# Patient Record
Sex: Female | Born: 1935 | Race: White | Hispanic: No | Smoking: Never smoker
Health system: Southern US, Community
[De-identification: ages and names within clinical notes are randomized; demographics above are authoritative.]

## PROBLEM LIST (undated history)

## (undated) DIAGNOSIS — R011 Cardiac murmur, unspecified: Secondary | ICD-10-CM

## (undated) DIAGNOSIS — J90 Pleural effusion, not elsewhere classified: Secondary | ICD-10-CM

## (undated) DIAGNOSIS — D649 Anemia, unspecified: Secondary | ICD-10-CM

## (undated) DIAGNOSIS — R0602 Shortness of breath: Secondary | ICD-10-CM

## (undated) DIAGNOSIS — I10 Essential (primary) hypertension: Secondary | ICD-10-CM

## (undated) DIAGNOSIS — I209 Angina pectoris, unspecified: Secondary | ICD-10-CM

## (undated) DIAGNOSIS — E785 Hyperlipidemia, unspecified: Secondary | ICD-10-CM

## (undated) DIAGNOSIS — M19049 Primary osteoarthritis, unspecified hand: Secondary | ICD-10-CM

## (undated) DIAGNOSIS — I2510 Atherosclerotic heart disease of native coronary artery without angina pectoris: Secondary | ICD-10-CM

## (undated) DIAGNOSIS — M8580 Other specified disorders of bone density and structure, unspecified site: Secondary | ICD-10-CM

## (undated) DIAGNOSIS — K219 Gastro-esophageal reflux disease without esophagitis: Secondary | ICD-10-CM

## (undated) HISTORY — DX: Anemia, unspecified: D64.9

## (undated) HISTORY — DX: Atherosclerotic heart disease of native coronary artery without angina pectoris: I25.10

## (undated) HISTORY — PX: BREAST CYST ASPIRATION: SHX578

## (undated) HISTORY — DX: Essential (primary) hypertension: I10

## (undated) HISTORY — DX: Other specified disorders of bone density and structure, unspecified site: M85.80

## (undated) HISTORY — PX: OTHER SURGICAL HISTORY: SHX169

## (undated) HISTORY — DX: Hyperlipidemia, unspecified: E78.5

## (undated) HISTORY — DX: Gastro-esophageal reflux disease without esophagitis: K21.9

## (undated) HISTORY — PX: CORONARY ANGIOPLASTY: SHX604

---

## 2000-06-10 HISTORY — PX: OTHER SURGICAL HISTORY: SHX169

## 2000-12-11 HISTORY — PX: COLONOSCOPY: SHX174

## 2002-10-11 HISTORY — PX: OTHER SURGICAL HISTORY: SHX169

## 2004-05-05 ENCOUNTER — Ambulatory Visit: Payer: Self-pay | Admitting: Family Medicine

## 2004-06-05 ENCOUNTER — Ambulatory Visit: Payer: Self-pay | Admitting: Family Medicine

## 2004-07-03 ENCOUNTER — Ambulatory Visit: Payer: Self-pay | Admitting: Family Medicine

## 2004-11-09 ENCOUNTER — Ambulatory Visit: Payer: Self-pay | Admitting: Family Medicine

## 2004-11-09 HISTORY — PX: OTHER SURGICAL HISTORY: SHX169

## 2004-12-25 ENCOUNTER — Ambulatory Visit: Payer: Self-pay | Admitting: Family Medicine

## 2004-12-28 ENCOUNTER — Ambulatory Visit: Payer: Self-pay | Admitting: Family Medicine

## 2005-01-12 ENCOUNTER — Ambulatory Visit: Payer: Self-pay | Admitting: Family Medicine

## 2005-01-19 ENCOUNTER — Ambulatory Visit: Payer: Self-pay | Admitting: Family Medicine

## 2005-02-18 ENCOUNTER — Ambulatory Visit: Payer: Self-pay | Admitting: Family Medicine

## 2005-03-15 ENCOUNTER — Ambulatory Visit: Payer: Self-pay | Admitting: Family Medicine

## 2005-03-23 ENCOUNTER — Ambulatory Visit: Payer: Self-pay | Admitting: Family Medicine

## 2005-08-17 ENCOUNTER — Ambulatory Visit: Payer: Self-pay | Admitting: Family Medicine

## 2005-09-16 ENCOUNTER — Ambulatory Visit: Payer: Self-pay | Admitting: Family Medicine

## 2005-10-27 ENCOUNTER — Ambulatory Visit: Payer: Self-pay | Admitting: Family Medicine

## 2005-12-14 ENCOUNTER — Ambulatory Visit: Payer: Self-pay | Admitting: Family Medicine

## 2005-12-16 ENCOUNTER — Ambulatory Visit: Payer: Self-pay | Admitting: Family Medicine

## 2006-01-27 ENCOUNTER — Ambulatory Visit: Payer: Self-pay | Admitting: Family Medicine

## 2006-03-29 ENCOUNTER — Ambulatory Visit: Payer: Self-pay | Admitting: Family Medicine

## 2006-03-31 ENCOUNTER — Ambulatory Visit: Payer: Self-pay | Admitting: Family Medicine

## 2006-07-04 ENCOUNTER — Ambulatory Visit: Payer: Self-pay | Admitting: Internal Medicine

## 2006-07-28 ENCOUNTER — Ambulatory Visit: Payer: Self-pay | Admitting: Family Medicine

## 2006-08-23 DIAGNOSIS — M899 Disorder of bone, unspecified: Secondary | ICD-10-CM | POA: Insufficient documentation

## 2006-08-23 DIAGNOSIS — M949 Disorder of cartilage, unspecified: Secondary | ICD-10-CM

## 2006-08-25 DIAGNOSIS — D649 Anemia, unspecified: Secondary | ICD-10-CM

## 2006-08-25 DIAGNOSIS — I10 Essential (primary) hypertension: Secondary | ICD-10-CM | POA: Insufficient documentation

## 2006-08-25 DIAGNOSIS — E785 Hyperlipidemia, unspecified: Secondary | ICD-10-CM | POA: Insufficient documentation

## 2006-08-25 DIAGNOSIS — M3500 Sicca syndrome, unspecified: Secondary | ICD-10-CM

## 2006-08-25 DIAGNOSIS — J309 Allergic rhinitis, unspecified: Secondary | ICD-10-CM | POA: Insufficient documentation

## 2006-08-25 DIAGNOSIS — I6529 Occlusion and stenosis of unspecified carotid artery: Secondary | ICD-10-CM | POA: Insufficient documentation

## 2006-08-26 ENCOUNTER — Ambulatory Visit: Payer: Self-pay | Admitting: Family Medicine

## 2006-08-26 LAB — CONVERTED CEMR LAB
Basophils Absolute: 0 10*3/uL (ref 0.0–0.1)
Basophils Relative: 0.4 % (ref 0.0–1.0)
Bilirubin, Direct: 0.1 mg/dL (ref 0.0–0.3)
CO2: 28 meq/L (ref 19–32)
Cholesterol: 160 mg/dL (ref 0–200)
Creatinine, Ser: 0.9 mg/dL (ref 0.4–1.2)
Glucose, Bld: 93 mg/dL (ref 70–99)
HCT: 34.2 % — ABNORMAL LOW (ref 36.0–46.0)
Hemoglobin: 11.9 g/dL — ABNORMAL LOW (ref 12.0–15.0)
LDL Cholesterol: 103 mg/dL — ABNORMAL HIGH (ref 0–99)
MCHC: 34.9 g/dL (ref 30.0–36.0)
Monocytes Absolute: 0.7 10*3/uL (ref 0.2–0.7)
Neutrophils Relative %: 51 % (ref 43.0–77.0)
Potassium: 4.2 meq/L (ref 3.5–5.1)
RDW: 13.8 % (ref 11.5–14.6)
Sed Rate: 46 mm/hr — ABNORMAL HIGH (ref 0–25)
Sodium: 142 meq/L (ref 135–145)
TSH: 4.45 microintl units/mL (ref 0.35–5.50)
Total Bilirubin: 0.5 mg/dL (ref 0.3–1.2)
Total Protein: 8.2 g/dL (ref 6.0–8.3)

## 2006-08-29 ENCOUNTER — Ambulatory Visit: Payer: Self-pay | Admitting: Family Medicine

## 2006-08-29 DIAGNOSIS — F411 Generalized anxiety disorder: Secondary | ICD-10-CM

## 2006-08-29 DIAGNOSIS — M329 Systemic lupus erythematosus, unspecified: Secondary | ICD-10-CM

## 2006-09-22 ENCOUNTER — Ambulatory Visit: Payer: Self-pay | Admitting: Family Medicine

## 2006-09-23 ENCOUNTER — Encounter (INDEPENDENT_AMBULATORY_CARE_PROVIDER_SITE_OTHER): Payer: Self-pay | Admitting: *Deleted

## 2006-10-24 ENCOUNTER — Ambulatory Visit: Payer: Self-pay | Admitting: Family Medicine

## 2006-10-24 LAB — CONVERTED CEMR LAB
Eosinophils Absolute: 0.1 10*3/uL (ref 0.0–0.6)
Folate: 9.6 ng/mL
Lymphocytes Relative: 34.2 % (ref 12.0–46.0)
MCV: 89.6 fL (ref 78.0–100.0)
Monocytes Absolute: 0.8 10*3/uL — ABNORMAL HIGH (ref 0.2–0.7)
Monocytes Relative: 10.8 % (ref 3.0–11.0)
Neutro Abs: 3.9 10*3/uL (ref 1.4–7.7)
Platelets: 180 10*3/uL (ref 150–400)
Vitamin B-12: 414 pg/mL (ref 211–911)

## 2006-11-23 ENCOUNTER — Encounter: Payer: Self-pay | Admitting: Internal Medicine

## 2006-12-06 ENCOUNTER — Telehealth: Payer: Self-pay | Admitting: Family Medicine

## 2006-12-30 ENCOUNTER — Encounter: Payer: Self-pay | Admitting: Family Medicine

## 2007-02-03 ENCOUNTER — Ambulatory Visit: Payer: Self-pay | Admitting: Family Medicine

## 2007-03-01 ENCOUNTER — Ambulatory Visit: Payer: Self-pay | Admitting: Family Medicine

## 2007-04-20 ENCOUNTER — Ambulatory Visit: Payer: Self-pay | Admitting: Family Medicine

## 2007-04-20 DIAGNOSIS — K219 Gastro-esophageal reflux disease without esophagitis: Secondary | ICD-10-CM | POA: Insufficient documentation

## 2007-05-12 ENCOUNTER — Ambulatory Visit: Payer: Self-pay | Admitting: Family Medicine

## 2007-05-25 ENCOUNTER — Ambulatory Visit: Payer: Self-pay | Admitting: Family Medicine

## 2007-08-01 ENCOUNTER — Ambulatory Visit: Payer: Self-pay | Admitting: Internal Medicine

## 2007-08-17 ENCOUNTER — Ambulatory Visit: Payer: Self-pay | Admitting: Family Medicine

## 2007-09-07 ENCOUNTER — Ambulatory Visit: Payer: Self-pay | Admitting: Family Medicine

## 2007-09-07 LAB — CONVERTED CEMR LAB
ALT: 10 units/L (ref 0–35)
AST: 22 units/L (ref 0–37)
Alkaline Phosphatase: 82 units/L (ref 39–117)
Basophils Absolute: 0 10*3/uL (ref 0.0–0.1)
Bilirubin, Direct: 0.1 mg/dL (ref 0.0–0.3)
CO2: 27 meq/L (ref 19–32)
Calcium: 9.2 mg/dL (ref 8.4–10.5)
Chloride: 107 meq/L (ref 96–112)
Glucose, Bld: 87 mg/dL (ref 70–99)
Hemoglobin: 9.8 g/dL — ABNORMAL LOW (ref 12.0–15.0)
LDL Cholesterol: 107 mg/dL — ABNORMAL HIGH (ref 0–99)
Lymphocytes Relative: 38 % (ref 12.0–46.0)
Monocytes Relative: 12.1 % — ABNORMAL HIGH (ref 3.0–12.0)
Neutro Abs: 2.2 10*3/uL (ref 1.4–7.7)
Neutrophils Relative %: 46.6 % (ref 43.0–77.0)
Potassium: 4.3 meq/L (ref 3.5–5.1)
RDW: 14.3 % (ref 11.5–14.6)
Sodium: 140 meq/L (ref 135–145)
Total Bilirubin: 0.4 mg/dL (ref 0.3–1.2)
Total CHOL/HDL Ratio: 7.1
Total Protein: 8.1 g/dL (ref 6.0–8.3)
Triglycerides: 94 mg/dL (ref 0–149)
VLDL: 19 mg/dL (ref 0–40)

## 2007-09-13 ENCOUNTER — Encounter: Payer: Self-pay | Admitting: Family Medicine

## 2007-09-13 ENCOUNTER — Ambulatory Visit: Payer: Self-pay | Admitting: Family Medicine

## 2007-09-13 ENCOUNTER — Other Ambulatory Visit: Admission: RE | Admit: 2007-09-13 | Discharge: 2007-09-13 | Payer: Self-pay | Admitting: Family Medicine

## 2007-09-13 DIAGNOSIS — D126 Benign neoplasm of colon, unspecified: Secondary | ICD-10-CM | POA: Insufficient documentation

## 2007-09-18 ENCOUNTER — Encounter (INDEPENDENT_AMBULATORY_CARE_PROVIDER_SITE_OTHER): Payer: Self-pay | Admitting: *Deleted

## 2007-10-04 ENCOUNTER — Encounter: Payer: Self-pay | Admitting: Family Medicine

## 2007-10-09 ENCOUNTER — Ambulatory Visit: Payer: Self-pay | Admitting: Family Medicine

## 2007-10-09 LAB — CONVERTED CEMR LAB: OCCULT 3: NEGATIVE

## 2007-10-10 ENCOUNTER — Encounter (INDEPENDENT_AMBULATORY_CARE_PROVIDER_SITE_OTHER): Payer: Self-pay | Admitting: *Deleted

## 2007-10-11 ENCOUNTER — Ambulatory Visit: Payer: Self-pay | Admitting: Oncology

## 2007-11-11 ENCOUNTER — Ambulatory Visit: Payer: Self-pay | Admitting: Oncology

## 2007-11-14 ENCOUNTER — Telehealth: Payer: Self-pay | Admitting: Family Medicine

## 2007-11-27 ENCOUNTER — Encounter: Payer: Self-pay | Admitting: Family Medicine

## 2007-12-12 ENCOUNTER — Ambulatory Visit: Payer: Self-pay | Admitting: Oncology

## 2008-01-22 ENCOUNTER — Telehealth: Payer: Self-pay | Admitting: Family Medicine

## 2008-01-23 ENCOUNTER — Encounter: Payer: Self-pay | Admitting: Family Medicine

## 2008-02-11 ENCOUNTER — Ambulatory Visit: Payer: Self-pay | Admitting: Oncology

## 2008-02-28 ENCOUNTER — Ambulatory Visit: Payer: Self-pay | Admitting: Oncology

## 2008-03-12 ENCOUNTER — Ambulatory Visit: Payer: Self-pay | Admitting: Oncology

## 2008-03-19 ENCOUNTER — Ambulatory Visit: Payer: Self-pay | Admitting: Family Medicine

## 2008-03-20 LAB — CONVERTED CEMR LAB: Vit D, 1,25-Dihydroxy: 41 (ref 30–89)

## 2008-03-21 ENCOUNTER — Ambulatory Visit: Payer: Self-pay | Admitting: Family Medicine

## 2008-05-16 ENCOUNTER — Ambulatory Visit: Payer: Self-pay | Admitting: Family Medicine

## 2008-05-16 DIAGNOSIS — R42 Dizziness and giddiness: Secondary | ICD-10-CM

## 2008-09-10 ENCOUNTER — Ambulatory Visit: Payer: Self-pay | Admitting: Oncology

## 2008-09-18 ENCOUNTER — Ambulatory Visit: Payer: Self-pay | Admitting: Oncology

## 2008-09-18 ENCOUNTER — Encounter: Payer: Self-pay | Admitting: Family Medicine

## 2008-10-09 ENCOUNTER — Telehealth (INDEPENDENT_AMBULATORY_CARE_PROVIDER_SITE_OTHER): Payer: Self-pay | Admitting: *Deleted

## 2008-10-10 ENCOUNTER — Ambulatory Visit: Payer: Self-pay | Admitting: Oncology

## 2008-10-15 ENCOUNTER — Ambulatory Visit: Payer: Self-pay | Admitting: Family Medicine

## 2008-10-15 LAB — CONVERTED CEMR LAB
Albumin: 3.8 g/dL (ref 3.5–5.2)
Alkaline Phosphatase: 56 units/L (ref 39–117)
BUN: 14 mg/dL (ref 6–23)
Basophils Absolute: 0 10*3/uL (ref 0.0–0.1)
CO2: 30 meq/L (ref 19–32)
Calcium: 9 mg/dL (ref 8.4–10.5)
Cholesterol: 164 mg/dL (ref 0–200)
Eosinophils Absolute: 0.1 10*3/uL (ref 0.0–0.7)
GFR calc non Af Amer: 72.79 mL/min (ref >60)
Glucose, Bld: 99 mg/dL (ref 70–99)
HDL: 37.5 mg/dL — ABNORMAL LOW (ref >39.00)
Hemoglobin: 12.7 g/dL (ref 12.0–15.0)
Iron: 78 ug/dL (ref 42–145)
Lymphocytes Relative: 36.1 % (ref 12.0–46.0)
MCHC: 34.8 g/dL (ref 30.0–36.0)
Magnesium: 2.3 mg/dL (ref 1.5–2.5)
Monocytes Relative: 11.6 % (ref 3.0–12.0)
Neutro Abs: 2.3 10*3/uL (ref 1.4–7.7)
Neutrophils Relative %: 50.1 % (ref 43.0–77.0)
RBC: 3.93 M/uL (ref 3.87–5.11)
RDW: 12.9 % (ref 11.5–14.6)
Sed Rate: 31 mm/hr — ABNORMAL HIGH (ref 0–22)
TSH: 3.92 microintl units/mL (ref 0.35–5.50)
Total Protein: 7.7 g/dL (ref 6.0–8.3)
Triglycerides: 104 mg/dL (ref 0.0–149.0)
VLDL: 20.8 mg/dL (ref 0.0–40.0)

## 2008-10-22 ENCOUNTER — Ambulatory Visit: Payer: Self-pay | Admitting: Family Medicine

## 2008-11-15 ENCOUNTER — Ambulatory Visit: Payer: Self-pay | Admitting: Family Medicine

## 2008-11-18 ENCOUNTER — Encounter (INDEPENDENT_AMBULATORY_CARE_PROVIDER_SITE_OTHER): Payer: Self-pay | Admitting: *Deleted

## 2009-01-21 ENCOUNTER — Encounter: Payer: Self-pay | Admitting: Family Medicine

## 2009-02-26 ENCOUNTER — Telehealth: Payer: Self-pay | Admitting: Family Medicine

## 2009-03-12 ENCOUNTER — Telehealth: Payer: Self-pay | Admitting: Family Medicine

## 2009-05-07 ENCOUNTER — Ambulatory Visit: Payer: Self-pay | Admitting: Family Medicine

## 2009-05-07 DIAGNOSIS — M545 Low back pain, unspecified: Secondary | ICD-10-CM | POA: Insufficient documentation

## 2009-05-07 DIAGNOSIS — M79609 Pain in unspecified limb: Secondary | ICD-10-CM

## 2009-05-07 DIAGNOSIS — K5900 Constipation, unspecified: Secondary | ICD-10-CM | POA: Insufficient documentation

## 2009-06-24 ENCOUNTER — Ambulatory Visit: Payer: Self-pay | Admitting: Family Medicine

## 2009-11-13 ENCOUNTER — Encounter (INDEPENDENT_AMBULATORY_CARE_PROVIDER_SITE_OTHER): Payer: Self-pay | Admitting: *Deleted

## 2010-05-12 NOTE — Assessment & Plan Note (Signed)
Summary: SORE SPOT ON LEG   Vital Signs:  Patient profile:   77 year old female Weight:      121.75 pounds Temp:     98.1 degrees F oral Pulse rate:   72 / minute Pulse rhythm:   regular BP sitting:   138 / 80  (left arm) Cuff size:   regular  Vitals Entered By: Sydell Axon LPN (June 24, 2009 2:17 PM) CC: Hit right leg on nightstand about 6 weeks ago, has sore spot on leg now and it hurts to walk   History of Present Illness: Pt here for having hit her right thigh 6 weeks ago hard on the edge of her nightstand causing bruising and sweelling of the area. She is now having discomfort, enough to preclude her doing her regular walking. She has no swelling of the leg per se and the lump has gone down but still bothers her, especially at night.  Problems Prior to Update: 1)  Constipation, Mild  (ICD-564.00) 2)  Leg Pain, Bilateral  (ICD-729.5) 3)  Back Pain, Lumbar  (ICD-724.2) 4)  Other Screening Mammogram  (ICD-V76.12) 5)  Vertigo  (ICD-780.4) 6)  Colonic Polyps, Adenomatous  (ICD-211.3) 7)  Gerd  (ICD-530.81) 8)  Screening For Malignannt Neoplasm, Site Nec  (ICD-V76.49) 9)  Anxiety Disorder, Generalized  (ICD-300.02) 10)  Sjogren's Syndrome  (ICD-710.2) 11)  Lupus Erythematosus, Discoid (CUTANEOUS)  (ICD-695.4) 12)  Systemic Lupus Erythematosus  (ICD-710.0) 13)  Raynaud's Disease (OLD RECORDS)  (ICD-443.0) 14)  Osteopenia  (ICD-733.90) 15)  Hypertension  (ICD-401.9) 16)  Hyperlipidemia  (ICD-272.4) 17)  Carotid Artery Disease  (ICD-433.10) 18)  Anemia-nos  (ICD-285.9) 19)  Allergic Rhinitis  (ICD-477.9)  Medications Prior to Update: 1)  Aspirin 325 Mg Tabs (Aspirin) .... Take One By Mouth Daily 2)  Norvasc 5 Mg Tabs (Amlodipine Besylate) .... Take One By Mouth Daily 3)  Pravachol 80 Mg Tabs (Pravastatin Sodium) .... Take One By Mouth At Bedtime 4)  Alprazolam 0.25 Mg Tabs (Alprazolam) .... Take One Half Tablet By Mouth Daily As Needed For Anxiety 5)  Meclizine Hcl 25 Mg  Chew (Meclizine Hcl) .... 1/2-1 Tab By Mouth Every 6 Hrs As Needed Dizziness.  Allergies: 1)  ! Zocor (Simvastatin) 2)  Fosamax (Alendronate Sodium)  Physical Exam  General:  Well-developed,well-nourished,in no acute distress; alert,appropriate and cooperative throughout examination Head:  Normocephalic and atraumatic without obvious abnormalities. No apparent alopecia or balding. Eyes:  Conjunctiva clear bilaterally. PERRLA<, EOMI, Vertical gaze causes dizziness, no obvious nystagmus noted. Horizontal gaze does not cause. Ears:  External ear exam shows no significant lesions or deformities.  Otoscopic examination reveals clear canals, tympanic membranes are intact bilaterally without bulging, retraction, inflammation or discharge. Hearing is grossly normal bilaterally. Nose:  External nasal examination shows no deformity or inflammation. Nasal mucosa are pink and moist without lesions or exudates. Mouth:  Oral mucosa and oropharynx without lesions or exudates.  Teeth in good repair. Neck:  No deformities, masses, or tenderness noted. Lungs:  Normal respiratory effort, chest expands symmetrically. Lungs are clear to auscultation, no crackles or wheezes. Heart:  Normal rate and regular rhythm. S1 and S2 normal without gallop, murmur, click, rub or other extra sounds. Extremities:  Right ant thigh signif for soft 1x3cm soft tissue density that is extremely tender to palpation. No swelling of the leg. ROM nml.   Impression & Recommendations:  Problem # 1:  CONTUSION, RIGHT ANTERIOR THIGH (ICD-924.5) Assessment New Feels to be resolving hematoma.  Apply heat as  often as poss. Start walking again regularly as able.  Tyl ES, 2 tabs by mouth AM and before bed, may add third time if needed. Apply icy hot when not using heat. RTC if doesn't cont to improve for XRay.  Complete Medication List: 1)  Aspirin 325 Mg Tabs (Aspirin) .... Take one by mouth daily 2)  Norvasc 5 Mg Tabs (Amlodipine  besylate) .... Take one by mouth daily 3)  Pravachol 80 Mg Tabs (Pravastatin sodium) .... Take one by mouth at bedtime 4)  Alprazolam 0.25 Mg Tabs (Alprazolam) .... Take one half tablet by mouth daily as needed for anxiety 5)  Meclizine Hcl 25 Mg Chew (Meclizine hcl) .... 1/2-1 tab by mouth every 6 hrs as needed dizziness.  Current Allergies (reviewed today): ! ZOCOR (SIMVASTATIN) FOSAMAX (ALENDRONATE SODIUM)

## 2010-05-12 NOTE — Assessment & Plan Note (Signed)
Summary: ? ABOUT CHOL MEDS LEG PAIN/RBH   Vital Signs:  Patient profile:   77 year old female Weight:      120 pounds Temp:     98.2 degrees F oral Pulse rate:   72 / minute Pulse rhythm:   regular BP sitting:   122 / 78  (left arm) Cuff size:   regular  Vitals Entered By: Sydell Axon LPN (May 07, 2009 2:02 PM) CC: ? About cholesterol medication may be causing leg pain   CC:  ? About cholesterol medication may be causing leg pain.  History of Present Illness: Pt has recent onset LBP across from right to left laterally at the level of the sacrum, in the distal posterior upper legs and in the ant prox lower legs in the last month. She has arthritis but is also on Pravastatin and wonders what could be causing her discomfort. From the last two locations of the three, I doubt it to be arthritis but is possible.  She also has had acute episode of constipation and used Prune juice with success this AM. She wondered if this was a problem and are there other products I would suggest.  Problems Prior to Update: 1)  Other Screening Mammogram  (ICD-V76.12) 2)  Vertigo  (ICD-780.4) 3)  Colonic Polyps, Adenomatous  (ICD-211.3) 4)  Gerd  (ICD-530.81) 5)  Screening For Malignannt Neoplasm, Site Nec  (ICD-V76.49) 6)  Anxiety Disorder, Generalized  (ICD-300.02) 7)  Sjogren's Syndrome  (ICD-710.2) 8)  Lupus Erythematosus, Discoid (CUTANEOUS)  (ICD-695.4) 9)  Systemic Lupus Erythematosus  (ICD-710.0) 10)  Raynaud's Disease (OLD RECORDS)  (ICD-443.0) 11)  Osteopenia  (ICD-733.90) 12)  Hypertension  (ICD-401.9) 13)  Hyperlipidemia  (ICD-272.4) 14)  Carotid Artery Disease  (ICD-433.10) 15)  Anemia-nos  (ICD-285.9) 16)  Allergic Rhinitis  (ICD-477.9)  Medications Prior to Update: 1)  Aspirin 325 Mg Tabs (Aspirin) .... Take One By Mouth Daily 2)  Norvasc 5 Mg Tabs (Amlodipine Besylate) .... Take One By Mouth Daily 3)  Pravachol 80 Mg Tabs (Pravastatin Sodium) .... Take One By Mouth Q Hs 4)   Alprazolam 0.25 Mg Tabs (Alprazolam) .... Take One Half Tablet By Mouth Daily As Needed For Anxiety 5)  Meclizine Hcl 25 Mg Chew (Meclizine Hcl) .... 1/2-1 Tab By Mouth Every 6 Hrs As Needed Dizziness.  Allergies: 1)  ! Zocor (Simvastatin) 2)  Fosamax (Alendronate Sodium)  Physical Exam  General:  Well-developed,well-nourished,in no acute distress; alert,appropriate and cooperative throughout examination Head:  Normocephalic and atraumatic without obvious abnormalities. No apparent alopecia or balding. Eyes:  Conjunctiva clear bilaterally. PERRLA<, EOMI, Vertical gaze causes dizziness, no obvious nystagmus noted. Horizontal gaze does not cause. Ears:  External ear exam shows no significant lesions or deformities.  Otoscopic examination reveals clear canals, tympanic membranes are intact bilaterally without bulging, retraction, inflammation or discharge. Hearing is grossly normal bilaterally. Nose:  External nasal examination shows no deformity or inflammation. Nasal mucosa are pink and moist without lesions or exudates. Mouth:  Oral mucosa and oropharynx without lesions or exudates.  Teeth in good repair. Msk:  ROM of waist, giat and up and out of chair all reasonably nml.   Impression & Recommendations:  Problem # 1:  BACK PAIN, LUMBAR (ICD-724.2) Assessment New  See below Her updated medication list for this problem includes:    Aspirin 325 Mg Tabs (Aspirin) .Marland Kitchen... Take one by mouth daily  Problem # 2:  LEG PAIN, BILATERAL (ICD-729.5) Assessment: New Might be Prava side effect. Stop Prava  fort one month. If pain resolves, come in to discuss changing med or restarting Prave w/ Co Q 10. If does not change, probably arthritis of some form and would restart Prava.  Problem # 3:  CONSTIPATION, MILD (ICD-564.00) Assessment: New Prune Juice fine as long as not used regularly. If happens frequently, would start regular fiber supplementation daily...suggest Citrucel w/ 8 oz  water.  Complete Medication List: 1)  Aspirin 325 Mg Tabs (Aspirin) .... Take one by mouth daily 2)  Norvasc 5 Mg Tabs (Amlodipine besylate) .... Take one by mouth daily 3)  Pravachol 80 Mg Tabs (Pravastatin sodium) .... Take one by mouth at bedtime 4)  Alprazolam 0.25 Mg Tabs (Alprazolam) .... Take one half tablet by mouth daily as needed for anxiety 5)  Meclizine Hcl 25 Mg Chew (Meclizine hcl) .... 1/2-1 tab by mouth every 6 hrs as needed dizziness.  Patient Instructions: 1)  RTC as discussed.  Current Allergies (reviewed today): ! ZOCOR (SIMVASTATIN) FOSAMAX (ALENDRONATE SODIUM)

## 2010-05-12 NOTE — Letter (Signed)
Summary: Nadara Eaton letter  [place] at [facility]  [address] [place], [place] [address]   Phone: [phone]  Fax: [phone]       11/13/2009 MRN: [id number]  [facility] [address] [place], [place]  [address]  Dear Ms. Geathers,  [facility] [place], and [place] announce the retirement of Arta Silence, M.D., from full-time practice at the [facility] office effective October 09, 2009 and his plans of returning part-time.  It is important to Dr. Hetty Ely and to our practice that you understand that [facility] has seven physicians in our office for your health care needs.  We will continue to offer the same exceptional care that you have today.    Dr. Hetty Ely has spoken to many of you about his plans for retirement and returning part-time in the fall.   We will continue to work with you through the transition to schedule appointments for you in the office and meet the high standards that [place] is committed to.   Again, it is with great pleasure that we share the news that Dr. Hetty Ely will return to [facility] in October of 2011 with a reduced schedule.    If you have any questions, or would like to request an appointment with one of our physicians, please call us at [phone] and press the option for Scheduling an appointment.  We take pleasure in providing you with excellent patient care and look forward to seeing you at your next office visit.  Our [facility] Physicians are:  Tillman Abide, M.D. Laurita Quint, M.D. Roxy Manns, M.D. Kerby Nora, M.D. Hannah Beat, M.D. Ruthe Mannan, M.D. We proudly welcomed Raechel Ache, M.D. and Eustaquio Boyden, M.D. to the practice in July/August 2011.  Sincerely,  [facility]

## 2010-05-12 NOTE — Letter (Signed)
Summary: Schaller Retirement letter  [facility] [place]  [address]   [place], [place] [address]   Phone: [phone]  Fax: [phone]       11/13/2009 MRN: [id number]  [facility] Herbold [address] [place], [place]  [address]  Dear Ms. Ovens,  [facility] [place], and [place] announce the retirement of ROBERT N. SCHALLER, M.D., from full-time practice at the [place] office effective October 09, 2009 and his plans of returning part-time.  It is important to Dr. Schaller and to our practice that you understand that [facility] [place] has seven physicians in our office for your health care needs.  We will continue to offer the same exceptional care that you have today.    Dr. Schaller has spoken to many of you about his plans for retirement and returning part-time in the fall.   We will continue to work with you through the transition to schedule appointments for you in the office and meet the high standards that [facility] is committed to.   Again, it is with great pleasure that we share the news that Dr. Schaller will return to [facility] [place] in October of 2011 with a reduced schedule.    If you have any questions, or would like to request an appointment with one of our physicians, please call us at [phone] and press the option for Scheduling an appointment.  We take pleasure in providing you with excellent patient care and look forward to seeing you at your next office visit.  Our [place] Physicians are:  Richard Letvak, M.D. Robert Schaller, M.D. Marne Tower, M.D. Amy Bedsole, M.D. Spencer Copland, M.D. Talia Aron, M.D. We proudly welcomed Shaw Duncan, M.D. and Javier Gutierrez, M.D. to the practice in July/August 2011.  Sincerely,  [facility] [place] 

## 2010-11-24 ENCOUNTER — Other Ambulatory Visit: Payer: Self-pay | Admitting: *Deleted

## 2010-11-24 MED ORDER — AMLODIPINE BESYLATE 5 MG PO TABS: 5.0000 mg | ORAL_TABLET | Freq: Every day | ORAL | Status: DC

## 2011-03-31 ENCOUNTER — Observation Stay: Payer: Self-pay | Admitting: Internal Medicine

## 2011-04-01 ENCOUNTER — Encounter: Payer: Self-pay | Admitting: Family Medicine

## 2011-04-02 ENCOUNTER — Emergency Department: Payer: Self-pay | Admitting: Emergency Medicine

## 2011-04-07 ENCOUNTER — Ambulatory Visit: Payer: Self-pay | Admitting: Family Medicine

## 2011-04-07 DIAGNOSIS — Z0289 Encounter for other administrative examinations: Secondary | ICD-10-CM

## 2011-07-06 ENCOUNTER — Emergency Department: Payer: Self-pay | Admitting: Emergency Medicine

## 2011-07-06 LAB — CBC
MCHC: 33.5 g/dL (ref 32.0–36.0)
MCV: 91 fL (ref 80–100)
WBC: 2.7 10*3/uL — ABNORMAL LOW (ref 3.6–11.0)

## 2011-07-06 LAB — COMPREHENSIVE METABOLIC PANEL
BUN: 22 mg/dL — ABNORMAL HIGH (ref 7–18)
Calcium, Total: 8.6 mg/dL (ref 8.5–10.1)
Co2: 22 mmol/L (ref 21–32)
Glucose: 104 mg/dL — ABNORMAL HIGH (ref 65–99)
SGPT (ALT): 36 U/L
Total Protein: 8.4 g/dL — ABNORMAL HIGH (ref 6.4–8.2)

## 2011-07-06 LAB — URINALYSIS, COMPLETE
Blood: NEGATIVE
Ph: 6 (ref 4.5–8.0)
Squamous Epithelial: <1
WBC UR: 2 /HPF (ref 0–5)

## 2012-06-01 ENCOUNTER — Ambulatory Visit: Payer: Self-pay | Admitting: Cardiology

## 2012-06-06 ENCOUNTER — Encounter (HOSPITAL_COMMUNITY): Payer: Self-pay | Admitting: General Practice

## 2012-06-06 ENCOUNTER — Inpatient Hospital Stay (HOSPITAL_COMMUNITY): Payer: Medicare Other

## 2012-06-06 ENCOUNTER — Observation Stay: Payer: Self-pay | Admitting: Internal Medicine

## 2012-06-06 ENCOUNTER — Inpatient Hospital Stay (HOSPITAL_COMMUNITY)
Admission: AD | Admit: 2012-06-06 | Discharge: 2012-06-14 | DRG: 236 | Disposition: A | Discharge status: Home / Self Care | Payer: Medicare Other | Source: Other Acute Inpatient Hospital | Attending: Thoracic Surgery (Cardiothoracic Vascular Surgery) | Admitting: Thoracic Surgery (Cardiothoracic Vascular Surgery)

## 2012-06-06 ENCOUNTER — Other Ambulatory Visit: Payer: Self-pay | Admitting: *Deleted

## 2012-06-06 DIAGNOSIS — M899 Disorder of bone, unspecified: Secondary | ICD-10-CM | POA: Diagnosis present

## 2012-06-06 DIAGNOSIS — I2510 Atherosclerotic heart disease of native coronary artery without angina pectoris: Principal | ICD-10-CM | POA: Diagnosis present

## 2012-06-06 DIAGNOSIS — F05 Delirium due to known physiological condition: Secondary | ICD-10-CM | POA: Diagnosis not present

## 2012-06-06 DIAGNOSIS — K5900 Constipation, unspecified: Secondary | ICD-10-CM | POA: Diagnosis present

## 2012-06-06 DIAGNOSIS — N189 Chronic kidney disease, unspecified: Secondary | ICD-10-CM | POA: Diagnosis present

## 2012-06-06 DIAGNOSIS — I519 Heart disease, unspecified: Secondary | ICD-10-CM | POA: Diagnosis not present

## 2012-06-06 DIAGNOSIS — Z79899 Other long term (current) drug therapy: Secondary | ICD-10-CM

## 2012-06-06 DIAGNOSIS — J9819 Other pulmonary collapse: Secondary | ICD-10-CM | POA: Diagnosis not present

## 2012-06-06 DIAGNOSIS — E876 Hypokalemia: Secondary | ICD-10-CM | POA: Diagnosis not present

## 2012-06-06 DIAGNOSIS — I129 Hypertensive chronic kidney disease with stage 1 through stage 4 chronic kidney disease, or unspecified chronic kidney disease: Secondary | ICD-10-CM | POA: Diagnosis present

## 2012-06-06 DIAGNOSIS — D62 Acute posthemorrhagic anemia: Secondary | ICD-10-CM | POA: Diagnosis not present

## 2012-06-06 DIAGNOSIS — M329 Systemic lupus erythematosus, unspecified: Secondary | ICD-10-CM | POA: Diagnosis present

## 2012-06-06 DIAGNOSIS — I200 Unstable angina: Secondary | ICD-10-CM | POA: Diagnosis present

## 2012-06-06 DIAGNOSIS — I6529 Occlusion and stenosis of unspecified carotid artery: Secondary | ICD-10-CM | POA: Diagnosis present

## 2012-06-06 DIAGNOSIS — E785 Hyperlipidemia, unspecified: Secondary | ICD-10-CM | POA: Diagnosis present

## 2012-06-06 DIAGNOSIS — Z7982 Long term (current) use of aspirin: Secondary | ICD-10-CM

## 2012-06-06 DIAGNOSIS — Y832 Surgical operation with anastomosis, bypass or graft as the cause of abnormal reaction of the patient, or of later complication, without mention of misadventure at the time of the procedure: Secondary | ICD-10-CM | POA: Diagnosis present

## 2012-06-06 DIAGNOSIS — F411 Generalized anxiety disorder: Secondary | ICD-10-CM | POA: Diagnosis present

## 2012-06-06 DIAGNOSIS — IMO0002 Reserved for concepts with insufficient information to code with codable children: Secondary | ICD-10-CM | POA: Diagnosis not present

## 2012-06-06 DIAGNOSIS — N289 Disorder of kidney and ureter, unspecified: Secondary | ICD-10-CM | POA: Diagnosis not present

## 2012-06-06 DIAGNOSIS — K219 Gastro-esophageal reflux disease without esophagitis: Secondary | ICD-10-CM | POA: Diagnosis present

## 2012-06-06 DIAGNOSIS — Z951 Presence of aortocoronary bypass graft: Secondary | ICD-10-CM

## 2012-06-06 DIAGNOSIS — M3500 Sicca syndrome, unspecified: Secondary | ICD-10-CM | POA: Diagnosis present

## 2012-06-06 DIAGNOSIS — J988 Other specified respiratory disorders: Secondary | ICD-10-CM | POA: Diagnosis not present

## 2012-06-06 DIAGNOSIS — Z9861 Coronary angioplasty status: Secondary | ICD-10-CM

## 2012-06-06 DIAGNOSIS — E8779 Other fluid overload: Secondary | ICD-10-CM | POA: Diagnosis not present

## 2012-06-06 DIAGNOSIS — Z8249 Family history of ischemic heart disease and other diseases of the circulatory system: Secondary | ICD-10-CM

## 2012-06-06 DIAGNOSIS — D696 Thrombocytopenia, unspecified: Secondary | ICD-10-CM | POA: Diagnosis not present

## 2012-06-06 DIAGNOSIS — I4891 Unspecified atrial fibrillation: Secondary | ICD-10-CM | POA: Diagnosis not present

## 2012-06-06 DIAGNOSIS — Y9210 Unspecified residential institution as the place of occurrence of the external cause: Secondary | ICD-10-CM | POA: Diagnosis present

## 2012-06-06 DIAGNOSIS — D126 Benign neoplasm of colon, unspecified: Secondary | ICD-10-CM | POA: Diagnosis present

## 2012-06-06 DIAGNOSIS — J309 Allergic rhinitis, unspecified: Secondary | ICD-10-CM | POA: Diagnosis present

## 2012-06-06 HISTORY — DX: Angina pectoris, unspecified: I20.9

## 2012-06-06 HISTORY — DX: Cardiac murmur, unspecified: R01.1

## 2012-06-06 HISTORY — DX: Shortness of breath: R06.02

## 2012-06-06 LAB — TROPONIN I
Troponin-I: 0.05 ng/mL
Troponin-I: <0.02 ng/mL

## 2012-06-06 LAB — URINALYSIS, COMPLETE
Blood: NEGATIVE
Glucose,UR: NEGATIVE mg/dL (ref 0–75)
Ketone: NEGATIVE
Nitrite: NEGATIVE
Protein: NEGATIVE
RBC,UR: 1 /HPF (ref 0–5)
WBC UR: 4 /HPF (ref 0–5)

## 2012-06-06 LAB — FERRITIN: Ferritin (ARMC): 48 ng/mL (ref 8–388)

## 2012-06-06 LAB — CBC
HCT: 26.1 % — ABNORMAL LOW (ref 35.0–47.0)
MCH: 29.1 pg (ref 26.0–34.0)
MCHC: 31.8 g/dL — ABNORMAL LOW (ref 32.0–36.0)
MCHC: 33.6 g/dL (ref 30.0–36.0)
Platelet: 167 10*3/uL (ref 150–440)
Platelets: 217 10*3/uL (ref 150–400)
RBC: 2.85 10*6/uL — ABNORMAL LOW (ref 3.80–5.20)
RDW: 13.6 % (ref 11.5–15.5)
RDW: 13.9 % (ref 11.5–14.5)
WBC: 7.3 10*3/uL (ref 3.6–11.0)
WBC: 8.1 10*3/uL (ref 4.0–10.5)

## 2012-06-06 LAB — URINALYSIS, ROUTINE W REFLEX MICROSCOPIC
Bilirubin Urine: NEGATIVE
Glucose, UA: NEGATIVE mg/dL
Hgb urine dipstick: NEGATIVE
Ketones, ur: 15 mg/dL — AB
Protein, ur: NEGATIVE mg/dL

## 2012-06-06 LAB — CK TOTAL AND CKMB (NOT AT ARMC)
CK, Total: 24 U/L (ref 21–215)
CK-MB: 0.6 ng/mL (ref 0.5–3.6)
CK-MB: 0.7 ng/mL (ref 0.5–3.6)

## 2012-06-06 LAB — BASIC METABOLIC PANEL
Anion Gap: 6 — ABNORMAL LOW (ref 7–16)
BUN: 22 mg/dL — ABNORMAL HIGH (ref 7–18)
Chloride: 111 mmol/L — ABNORMAL HIGH (ref 98–107)
Co2: 23 mmol/L (ref 21–32)
Creatinine: 1.56 mg/dL — ABNORMAL HIGH (ref 0.60–1.30)
EGFR (African American): 35 — ABNORMAL LOW
EGFR (Non-African Amer.): 30 — ABNORMAL LOW
Osmolality: 282 (ref 275–301)
Sodium: 140 mmol/L (ref 136–145)

## 2012-06-06 LAB — COMPREHENSIVE METABOLIC PANEL
ALT: 9 U/L (ref 0–35)
Alkaline Phosphatase: 70 U/L (ref 50–136)
Alkaline Phosphatase: 73 U/L (ref 39–117)
BUN: 19 mg/dL (ref 6–23)
BUN: 26 mg/dL — ABNORMAL HIGH (ref 7–18)
CO2: 20 mEq/L (ref 19–32)
Calcium, Total: 7.9 mg/dL — ABNORMAL LOW (ref 8.5–10.1)
Chloride: 110 mmol/L — ABNORMAL HIGH (ref 98–107)
Co2: 21 mmol/L (ref 21–32)
Creatinine: 1.74 mg/dL — ABNORMAL HIGH (ref 0.60–1.30)
EGFR (African American): 30 — ABNORMAL LOW
GFR calc Af Amer: 41 mL/min — ABNORMAL LOW (ref >90)
GFR calc non Af Amer: 36 mL/min — ABNORMAL LOW (ref >90)
Glucose, Bld: 105 mg/dL — ABNORMAL HIGH (ref 70–99)
Potassium: 3.7 mEq/L (ref 3.5–5.1)
Potassium: 4 mmol/L (ref 3.5–5.1)
SGOT(AST): 26 U/L (ref 15–37)
SGPT (ALT): 15 U/L (ref 12–78)
Sodium: 137 mEq/L (ref 135–145)
Sodium: 139 mmol/L (ref 136–145)
Total Bilirubin: 0.6 mg/dL (ref 0.3–1.2)
Total Protein: 6.9 g/dL (ref 6.4–8.2)

## 2012-06-06 LAB — PROTIME-INR
INR: 1.05 (ref 0.00–1.49)
Prothrombin Time: 13.3 secs (ref 11.5–14.7)
Prothrombin Time: 13.6 seconds (ref 11.6–15.2)

## 2012-06-06 LAB — PRO B NATRIURETIC PEPTIDE: B-Type Natriuretic Peptide: 3281 pg/mL — ABNORMAL HIGH (ref 0–450)

## 2012-06-06 LAB — IRON AND TIBC
Iron: 41 ug/dL — ABNORMAL LOW (ref 50–170)
Unbound Iron-Bind.Cap.: 207 ug/dL

## 2012-06-06 LAB — SURGICAL PCR SCREEN: MRSA, PCR: NEGATIVE

## 2012-06-06 MED ORDER — SODIUM CHLORIDE 0.9 % IJ SOLN
3.0000 mL | Freq: Two times a day (BID) | INTRAMUSCULAR | Status: DC
  Administered 2012-06-07 (×2): 3 mL via INTRAVENOUS

## 2012-06-06 MED ORDER — PANTOPRAZOLE SODIUM 40 MG PO TBEC
40.0000 mg | DELAYED_RELEASE_TABLET | Freq: Every day | ORAL | Status: DC
  Administered 2012-06-07: 40 mg via ORAL
  Filled 2012-06-06: qty 1

## 2012-06-06 MED ORDER — ACETAMINOPHEN 650 MG RE SUPP: 650.0000 mg | Freq: Four times a day (QID) | RECTAL | Status: DC | PRN

## 2012-06-06 MED ORDER — ASPIRIN EC 325 MG PO TBEC
325.0000 mg | DELAYED_RELEASE_TABLET | Freq: Every day | ORAL | Status: DC
  Administered 2012-06-07: 325 mg via ORAL
  Filled 2012-06-06 (×3): qty 1

## 2012-06-06 MED ORDER — ALPRAZOLAM 0.25 MG PO TABS
0.2500 mg | ORAL_TABLET | Freq: Two times a day (BID) | ORAL | Status: DC | PRN
  Administered 2012-06-06 – 2012-06-07 (×2): 0.25 mg via ORAL
  Filled 2012-06-06 (×3): qty 1

## 2012-06-06 MED ORDER — ATORVASTATIN CALCIUM 10 MG PO TABS
10.0000 mg | ORAL_TABLET | Freq: Every day | ORAL | Status: DC
  Administered 2012-06-06 – 2012-06-10 (×4): 10 mg via ORAL
  Filled 2012-06-06 (×6): qty 1

## 2012-06-06 MED ORDER — SODIUM CHLORIDE 0.9 % IJ SOLN
3.0000 mL | Freq: Two times a day (BID) | INTRAMUSCULAR | Status: DC
  Administered 2012-06-07: 3 mL via INTRAVENOUS

## 2012-06-06 MED ORDER — SODIUM CHLORIDE 0.9 % IV SOLN: 250.0000 mL | INTRAVENOUS | Status: DC | PRN

## 2012-06-06 MED ORDER — DOCUSATE SODIUM 100 MG PO CAPS
100.0000 mg | ORAL_CAPSULE | Freq: Every day | ORAL | Status: DC
  Administered 2012-06-07: 100 mg via ORAL
  Filled 2012-06-06 (×2): qty 1

## 2012-06-06 MED ORDER — ONDANSETRON HCL 4 MG PO TABS: 4.0000 mg | ORAL_TABLET | Freq: Four times a day (QID) | ORAL | Status: DC | PRN

## 2012-06-06 MED ORDER — METOPROLOL TARTRATE 12.5 MG HALF TABLET
12.5000 mg | ORAL_TABLET | Freq: Two times a day (BID) | ORAL | Status: DC
  Administered 2012-06-07: 12.5 mg via ORAL
  Filled 2012-06-06 (×4): qty 1

## 2012-06-06 MED ORDER — SODIUM CHLORIDE 0.9 % IJ SOLN: 3.0000 mL | INTRAMUSCULAR | Status: DC | PRN

## 2012-06-06 MED ORDER — ACETAMINOPHEN 325 MG PO TABS
650.0000 mg | ORAL_TABLET | Freq: Four times a day (QID) | ORAL | Status: DC | PRN
  Administered 2012-06-06 – 2012-06-07 (×2): 650 mg via ORAL
  Filled 2012-06-06 (×2): qty 2

## 2012-06-06 MED ORDER — ONDANSETRON HCL 4 MG/2ML IJ SOLN: 4.0000 mg | Freq: Four times a day (QID) | INTRAMUSCULAR | Status: DC | PRN

## 2012-06-06 MED ORDER — HYDROCODONE-ACETAMINOPHEN 5-325 MG PO TABS: 1.0000 | ORAL_TABLET | ORAL | Status: DC | PRN

## 2012-06-06 NOTE — H&P (Signed)
[address]       [place] [address]             [phone]       Primary Care Physician: Dr. Alonna Buckler Cardiologist: Dr. Asa Lente is an 79 y.o. female. [birth year] [id number]   Chief Complaint: Recurrent chest pain with exertion   History of Presenting Illness:  This is an 79 year old Caucasian female with cardiac risk factors that include hypertension and hyperlipidemia who was recently found to have coronary artery disease. She had a cardiac catheterization done by Dr. Darrold Junker on 06/01/2012 at [facility]. He performed a PCI for left main disease. She was discharged home in stable condition. Last evening, however, she was walking in her house and developed chest pain that radiated between her shoulders and she also had shortness of breath. She sat down and took four baby aspirin. She continued to have chest pain so she called 911. She denied any nausea, emesis, or diaphoresis. She has had several episodes of chest pain like this since December, but in those instances, her chest pain stopped when she rested. EMS gave her  aspirin and Nitro paste, which alleviated her chest pain. In addition, she had complaints of right groin pain and bruising at the catheterization site. Troponin I was negative. She was accepted in transfer by Dr. Dorris Fetch for the consideration of coronary artery bypass grafting surgery. Currently, she is chest pain free and has no shortness of breath. She is anxious about the possibility of having surgery.  Past Medical History: Past Medical History  Diagnosis Date  . Allergic rhinitis   . Anemia   . Coronary artery disease   . Hyperlipidemia   . Hypertension   . Osteopenia   . GERD (gastroesophageal reflux disease)         Skin Lupus  Past Surgical History: Past Surgical History  Procedure Laterality Date  . Benign breast cyst    . Carotid testing negative    . Stress echo negative  7/04  . Colonoscopy   9/02    polyp, hematochezia adenoma due in 5 yrs.  . Dexa op  03/02  . Tspine xray scoliosis  11/09/2004    mild thoracic         Family History: Family History  Problem Relation Age of Onset  . Heart disease Mother   . Hypertension Mother   . Cancer Father 89    liver  . Alzheimer's disease Sister   . Hypertension Brother   . Sudden death Brother   . Hypertension Brother 60  . Heart disease Brother   . Hypertension Brother   . Alzheimer's disease Sister      Social History:   has no tobacco, alcohol, and drug history on file.  Allergies:  Allergies  Allergen Reactions  . Alendronate Sodium Upset stomach    REACTION: GI  . Simvastatin     Medications Prior to Admission  Medication Sig Dispense Refill  . ALPRAZolam (XANAX) 0.25 MG tablet Take one half tablet by mouth daily as needed for anxiety.        Marland Kitchen amLODipine (NORVASC) 5 MG tablet Take 1 tablet (5 mg total) by mouth daily.  30 tablet  0  . aspirin 325 MG tablet Take 325 mg by mouth daily.        . meclizine (ANTIVERT) 25 MG tablet  Take 1/2-1 tab by mouth every 6 hours as needed dizziness.       . pravastatin (PRAVACHOL) 80 MG tablet Take 80 mg by mouth at bedtime.           Review of Systems:  Cardiac Review of Systems: Y or N  Chest Pain [ y ] Resting SOB [ n ] Exertional SOB [ y ] Orthopnea [ n]  Pedal Edema [n ] Palpitations [n ] Syncope Milo.Brash ] Presyncope [ n ]  General Review of Systems: [Y] = yes [ N]=no  Constitional:  anorexia [n ]; fatigue [ n]; nausea [n ]; night sweats [ n]; fever [n]; or chills [ n];  Eye : blurred vision [n ]; diplopia Milo.Brash ]; vision changes [n ]; Amaurosis fugax[ n];  Resp: cough [n ]; wheezing[n ]; hemoptysis[n ]; shortness of breath[y ]; paroxysmal nocturnal dyspnea[ n]; dyspnea on exertion[y ]; or orthopnea[n ];  GI: gallstones[y ], vomiting[ n]; dysphagia[n ]; melena[n ]; hematochezia [ ] ; heartburn[ ] ; Hx of Colonoscopy[ ] ;  GU: kidney stones [ n]; hematuria[n ]; dysuria [ n];  nocturia[ n];  Skin: hair loss[n ]; peripheral edema[ n]; or itching[ n];  Musculosketetal: myalgias[n ]; joint swelling[n ]; joint erythema[n ];  joint pain[ n]; back pain[ n];  Heme/Lymph: bruising[ y]; bleeding[n ]; anemia[y ];  Neuro: TIA[ ] n; headaches[ n]; stroke[ n]; vertigo[ ] ; seizures[ n]; paresthesias[ n]; difficulty walking[ n];  Psych:depression[n ]; anxiety[y ];  Endocrine: diabetes[ ] ; thyroid dysfunction[ ] ;    BP 164/80  Pulse 66  Temp(Src) 98.3 F (36.8 C) (Oral)  Resp 18  Ht 5\' 2"  (1.575 m)  Wt 50.304 kg (110 lb 14.4 oz)  BMI 20.28 kg/m2  SpO2 98%  Physical Exam: General appearance: alert, cooperative and no distress Neurologic: intact without focal deficits Heart: regular rate and rhythm and systolic ejection murmur (grade III/VI) Lungs: clear to auscultation bilaterally;no rales, rhonchi, or wheezes Abdomen: soft, non-tender; bowel sounds normal; no masses,  no organomegaly Extremities: no edema, cyanosis of the lower extremities. There is right groin swelling/mass with some ecchymosis. It is not pulsatile. DP and PT are palpable bilaterally.   Diagnostic Studies and Laboratory Results: No results found for this or any previous visit (from the past 48 hour(s)). No results found.   Assessment/Plan:  1. Multivessel CAD (Ostial left main 70%, proximal LAD 90% stenosis, mid LAD with a 95% stenosis, and mid RCA with a 70% stenosis)with LVEF 60%. Consideration for coronary artery bypass grafting surgery 2.Right groin swelling (s/p cardiac cath)-Venous Doppler US done ar [facility] today showed no evidence of RLE DVT, no pseudoaneurysm. Possible hematoma 3. Anemia- H and H on 2/24 was 803 and 26.1 4.Will re check CMP as creatinine 2/24 was 1.74. 5. Check PFT and carotid duplex US  ZIMMERMAN,DONIELLE M, PA-C 06/06/2012, 3:46 PM   79 yo WF in generally good health with a 2 month history of progressive angina. She has left main and 3 vessel CAD at cath and needs  CABG for survival benefit and relief of symptoms. She is relatively high risk due to advanced age. Other issues include right groin hematoma and renal disease, although creatinine 1.3 here. She has a prominent systolic murmur and bilateral carotid bruits v transmitted murmur.  I have discussed with the patient and her family the general nature of the procedure, need for general anesthesia, and incisions to be used. I discussed the expected hospital stay, overall recovery and short and long term outcomes. They understand the risks  include, but are not limited to, death, stroke, MI, DVT/PE, bleeding, possible need for transfusion, infections, cardiac arrhythmias and other organ system dysfunction including respiratory, renal, or GI complications. She understands and accepts these risks and agrees to proceed.  Plan OR Thursday 06/08/12  Needs carotid duplex.

## 2012-06-07 ENCOUNTER — Inpatient Hospital Stay (HOSPITAL_COMMUNITY): Payer: Medicare Other

## 2012-06-07 DIAGNOSIS — Z01810 Encounter for preprocedural cardiovascular examination: Secondary | ICD-10-CM

## 2012-06-07 LAB — BASIC METABOLIC PANEL
CO2: 22 mEq/L (ref 19–32)
Glucose, Bld: 95 mg/dL (ref 70–99)
Potassium: 3.8 mEq/L (ref 3.5–5.1)
Sodium: 138 mEq/L (ref 135–145)

## 2012-06-07 MED ORDER — NITROGLYCERIN IN D5W 200-5 MCG/ML-% IV SOLN
2.0000 ug/min | INTRAVENOUS | Status: AC
  Administered 2012-06-08: 16.6 ug/min via INTRAVENOUS
  Filled 2012-06-07: qty 250

## 2012-06-07 MED ORDER — DEXTROSE 5 % IV SOLN
0.5000 ug/min | INTRAVENOUS | Status: DC
  Filled 2012-06-07: qty 4

## 2012-06-07 MED ORDER — DOPAMINE-DEXTROSE 3.2-5 MG/ML-% IV SOLN
2.0000 ug/kg/min | INTRAVENOUS | Status: AC
  Administered 2012-06-08: 3 ug/kg/min via INTRAVENOUS
  Filled 2012-06-07: qty 250

## 2012-06-07 MED ORDER — POTASSIUM CHLORIDE 2 MEQ/ML IV SOLN
80.0000 meq | INTRAVENOUS | Status: DC
  Filled 2012-06-07: qty 40

## 2012-06-07 MED ORDER — MAGNESIUM SULFATE 50 % IJ SOLN
40.0000 meq | INTRAMUSCULAR | Status: DC
  Filled 2012-06-07: qty 10

## 2012-06-07 MED ORDER — SODIUM CHLORIDE 0.9 % IV SOLN
INTRAVENOUS | Status: AC
  Administered 2012-06-08: 1 [IU]/h via INTRAVENOUS
  Filled 2012-06-07: qty 1

## 2012-06-07 MED ORDER — METOPROLOL TARTRATE 12.5 MG HALF TABLET
12.5000 mg | ORAL_TABLET | Freq: Once | ORAL | Status: AC
  Administered 2012-06-08: 12.5 mg via ORAL
  Filled 2012-06-07: qty 1

## 2012-06-07 MED ORDER — DEXTROSE 5 % IV SOLN
1.5000 g | INTRAVENOUS | Status: AC
  Administered 2012-06-08: 1.5 g via INTRAVENOUS
  Administered 2012-06-08: .75 g via INTRAVENOUS
  Filled 2012-06-07: qty 1.5

## 2012-06-07 MED ORDER — SODIUM CHLORIDE 0.9 % IV SOLN
INTRAVENOUS | Status: AC
  Administered 2012-06-08: 69.8 mL/h via INTRAVENOUS
  Filled 2012-06-07: qty 40

## 2012-06-07 MED ORDER — CHLORHEXIDINE GLUCONATE 4 % EX LIQD
60.0000 mL | Freq: Once | CUTANEOUS | Status: AC
  Administered 2012-06-08: 4 via TOPICAL
  Filled 2012-06-07: qty 60

## 2012-06-07 MED ORDER — DEXTROSE 5 % IV SOLN
750.0000 mg | INTRAVENOUS | Status: DC
  Filled 2012-06-07: qty 750

## 2012-06-07 MED ORDER — BISACODYL 5 MG PO TBEC
5.0000 mg | DELAYED_RELEASE_TABLET | Freq: Once | ORAL | Status: AC
  Administered 2012-06-07: 5 mg via ORAL
  Filled 2012-06-07: qty 1

## 2012-06-07 MED ORDER — VANCOMYCIN HCL 1000 MG IV SOLR
1000.0000 mg | INTRAVENOUS | Status: AC
  Administered 2012-06-08: 1250 mg via INTRAVENOUS
  Filled 2012-06-07 (×3): qty 1000

## 2012-06-07 MED ORDER — DIAZEPAM 2 MG PO TABS
2.0000 mg | ORAL_TABLET | Freq: Once | ORAL | Status: AC
  Administered 2012-06-08: 2 mg via ORAL
  Filled 2012-06-07: qty 1

## 2012-06-07 MED ORDER — METOPROLOL TARTRATE 25 MG PO TABS
25.0000 mg | ORAL_TABLET | Freq: Two times a day (BID) | ORAL | Status: DC
  Administered 2012-06-07 (×2): 25 mg via ORAL
  Filled 2012-06-07 (×4): qty 1

## 2012-06-07 MED ORDER — POTASSIUM CHLORIDE CRYS ER 20 MEQ PO TBCR
20.0000 meq | EXTENDED_RELEASE_TABLET | Freq: Once | ORAL | Status: AC
  Administered 2012-06-07: 20 meq via ORAL
  Filled 2012-06-07: qty 1

## 2012-06-07 MED ORDER — PHENYLEPHRINE HCL 10 MG/ML IJ SOLN
30.0000 ug/min | INTRAVENOUS | Status: AC
  Administered 2012-06-08: 5 ug/min via INTRAVENOUS
  Filled 2012-06-07: qty 2

## 2012-06-07 MED ORDER — VERAPAMIL HCL 2.5 MG/ML IV SOLN
INTRAVENOUS | Status: AC
  Administered 2012-06-08: 07:00:00
  Filled 2012-06-07: qty 2.5

## 2012-06-07 MED ORDER — DEXMEDETOMIDINE HCL IN NACL 400 MCG/100ML IV SOLN
0.1000 ug/kg/h | INTRAVENOUS | Status: AC
  Administered 2012-06-08: 0.2 ug/kg/h via INTRAVENOUS
  Filled 2012-06-07: qty 100

## 2012-06-07 NOTE — Care Management Note (Unsigned)
    Page 1 of 1   06/07/2012     3:19:38 PM   CARE MANAGEMENT NOTE 06/07/2012  Patient:  [name], [name]   Account Number:  [id number]  Date Initiated:  06/07/2012  Documentation initiated by:  Chera Slivka  Subjective/Objective Assessment:   PT ADM WITH 3 VESSEL DISEASE NEEDING CABG, NOW SCHEDULED FOR 06/08/12.  PTA, PT LIVED AT HOME ALONE AND IS INDEPENDENT.     Action/Plan:   MET WITH PT AND FAMILY TO DISCUSS DC PLANS.  FAMILY TO ROTATE TO PROVIDE 24HR CARE FOR PT AT DC.  WILL FOLLOW POST OP FOR DC NEEDS.   Anticipated DC Date:  06/13/2012   Anticipated DC Plan:  HOME W HOME HEALTH SERVICES      DC Planning Services  CM consult      Choice offered to / List presented to:             Status of service:  In process, will continue to follow Medicare Important Message given?   (If response is "NO", the following Medicare IM given date fields will be blank) Date Medicare IM given:   Date Additional Medicare IM given:    Discharge Disposition:    Per UR Regulation:  Reviewed for med. necessity/level of care/duration of stay  If discussed at Long Length of Stay Meetings, dates discussed:    Comments:

## 2012-06-07 NOTE — Progress Notes (Addendum)
                   [address]            [place] [address]          [phone]        Procedure(s) (LRB): CORONARY ARTERY BYPASS GRAFTING (CABG) (N/A)  Subjective: Patient denies chest pain or shortness of breath  Objective: Vital signs in last 24 hours: Temp:  [98.3 F (36.8 C)-98.6 F (37 C)] 98.6 F (37 C) (02/26 0347) Pulse Rate:  [66-104] 97 (02/26 0347) Cardiac Rhythm:  [-] Normal sinus rhythm (02/25 2030) Resp:  [18] 18 (02/26 0347) BP: (126-164)/(63-80) 126/63 mmHg (02/26 0347) SpO2:  [97 %-98 %] 97 % (02/26 0347) Weight:  [50.304 kg (110 lb 14.4 oz)] 50.304 kg (110 lb 14.4 oz) (02/25 1438)   Current Weight  06/06/12 50.304 kg (110 lb 14.4 oz)      Intake/Output from previous day: 02/25 0701 - 02/26 0700 In: 240 [P.O.:240] Out: -    Physical Exam:  Cardiovascular: RRR, systolic murmur Pulmonary: Clear to auscultation bilaterally; no rales, wheezes, or rhonchi. Abdomen: Soft, non tender, bowel sounds present. Extremities: No lower extremity edema. Swelling right groin is tender and non pulsatile, minor ecchymosis   Lab Results: CBC: Recent Labs  06/06/12 1620  WBC 8.1  HGB 9.2*  HCT 27.4*  PLT 217   BMET:  Recent Labs  06/06/12 1620 06/07/12 0435  NA 137 138  K 3.7 3.8  CL 105 106  CO2 20 22  GLUCOSE 105* 95  BUN 19 19  CREATININE 1.31* 1.31*  CALCIUM 9.1 8.7    PT/INR:  Lab Results  Component Value Date   INR 1.05 06/06/2012   ABG:  INR: Will add last result for INR, ABG once components are confirmed Will add last 4 CBG results once components are confirmed  Assessment/Plan: 1.CV-SR/ST. On Lopressor 12.5 bid. She was on 50 bid prior to admission. Will increase to 25 bid. 2. Creatinine down to 1.3 3.Supplement potassium 4.Await carotid duplex results 5.To OR in am for CABG  ZIMMERMAN,DONIELLE MPA-C 06/07/2012,8:22 AM  Patient seen and examined. Agree with above.  Carotids OK  For CABG in AM. All  questions answered

## 2012-06-07 NOTE — Progress Notes (Signed)
Utilization Review Completed.Ellawyn Wogan T2/26/2014  

## 2012-06-07 NOTE — Progress Notes (Signed)
VASCULAR LAB PRELIMINARY  PRELIMINARY  PRELIMINARY  PRELIMINARY  Pre-op Cardiac Surgery  Carotid Findings:  Right:  Moderate irregular heterogeneous plaque.  No hemodynamically significant stenosis by velocity and ICA/CCA ratio.  Left:  No hemodynamically significant stenosis.  Bilateral:  Vertebral artery flow is antegrade.  Upper Extremity Right Left  Brachial Pressures 159 Triphasic  159 Triphasic   Radial Waveforms Triphasic  Triphasic   Ulnar Waveforms Triphasic  Triphasic   Palmar Arch (Allen's Test) Within normal limits  Doppler normal with radial compression; decreases 50% with ulnar compression.   Lower Extremity  Findings:  Bilateral palpable pulses.    Nakiya Rallis, RVT 06/07/2012, 10:25 AM

## 2012-06-08 ENCOUNTER — Encounter (HOSPITAL_COMMUNITY)
Admission: AD | Disposition: A | Discharge status: Home / Self Care | Payer: Self-pay | Source: Other Acute Inpatient Hospital | Attending: Thoracic Surgery (Cardiothoracic Vascular Surgery)

## 2012-06-08 ENCOUNTER — Inpatient Hospital Stay (HOSPITAL_COMMUNITY): Payer: Medicare Other

## 2012-06-08 ENCOUNTER — Inpatient Hospital Stay (HOSPITAL_COMMUNITY): Payer: Medicare Other | Admitting: Certified Registered"

## 2012-06-08 ENCOUNTER — Encounter (HOSPITAL_COMMUNITY): Payer: Self-pay | Admitting: Anesthesiology

## 2012-06-08 ENCOUNTER — Encounter (HOSPITAL_COMMUNITY): Payer: Self-pay | Admitting: Certified Registered"

## 2012-06-08 DIAGNOSIS — I2510 Atherosclerotic heart disease of native coronary artery without angina pectoris: Secondary | ICD-10-CM

## 2012-06-08 HISTORY — PX: TEE WITHOUT CARDIOVERSION: SHX5443

## 2012-06-08 HISTORY — PX: CORONARY ARTERY BYPASS GRAFT: SHX141

## 2012-06-08 LAB — BASIC METABOLIC PANEL
BUN: 18 mg/dL (ref 6–23)
Calcium: 8.8 mg/dL (ref 8.4–10.5)
GFR calc Af Amer: 41 mL/min — ABNORMAL LOW (ref >90)
GFR calc non Af Amer: 35 mL/min — ABNORMAL LOW (ref >90)
Potassium: 4.2 mEq/L (ref 3.5–5.1)
Sodium: 137 mEq/L (ref 135–145)

## 2012-06-08 LAB — POCT I-STAT 4, (NA,K, GLUC, HGB,HCT)
Glucose, Bld: 124 mg/dL — ABNORMAL HIGH (ref 70–99)
Glucose, Bld: 132 mg/dL — ABNORMAL HIGH (ref 70–99)
Glucose, Bld: 126 mg/dL — ABNORMAL HIGH (ref 70–99)
HCT: 20 % — ABNORMAL LOW (ref 36.0–46.0)
HCT: 21 % — ABNORMAL LOW (ref 36.0–46.0)
HCT: 29 % — ABNORMAL LOW (ref 36.0–46.0)
Hemoglobin: 8.5 g/dL — ABNORMAL LOW (ref 12.0–15.0)
Hemoglobin: 7.1 g/dL — ABNORMAL LOW (ref 12.0–15.0)
Hemoglobin: 9.9 g/dL — ABNORMAL LOW (ref 12.0–15.0)
Hemoglobin: 13.3 g/dL (ref 12.0–15.0)
Potassium: 4 mEq/L (ref 3.5–5.1)
Potassium: 4.2 mEq/L (ref 3.5–5.1)
Potassium: 5.2 mEq/L — ABNORMAL HIGH (ref 3.5–5.1)
Potassium: 5.7 mEq/L — ABNORMAL HIGH (ref 3.5–5.1)
Potassium: 4.8 mEq/L (ref 3.5–5.1)
Sodium: 137 mEq/L (ref 135–145)
Sodium: 131 mEq/L — ABNORMAL LOW (ref 135–145)

## 2012-06-08 LAB — CBC
MCH: 29 pg (ref 26.0–34.0)
MCHC: 33.3 g/dL (ref 30.0–36.0)
MCHC: 34.6 g/dL (ref 30.0–36.0)
Platelets: 93 10*3/uL — ABNORMAL LOW (ref 150–400)
Platelets: 81 10*3/uL — ABNORMAL LOW (ref 150–400)
RDW: 13.5 % (ref 11.5–15.5)
RDW: 16.7 % — ABNORMAL HIGH (ref 11.5–15.5)
WBC: 14 10*3/uL — ABNORMAL HIGH (ref 4.0–10.5)

## 2012-06-08 LAB — GLUCOSE, CAPILLARY
Glucose-Capillary: 100 mg/dL — ABNORMAL HIGH (ref 70–99)
Glucose-Capillary: 137 mg/dL — ABNORMAL HIGH (ref 70–99)
Glucose-Capillary: 140 mg/dL — ABNORMAL HIGH (ref 70–99)
Glucose-Capillary: 170 mg/dL — ABNORMAL HIGH (ref 70–99)
Glucose-Capillary: 216 mg/dL — ABNORMAL HIGH (ref 70–99)

## 2012-06-08 LAB — PLATELET COUNT: Platelets: 110 10*3/uL — ABNORMAL LOW (ref 150–400)

## 2012-06-08 LAB — POCT I-STAT 3, ART BLOOD GAS (G3+)
Acid-base deficit: 6 mmol/L — ABNORMAL HIGH (ref 0.0–2.0)
O2 Saturation: 100 %
Patient temperature: 36.5
pH, Arterial: 7.375 (ref 7.350–7.450)
pO2, Arterial: 349 mmHg — ABNORMAL HIGH (ref 80.0–100.0)

## 2012-06-08 LAB — POCT I-STAT, CHEM 8
BUN: 14 mg/dL (ref 6–23)
HCT: 34 % — ABNORMAL LOW (ref 36.0–46.0)
Hemoglobin: 11.6 g/dL — ABNORMAL LOW (ref 12.0–15.0)
Sodium: 137 mEq/L (ref 135–145)
TCO2: 19 mmol/L (ref 0–100)

## 2012-06-08 LAB — MAGNESIUM: Magnesium: 4 mg/dL — ABNORMAL HIGH (ref 1.5–2.5)

## 2012-06-08 LAB — HEMOGLOBIN AND HEMATOCRIT, BLOOD: Hemoglobin: 9.8 g/dL — ABNORMAL LOW (ref 12.0–15.0)

## 2012-06-08 LAB — CREATININE, SERUM
Creatinine, Ser: 1.13 mg/dL — ABNORMAL HIGH (ref 0.50–1.10)
GFR calc non Af Amer: 43 mL/min — ABNORMAL LOW (ref >90)

## 2012-06-08 LAB — PROTIME-INR: Prothrombin Time: 17 seconds — ABNORMAL HIGH (ref 11.6–15.2)

## 2012-06-08 SURGERY — CORONARY ARTERY BYPASS GRAFTING (CABG)
Anesthesia: General | Site: Chest | Wound class: Clean

## 2012-06-08 MED ORDER — ALBUMIN HUMAN 5 % IV SOLN
250.0000 mL | INTRAVENOUS | Status: AC | PRN
  Administered 2012-06-08 (×3): 250 mL via INTRAVENOUS

## 2012-06-08 MED ORDER — MAGNESIUM SULFATE 40 MG/ML IJ SOLN
INTRAMUSCULAR | Status: AC
  Administered 2012-06-08: 4 g via INTRAVENOUS
  Filled 2012-06-08: qty 100

## 2012-06-08 MED ORDER — ASPIRIN EC 325 MG PO TBEC
325.0000 mg | DELAYED_RELEASE_TABLET | Freq: Every day | ORAL | Status: DC
  Administered 2012-06-09 – 2012-06-14 (×5): 325 mg via ORAL
  Filled 2012-06-08 (×6): qty 1

## 2012-06-08 MED ORDER — DEXTROSE 5 % IV SOLN
1.5000 g | Freq: Two times a day (BID) | INTRAVENOUS | Status: AC
  Administered 2012-06-08 – 2012-06-10 (×4): 1.5 g via INTRAVENOUS
  Filled 2012-06-08 (×4): qty 1.5

## 2012-06-08 MED ORDER — DOPAMINE-DEXTROSE 3.2-5 MG/ML-% IV SOLN
0.0000 ug/kg/min | INTRAVENOUS | Status: DC
  Administered 2012-06-08: 3 ug/kg/min via INTRAVENOUS

## 2012-06-08 MED ORDER — ACETAMINOPHEN 10 MG/ML IV SOLN
1000.0000 mg | Freq: Once | INTRAVENOUS | Status: AC
  Administered 2012-06-08: 1000 mg via INTRAVENOUS
  Filled 2012-06-08: qty 100

## 2012-06-08 MED ORDER — METOCLOPRAMIDE HCL 5 MG/ML IJ SOLN
10.0000 mg | Freq: Four times a day (QID) | INTRAMUSCULAR | Status: DC
  Administered 2012-06-08 (×2): 10 mg via INTRAVENOUS
  Filled 2012-06-08 (×4): qty 2

## 2012-06-08 MED ORDER — PANTOPRAZOLE SODIUM 40 MG PO TBEC
40.0000 mg | DELAYED_RELEASE_TABLET | Freq: Every day | ORAL | Status: DC
  Administered 2012-06-10 – 2012-06-14 (×5): 40 mg via ORAL
  Filled 2012-06-08 (×5): qty 1

## 2012-06-08 MED ORDER — ACETAMINOPHEN 160 MG/5ML PO SOLN
975.0000 mg | Freq: Four times a day (QID) | ORAL | Status: DC
  Administered 2012-06-09 (×2): 975 mg
  Filled 2012-06-08: qty 40.6

## 2012-06-08 MED ORDER — MAGNESIUM SULFATE 40 MG/ML IJ SOLN: 4.0000 g | Freq: Once | INTRAMUSCULAR | Status: AC

## 2012-06-08 MED ORDER — SODIUM CHLORIDE 0.9 % IJ SOLN
OROMUCOSAL | Status: DC | PRN
  Administered 2012-06-08: 08:00:00 via TOPICAL

## 2012-06-08 MED ORDER — SODIUM CHLORIDE 0.9 % IV SOLN: 250.0000 mL | INTRAVENOUS | Status: DC | PRN

## 2012-06-08 MED ORDER — SODIUM CHLORIDE 0.9 % IV SOLN
INTRAVENOUS | Status: DC
  Administered 2012-06-08: 2 [IU]/h via INTRAVENOUS
  Administered 2012-06-09: via INTRAVENOUS
  Filled 2012-06-08 (×2): qty 1

## 2012-06-08 MED ORDER — SODIUM CHLORIDE 0.9 % IJ SOLN: 3.0000 mL | INTRAMUSCULAR | Status: DC | PRN

## 2012-06-08 MED ORDER — METOPROLOL TARTRATE 25 MG/10 ML ORAL SUSPENSION
12.5000 mg | Freq: Two times a day (BID) | ORAL | Status: DC
  Filled 2012-06-08 (×3): qty 5

## 2012-06-08 MED ORDER — MIDAZOLAM HCL 2 MG/2ML IJ SOLN
INTRAMUSCULAR | Status: AC
  Filled 2012-06-08: qty 2

## 2012-06-08 MED ORDER — FAMOTIDINE IN NACL 20-0.9 MG/50ML-% IV SOLN
20.0000 mg | Freq: Two times a day (BID) | INTRAVENOUS | Status: AC
  Administered 2012-06-08 – 2012-06-09 (×2): 20 mg via INTRAVENOUS
  Filled 2012-06-08: qty 50

## 2012-06-08 MED ORDER — MORPHINE SULFATE 2 MG/ML IJ SOLN
1.0000 mg | INTRAMUSCULAR | Status: AC | PRN
  Administered 2012-06-08: 1 mg via INTRAVENOUS
  Filled 2012-06-08: qty 1

## 2012-06-08 MED ORDER — MUPIROCIN 2 % EX OINT
1.0000 "application " | TOPICAL_OINTMENT | Freq: Two times a day (BID) | CUTANEOUS | Status: AC
  Administered 2012-06-08 – 2012-06-12 (×10): 1 via NASAL
  Filled 2012-06-08: qty 22

## 2012-06-08 MED ORDER — DEXMEDETOMIDINE HCL IN NACL 200 MCG/50ML IV SOLN
0.1000 ug/kg/h | INTRAVENOUS | Status: DC
Start: 2012-06-08 — End: 2012-06-09
  Administered 2012-06-08: 0.5 ug/kg/h via INTRAVENOUS

## 2012-06-08 MED ORDER — METOPROLOL TARTRATE 1 MG/ML IV SOLN
2.5000 mg | INTRAVENOUS | Status: DC | PRN
  Administered 2012-06-09 – 2012-06-11 (×4): 5 mg via INTRAVENOUS
  Administered 2012-06-12: 2.5 mg via INTRAVENOUS
  Administered 2012-06-13 – 2012-06-14 (×2): 5 mg via INTRAVENOUS
  Filled 2012-06-08 (×6): qty 5

## 2012-06-08 MED ORDER — 0.9 % SODIUM CHLORIDE (POUR BTL) OPTIME
TOPICAL | Status: DC | PRN
  Administered 2012-06-08: 1000 mL

## 2012-06-08 MED ORDER — AMINOCAPROIC ACID 250 MG/ML IV SOLN: INTRAVENOUS | Status: DC | PRN

## 2012-06-08 MED ORDER — NITROGLYCERIN IN D5W 200-5 MCG/ML-% IV SOLN
0.0000 ug/min | INTRAVENOUS | Status: DC
  Administered 2012-06-08 – 2012-06-09 (×2): 20 ug/min via INTRAVENOUS
  Filled 2012-06-08: qty 250

## 2012-06-08 MED ORDER — OXYCODONE HCL 5 MG PO TABS: 5.0000 mg | ORAL_TABLET | ORAL | Status: DC | PRN

## 2012-06-08 MED ORDER — DOCUSATE SODIUM 100 MG PO CAPS
200.0000 mg | ORAL_CAPSULE | Freq: Every day | ORAL | Status: DC
  Administered 2012-06-09 – 2012-06-13 (×3): 200 mg via ORAL
  Filled 2012-06-08 (×4): qty 2

## 2012-06-08 MED ORDER — LACTATED RINGERS IV SOLN: 500.0000 mL | Freq: Once | INTRAVENOUS | Status: AC | PRN

## 2012-06-08 MED ORDER — ARTIFICIAL TEARS OP OINT
TOPICAL_OINTMENT | OPHTHALMIC | Status: DC | PRN
  Administered 2012-06-08: 1 via OPHTHALMIC

## 2012-06-08 MED ORDER — FENTANYL CITRATE 0.05 MG/ML IJ SOLN
INTRAMUSCULAR | Status: DC | PRN
  Administered 2012-06-08 (×2): 50 ug via INTRAVENOUS
  Administered 2012-06-08: 100 ug via INTRAVENOUS
  Administered 2012-06-08: 500 ug via INTRAVENOUS
  Administered 2012-06-08 (×2): 100 ug via INTRAVENOUS
  Administered 2012-06-08: 50 ug via INTRAVENOUS
  Administered 2012-06-08: 300 ug via INTRAVENOUS
  Administered 2012-06-08: 100 ug via INTRAVENOUS
  Administered 2012-06-08: 250 ug via INTRAVENOUS

## 2012-06-08 MED ORDER — LACTATED RINGERS IV SOLN
INTRAVENOUS | Status: DC
  Administered 2012-06-08: 14:00:00 via INTRAVENOUS
  Administered 2012-06-09: 20 mL/h via INTRAVENOUS

## 2012-06-08 MED ORDER — PROTAMINE SULFATE 10 MG/ML IV SOLN
INTRAVENOUS | Status: DC | PRN
  Administered 2012-06-08: 50 mg via INTRAVENOUS
  Administered 2012-06-08: 30 mg via INTRAVENOUS
  Administered 2012-06-08: 10 mg via INTRAVENOUS
  Administered 2012-06-08: 60 mg via INTRAVENOUS

## 2012-06-08 MED ORDER — BISACODYL 10 MG RE SUPP: 10.0000 mg | Freq: Every day | RECTAL | Status: DC

## 2012-06-08 MED ORDER — VECURONIUM BROMIDE 10 MG IV SOLR
INTRAVENOUS | Status: DC | PRN
  Administered 2012-06-08: 3 mg via INTRAVENOUS

## 2012-06-08 MED ORDER — CHLORHEXIDINE GLUCONATE CLOTH 2 % EX PADS
6.0000 | MEDICATED_PAD | Freq: Every day | CUTANEOUS | Status: AC
  Administered 2012-06-08 – 2012-06-12 (×5): 6 via TOPICAL

## 2012-06-08 MED ORDER — LACTATED RINGERS IV SOLN: INTRAVENOUS | Status: DC | PRN

## 2012-06-08 MED ORDER — ROCURONIUM BROMIDE 100 MG/10ML IV SOLN
INTRAVENOUS | Status: DC | PRN
  Administered 2012-06-08 (×2): 50 mg via INTRAVENOUS

## 2012-06-08 MED ORDER — VANCOMYCIN HCL IN DEXTROSE 1-5 GM/200ML-% IV SOLN
1000.0000 mg | Freq: Once | INTRAVENOUS | Status: AC
  Administered 2012-06-08: 1000 mg via INTRAVENOUS
  Filled 2012-06-08: qty 200

## 2012-06-08 MED ORDER — SODIUM CHLORIDE 0.9 % IJ SOLN
3.0000 mL | Freq: Two times a day (BID) | INTRAMUSCULAR | Status: DC
  Administered 2012-06-09 – 2012-06-12 (×5): 3 mL via INTRAVENOUS

## 2012-06-08 MED ORDER — BISACODYL 5 MG PO TBEC
10.0000 mg | DELAYED_RELEASE_TABLET | Freq: Every day | ORAL | Status: DC
  Administered 2012-06-09 – 2012-06-10 (×2): 10 mg via ORAL
  Filled 2012-06-08 (×2): qty 2

## 2012-06-08 MED ORDER — ACETAMINOPHEN 500 MG PO TABS
1000.0000 mg | ORAL_TABLET | Freq: Four times a day (QID) | ORAL | Status: AC
  Administered 2012-06-09 – 2012-06-13 (×13): 1000 mg via ORAL
  Filled 2012-06-08 (×20): qty 2

## 2012-06-08 MED ORDER — SODIUM CHLORIDE 0.9 % IV SOLN
INTRAVENOUS | Status: DC
  Administered 2012-06-08: 14:00:00 via INTRAVENOUS

## 2012-06-08 MED ORDER — MIDAZOLAM HCL 2 MG/2ML IJ SOLN: 2.0000 mg | INTRAMUSCULAR | Status: DC | PRN

## 2012-06-08 MED ORDER — METOCLOPRAMIDE HCL 5 MG/ML IJ SOLN
10.0000 mg | Freq: Four times a day (QID) | INTRAMUSCULAR | Status: AC
  Administered 2012-06-09 (×2): 10 mg via INTRAVENOUS
  Filled 2012-06-08 (×2): qty 2

## 2012-06-08 MED ORDER — HEPARIN SODIUM (PORCINE) 1000 UNIT/ML IJ SOLN
INTRAMUSCULAR | Status: DC | PRN
  Administered 2012-06-08: 14000 [IU] via INTRAVENOUS
  Administered 2012-06-08: 2000 [IU] via INTRAVENOUS

## 2012-06-08 MED ORDER — MORPHINE SULFATE 2 MG/ML IJ SOLN
2.0000 mg | INTRAMUSCULAR | Status: DC | PRN
  Administered 2012-06-09: 1 mg via INTRAVENOUS
  Filled 2012-06-08: qty 1

## 2012-06-08 MED ORDER — HEMOSTATIC AGENTS (NO CHARGE) OPTIME
TOPICAL | Status: DC | PRN
  Administered 2012-06-08 (×3): 1 via TOPICAL

## 2012-06-08 MED ORDER — ONDANSETRON HCL 4 MG/2ML IJ SOLN
4.0000 mg | Freq: Four times a day (QID) | INTRAMUSCULAR | Status: DC | PRN
  Administered 2012-06-09: 4 mg via INTRAVENOUS
  Filled 2012-06-08: qty 2

## 2012-06-08 MED ORDER — LACTATED RINGERS IV SOLN
INTRAVENOUS | Status: DC | PRN
  Administered 2012-06-08 (×3): via INTRAVENOUS

## 2012-06-08 MED ORDER — PROPOFOL 10 MG/ML IV BOLUS
INTRAVENOUS | Status: DC | PRN
  Administered 2012-06-08: 70 mg via INTRAVENOUS

## 2012-06-08 MED ORDER — ASPIRIN 81 MG PO CHEW
324.0000 mg | CHEWABLE_TABLET | Freq: Every day | ORAL | Status: DC
  Administered 2012-06-10: 324 mg
  Filled 2012-06-08: qty 4

## 2012-06-08 MED ORDER — MIDAZOLAM HCL 5 MG/5ML IJ SOLN
INTRAMUSCULAR | Status: DC | PRN
  Administered 2012-06-08: 2 mg via INTRAVENOUS
  Administered 2012-06-08: 4 mg via INTRAVENOUS
  Administered 2012-06-08: 2 mg via INTRAVENOUS

## 2012-06-08 MED ORDER — SODIUM CHLORIDE 0.45 % IV SOLN
INTRAVENOUS | Status: DC
  Administered 2012-06-08: 14:00:00 via INTRAVENOUS

## 2012-06-08 MED ORDER — PHENYLEPHRINE HCL 10 MG/ML IJ SOLN
0.0000 ug/min | INTRAVENOUS | Status: DC
  Administered 2012-06-08: 15 ug/min via INTRAVENOUS
  Filled 2012-06-08: qty 2

## 2012-06-08 MED ORDER — FENTANYL CITRATE 0.05 MG/ML IJ SOLN
INTRAMUSCULAR | Status: AC
  Filled 2012-06-08: qty 2

## 2012-06-08 MED ORDER — INSULIN REGULAR BOLUS VIA INFUSION
0.0000 [IU] | Freq: Three times a day (TID) | INTRAVENOUS | Status: DC
  Filled 2012-06-08: qty 10

## 2012-06-08 MED ORDER — METOPROLOL TARTRATE 12.5 MG HALF TABLET
12.5000 mg | ORAL_TABLET | Freq: Two times a day (BID) | ORAL | Status: DC
  Filled 2012-06-08 (×3): qty 1

## 2012-06-08 MED ORDER — POTASSIUM CHLORIDE 10 MEQ/50ML IV SOLN: 10.0000 meq | INTRAVENOUS | Status: AC

## 2012-06-08 SURGICAL SUPPLY — 93 items
ATTRACTOMAT 16X20 MAGNETIC DRP (DRAPES) ×3 IMPLANT
BAG DECANTER FOR FLEXI CONT (MISCELLANEOUS) ×3 IMPLANT
BANDAGE ELASTIC 4 VELCRO ST LF (GAUZE/BANDAGES/DRESSINGS) ×3 IMPLANT
BANDAGE ELASTIC 6 VELCRO ST LF (GAUZE/BANDAGES/DRESSINGS) ×3 IMPLANT
BANDAGE GAUZE ELAST BULKY 4 IN (GAUZE/BANDAGES/DRESSINGS) ×3 IMPLANT
BASKET HEART (ORDER IN 25'S) (MISCELLANEOUS) ×1
BASKET HEART (ORDER IN 25S) (MISCELLANEOUS) ×2 IMPLANT
BLADE STERNUM SYSTEM 6 (BLADE) ×3 IMPLANT
BLADE SURG 11 STRL SS (BLADE) ×3 IMPLANT
BLADE SURG ROTATE 9660 (MISCELLANEOUS) ×3 IMPLANT
CANISTER SUCTION 2500CC (MISCELLANEOUS) ×3 IMPLANT
CATH CPB KIT HENDRICKSON (MISCELLANEOUS) ×3 IMPLANT
CATH ROBINSON RED A/P 18FR (CATHETERS) ×3 IMPLANT
CATH THORACIC 36FR (CATHETERS) ×3 IMPLANT
CATH THORACIC 36FR RT ANG (CATHETERS) ×3 IMPLANT
CLIP FOGARTY SPRING 6M (CLIP) ×3 IMPLANT
CLIP TI MEDIUM 24 (CLIP) IMPLANT
CLIP TI WIDE RED SMALL 24 (CLIP) ×3 IMPLANT
CLOTH BEACON ORANGE TIMEOUT ST (SAFETY) ×3 IMPLANT
COVER SURGICAL LIGHT HANDLE (MISCELLANEOUS) ×3 IMPLANT
CRADLE DONUT ADULT HEAD (MISCELLANEOUS) ×3 IMPLANT
DERMABOND ADVANCED (GAUZE/BANDAGES/DRESSINGS) ×1
DERMABOND ADVANCED .7 DNX12 (GAUZE/BANDAGES/DRESSINGS) ×2 IMPLANT
DRAPE CARDIOVASCULAR INCISE (DRAPES) ×1
DRAPE SLUSH/WARMER DISC (DRAPES) ×3 IMPLANT
DRAPE SRG 135X102X78XABS (DRAPES) ×2 IMPLANT
DRSG COVADERM 4X14 (GAUZE/BANDAGES/DRESSINGS) ×3 IMPLANT
ELECT REM PT RETURN 9FT ADLT (ELECTROSURGICAL) ×6
ELECTRODE REM PT RTRN 9FT ADLT (ELECTROSURGICAL) ×4 IMPLANT
GLOVE BIO SURGEON STRL SZ 6 (GLOVE) ×12 IMPLANT
GLOVE BIO SURGEON STRL SZ 6.5 (GLOVE) ×9 IMPLANT
GLOVE BIOGEL PI IND STRL 6 (GLOVE) ×2 IMPLANT
GLOVE BIOGEL PI IND STRL 6.5 (GLOVE) ×8 IMPLANT
GLOVE BIOGEL PI IND STRL 7.0 (GLOVE) ×4 IMPLANT
GLOVE BIOGEL PI INDICATOR 6 (GLOVE) ×1
GLOVE BIOGEL PI INDICATOR 6.5 (GLOVE) ×4
GLOVE BIOGEL PI INDICATOR 7.0 (GLOVE) ×2
GLOVE EUDERMIC 7 POWDERFREE (GLOVE) ×9 IMPLANT
GOWN PREVENTION PLUS XLARGE (GOWN DISPOSABLE) ×6 IMPLANT
GOWN STRL NON-REIN LRG LVL3 (GOWN DISPOSABLE) ×21 IMPLANT
HEMOSTAT POWDER SURGIFOAM 1G (HEMOSTASIS) ×9 IMPLANT
HEMOSTAT SURGICEL 2X14 (HEMOSTASIS) ×6 IMPLANT
INSERT FOGARTY XLG (MISCELLANEOUS) IMPLANT
KIT BASIN OR (CUSTOM PROCEDURE TRAY) ×3 IMPLANT
KIT ROOM TURNOVER OR (KITS) ×3 IMPLANT
KIT SUCTION CATH 14FR (SUCTIONS) ×6 IMPLANT
KIT VASOVIEW W/TROCAR VH 2000 (KITS) ×3 IMPLANT
MARKER GRAFT CORONARY BYPASS (MISCELLANEOUS) ×9 IMPLANT
NS IRRIG 1000ML POUR BTL (IV SOLUTION) ×18 IMPLANT
PACK OPEN HEART (CUSTOM PROCEDURE TRAY) ×3 IMPLANT
PAD ARMBOARD 7.5X6 YLW CONV (MISCELLANEOUS) ×6 IMPLANT
PENCIL BUTTON HOLSTER BLD 10FT (ELECTRODE) ×3 IMPLANT
PUNCH AORTIC ROTATE 4.0MM (MISCELLANEOUS) ×3 IMPLANT
PUNCH AORTIC ROTATE 4.5MM 8IN (MISCELLANEOUS) IMPLANT
PUNCH AORTIC ROTATE 5MM 8IN (MISCELLANEOUS) IMPLANT
SET CARDIOPLEGIA MPS 5001102 (MISCELLANEOUS) ×3 IMPLANT
SOLUTION ANTI FOG 6CC (MISCELLANEOUS) ×3 IMPLANT
SPONGE GAUZE 4X4 12PLY (GAUZE/BANDAGES/DRESSINGS) ×3 IMPLANT
SURGIFLO W/THROMBIN 8M KIT (HEMOSTASIS) ×3 IMPLANT
SUT BONE WAX W31G (SUTURE) ×3 IMPLANT
SUT MNCRL AB 4-0 PS2 18 (SUTURE) ×3 IMPLANT
SUT PROLENE 3 0 SH DA (SUTURE) ×3 IMPLANT
SUT PROLENE 4 0 RB 1 (SUTURE) ×4
SUT PROLENE 4 0 SH DA (SUTURE) ×12 IMPLANT
SUT PROLENE 4-0 RB1 .5 CRCL 36 (SUTURE) ×8 IMPLANT
SUT PROLENE 6 0 C 1 30 (SUTURE) ×9 IMPLANT
SUT PROLENE 7 0 BV 1 (SUTURE) ×6 IMPLANT
SUT PROLENE 7 0 BV1 MDA (SUTURE) ×9 IMPLANT
SUT PROLENE 8 0 BV175 6 (SUTURE) IMPLANT
SUT SILK  1 MH (SUTURE)
SUT SILK 1 MH (SUTURE) IMPLANT
SUT STEEL 6MS V (SUTURE) ×3 IMPLANT
SUT STEEL STERNAL CCS#1 18IN (SUTURE) IMPLANT
SUT STEEL SZ 6 DBL 3X14 BALL (SUTURE) ×3 IMPLANT
SUT VIC AB 1 CTX 36 (SUTURE) ×2
SUT VIC AB 1 CTX36XBRD ANBCTR (SUTURE) ×4 IMPLANT
SUT VIC AB 2-0 CT1 27 (SUTURE) ×1
SUT VIC AB 2-0 CT1 TAPERPNT 27 (SUTURE) ×2 IMPLANT
SUT VIC AB 2-0 CTX 27 (SUTURE) IMPLANT
SUT VIC AB 3-0 SH 27 (SUTURE)
SUT VIC AB 3-0 SH 27X BRD (SUTURE) IMPLANT
SUT VIC AB 3-0 X1 27 (SUTURE) IMPLANT
SUT VICRYL 4-0 PS2 18IN ABS (SUTURE) IMPLANT
SUTURE E-PAK OPEN HEART (SUTURE) ×3 IMPLANT
SYSTEM SAHARA CHEST DRAIN ATS (WOUND CARE) ×3 IMPLANT
TAPE CLOTH SURG 4X10 WHT LF (GAUZE/BANDAGES/DRESSINGS) ×3 IMPLANT
TOWEL OR 17X24 6PK STRL BLUE (TOWEL DISPOSABLE) ×6 IMPLANT
TOWEL OR 17X26 10 PK STRL BLUE (TOWEL DISPOSABLE) ×6 IMPLANT
TRAY FOLEY IC TEMP SENS 14FR (CATHETERS) ×3 IMPLANT
TUBE FEEDING 8FR 16IN STR KANG (MISCELLANEOUS) ×3 IMPLANT
TUBING INSUFFLATION 10FT LAP (TUBING) ×3 IMPLANT
UNDERPAD 30X30 INCONTINENT (UNDERPADS AND DIAPERS) ×3 IMPLANT
WATER STERILE IRR 1000ML POUR (IV SOLUTION) ×6 IMPLANT

## 2012-06-08 NOTE — Brief Op Note (Addendum)
06/06/2012 - 06/08/2012  11:24 AM  PATIENT:  [name]  79 y.o. female  PRE-OPERATIVE DIAGNOSIS:  cad, 3 vessel disease  POST-OPERATIVE DIAGNOSIS:  cad, 3 vessel disease  PROCEDURE:  Procedure(s):  CORONARY ARTERY BYPASS GRAFTING x3 -LIMA to LAD -SVG to OM1 -SVG to PDA  ENDOSCOPIC SAPHENOUS VEIN HARVEST RIGHT LEG   TRANSESOPHAGEAL ECHOCARDIOGRAM (TEE) (N/A)  SURGEON:  Surgeon(s) and Role:    * Loreli Slot, MD - Primary  PHYSICIAN ASSISTANT: Erin Barrett PA-C  ANESTHESIA:   general  EBL:  Total I/O In: 1300 [I.V.:1300] Out: 250 [Urine:250]  BLOOD ADMINISTERED:1 unit CC PRBC, CELLSAVER  DRAINS: Left Pleural Chest tube, Mediastinal chest drains   LOCAL MEDICATIONS USED:  NONE  SPECIMEN:  No Specimen  DISPOSITION OF SPECIMEN:  N/A  COUNTS:  YES  PLAN OF CARE: Admit to inpatient   PATIENT DISPOSITION:  ICU - intubated and hemodynamically stable.   Delay start of Pharmacological VTE agent (>24hrs) due to surgical blood loss or risk of bleeding: yes  XC= 65 min CPB= 94 min  TEE- aortic sclerosis without significant gradient, mild MR

## 2012-06-08 NOTE — Interval H&P Note (Signed)
History and Physical Interval Note:  06/08/2012 8:02 AM  [name]  has presented today for surgery, with the diagnosis of cad, 3 vessel disease  The various methods of treatment have been discussed with the patient and family. After consideration of risks, benefits and other options for treatment, the patient has consented to  Procedure(s): CORONARY ARTERY BYPASS GRAFTING (CABG) (N/A) as a surgical intervention .  The patient's history has been reviewed, patient examined, no change in status, stable for surgery.  I have reviewed the patient's chart and labs.  Questions were answered to the patient's satisfaction.    Carotids and PFTs OK  [name]

## 2012-06-08 NOTE — Progress Notes (Signed)
  Echocardiogram Echocardiogram Transesophageal has been performed.  Ellender Hose A 06/08/2012, 8:46 AM

## 2012-06-08 NOTE — Anesthesia Postprocedure Evaluation (Signed)
  Anesthesia Post-op Note  Patient: [name]  Procedure(s) Performed: Procedure(s): CORONARY ARTERY BYPASS GRAFTING (CABG) (N/A) TRANSESOPHAGEAL ECHOCARDIOGRAM (TEE) (N/A)  Patient Location: SICU  Anesthesia Type:General  Level of Consciousness: sedated  Airway and Oxygen Therapy: Patient remains intubated per anesthesia plan and Patient placed on Ventilator (see vital sign flow sheet for setting)  Post-op Pain: none  Post-op Assessment: Post-op Vital signs reviewed, Patient's Cardiovascular Status Stable, Respiratory Function Stable, Patent Airway and No signs of Nausea or vomiting  Post-op Vital Signs: Reviewed and stable  Complications: No apparent anesthesia complications

## 2012-06-08 NOTE — Progress Notes (Signed)
Increased peep to 5, BP stable now.

## 2012-06-08 NOTE — Preoperative (Signed)
Beta Blockers   Reason not to administer Beta Blockers:Not Applicable 

## 2012-06-08 NOTE — Transfer of Care (Signed)
Immediate Anesthesia Transfer of Care Note  Patient: [name]  Procedure(s) Performed: Procedure(s): CORONARY ARTERY BYPASS GRAFTING (CABG) (N/A) TRANSESOPHAGEAL ECHOCARDIOGRAM (TEE) (N/A)  Patient Location: SICU  Anesthesia Type:General  Level of Consciousness: Patient remains intubated per anesthesia plan  Airway & Oxygen Therapy: Patient remains intubated per anesthesia plan and Patient placed on Ventilator (see vital sign flow sheet for setting)  Post-op Assessment: Report given to PACU RN and Post -op Vital signs reviewed and stable  Post vital signs: Reviewed and stable  Complications: No apparent anesthesia complications

## 2012-06-08 NOTE — Progress Notes (Signed)
S/p CABG X 3  Intubated  BP 150/51  Pulse 90  Temp(Src) 97 F (36.1 C) (Core (Comment))  Resp 12  Ht 5\' 2"  (1.575 m)  Wt 109 lb 2 oz (49.5 kg)  BMI 19.95 kg/m2  SpO2 100%  CI initially low- improved with volume   Intake/Output Summary (Last 24 hours) at 06/08/12 1701 Last data filed at 06/08/12 1700  Gross per 24 hour  Intake 3919.41 ml  Output   3490 ml  Net 429.41 ml   Minimal CT output  Wean to extubate

## 2012-06-08 NOTE — Progress Notes (Signed)
Utilization Review Completed. 06/08/2012  

## 2012-06-08 NOTE — Progress Notes (Cosign Needed)
Clinical Sock Work: English as a second language teacher met with pt's son Peyton Najjar who was in the room talking to nurse. CSW intern asked where patient was living prior to hospitalization. Pt was living home alone and completely independent ( still driving, grocery shopping, ect.). Per pt's son he would like for patient to return home when discharged and pt's granddaughter will move in with her temporarily. CSW intern talked about a plan B if pt was unable to return home and needed SNF. Pt's son stated he would like to cross that bridge closer to discharge time. Will follow for support.

## 2012-06-08 NOTE — Anesthesia Preprocedure Evaluation (Signed)
Anesthesia Evaluation  Patient identified by MRN, date of birth, ID band Patient awake    Reviewed: Allergy & Precautions, H&P , NPO status , Patient's Chart, lab work & pertinent test results, reviewed documented beta blocker date and time   Airway Mallampati: II TM Distance: >3 FB Neck ROM: full    Dental   Pulmonary shortness of breath,  breath sounds clear to auscultation        Cardiovascular hypertension, On Medications and On Home Beta Blockers + angina + CAD and + Peripheral Vascular Disease + Valvular Problems/Murmurs Rhythm:regular     Neuro/Psych PSYCHIATRIC DISORDERS negative neurological ROS     GI/Hepatic Neg liver ROS, GERD-  Medicated and Controlled,  Endo/Other  negative endocrine ROS  Renal/GU negative Renal ROS  negative genitourinary   Musculoskeletal   Abdominal   Peds  Hematology   Anesthesia Other Findings See surgeon's H&P   Reproductive/Obstetrics negative OB ROS                           Anesthesia Physical Anesthesia Plan  ASA: III  Anesthesia Plan: General   Post-op Pain Management:    Induction: Intravenous  Airway Management Planned: Oral ETT  Additional Equipment: Arterial line, CVP, PA Cath, TEE and Ultrasound Guidance Line Placement  Intra-op Plan:   Post-operative Plan: Post-operative intubation/ventilation  Informed Consent: I have reviewed the patients History and Physical, chart, labs and discussed the procedure including the risks, benefits and alternatives for the proposed anesthesia with the patient or authorized representative who has indicated his/her understanding and acceptance.   Dental Advisory Given  Plan Discussed with: CRNA and Surgeon  Anesthesia Plan Comments:         Anesthesia Quick Evaluation

## 2012-06-08 NOTE — Progress Notes (Signed)
No peep due to BP

## 2012-06-09 ENCOUNTER — Encounter (HOSPITAL_COMMUNITY): Payer: Self-pay | Admitting: Thoracic Surgery (Cardiothoracic Vascular Surgery)

## 2012-06-09 ENCOUNTER — Inpatient Hospital Stay (HOSPITAL_COMMUNITY): Payer: Medicare Other

## 2012-06-09 LAB — POCT I-STAT, CHEM 8
HCT: 42 % (ref 36.0–46.0)
Hemoglobin: 14.3 g/dL (ref 12.0–15.0)
Potassium: 4.2 mEq/L (ref 3.5–5.1)
Sodium: 139 mEq/L (ref 135–145)
TCO2: 23 mmol/L (ref 0–100)

## 2012-06-09 LAB — POCT I-STAT 3, ART BLOOD GAS (G3+)
Acid-base deficit: 9 mmol/L — ABNORMAL HIGH (ref 0.0–2.0)
Bicarbonate: 17.6 mEq/L — ABNORMAL LOW (ref 20.0–24.0)
Bicarbonate: 22.6 mEq/L (ref 20.0–24.0)
O2 Saturation: 99 %
Patient temperature: 36.8
TCO2: 24 mmol/L (ref 0–100)
pCO2 arterial: 45.9 mmHg — ABNORMAL HIGH (ref 35.0–45.0)
pH, Arterial: 7.299 — ABNORMAL LOW (ref 7.350–7.450)
pO2, Arterial: 145 mmHg — ABNORMAL HIGH (ref 80.0–100.0)
pO2, Arterial: 150 mmHg — ABNORMAL HIGH (ref 80.0–100.0)

## 2012-06-09 LAB — CBC
Hemoglobin: 11.4 g/dL — ABNORMAL LOW (ref 12.0–15.0)
MCH: 29.2 pg (ref 26.0–34.0)
MCHC: 34.8 g/dL (ref 30.0–36.0)
MCHC: 36 g/dL (ref 30.0–36.0)
Platelets: 84 10*3/uL — ABNORMAL LOW (ref 150–400)
Platelets: 80 10*3/uL — ABNORMAL LOW (ref 150–400)
RDW: 17 % — ABNORMAL HIGH (ref 11.5–15.5)

## 2012-06-09 LAB — BASIC METABOLIC PANEL
Calcium: 7.6 mg/dL — ABNORMAL LOW (ref 8.4–10.5)
GFR calc non Af Amer: 35 mL/min — ABNORMAL LOW (ref >90)
Glucose, Bld: 76 mg/dL (ref 70–99)
Sodium: 140 mEq/L (ref 135–145)

## 2012-06-09 LAB — CREATININE, SERUM
Creatinine, Ser: 1.48 mg/dL — ABNORMAL HIGH (ref 0.50–1.10)
GFR calc non Af Amer: 31 mL/min — ABNORMAL LOW (ref >90)

## 2012-06-09 LAB — GLUCOSE, CAPILLARY
Glucose-Capillary: 68 mg/dL — ABNORMAL LOW (ref 70–99)
Glucose-Capillary: 102 mg/dL — ABNORMAL HIGH (ref 70–99)
Glucose-Capillary: 123 mg/dL — ABNORMAL HIGH (ref 70–99)
Glucose-Capillary: 160 mg/dL — ABNORMAL HIGH (ref 70–99)

## 2012-06-09 LAB — MAGNESIUM: Magnesium: 2.9 mg/dL — ABNORMAL HIGH (ref 1.5–2.5)

## 2012-06-09 MED ORDER — INSULIN ASPART 100 UNIT/ML ~~LOC~~ SOLN
0.0000 [IU] | SUBCUTANEOUS | Status: DC
  Administered 2012-06-09 (×2): 2 [IU] via SUBCUTANEOUS
  Administered 2012-06-10: 8 [IU] via SUBCUTANEOUS
  Administered 2012-06-10: 4 [IU] via SUBCUTANEOUS
  Administered 2012-06-10: 2 [IU] via SUBCUTANEOUS

## 2012-06-09 MED ORDER — MORPHINE SULFATE 2 MG/ML IJ SOLN
1.0000 mg | INTRAMUSCULAR | Status: DC | PRN
  Administered 2012-06-10: 1 mg via INTRAVENOUS
  Filled 2012-06-09 (×2): qty 1

## 2012-06-09 MED ORDER — SODIUM BICARBONATE 8.4 % IV SOLN
100.0000 meq | Freq: Once | INTRAVENOUS | Status: AC
  Administered 2012-06-09: 100 meq via INTRAVENOUS

## 2012-06-09 MED ORDER — POTASSIUM CHLORIDE 10 MEQ/50ML IV SOLN
10.0000 meq | Freq: Once | INTRAVENOUS | Status: AC
  Administered 2012-06-09 (×2): 10 meq via INTRAVENOUS

## 2012-06-09 MED ORDER — POTASSIUM CHLORIDE 10 MEQ/50ML IV SOLN
INTRAVENOUS | Status: AC
  Filled 2012-06-09: qty 50

## 2012-06-09 MED ORDER — PRAVASTATIN SODIUM 40 MG PO TABS
80.0000 mg | ORAL_TABLET | Freq: Every day | ORAL | Status: DC
  Administered 2012-06-09 – 2012-06-13 (×5): 80 mg via ORAL
  Filled 2012-06-09 (×6): qty 2

## 2012-06-09 MED ORDER — MECLIZINE HCL 12.5 MG PO TABS: 12.5000 mg | ORAL_TABLET | Freq: Three times a day (TID) | ORAL | Status: DC | PRN

## 2012-06-09 MED ORDER — POTASSIUM CHLORIDE 10 MEQ/50ML IV SOLN
10.0000 meq | INTRAVENOUS | Status: DC | PRN
  Administered 2012-06-09 (×2): 10 meq via INTRAVENOUS
  Filled 2012-06-09: qty 50

## 2012-06-09 MED ORDER — FUROSEMIDE 10 MG/ML IJ SOLN
40.0000 mg | Freq: Once | INTRAMUSCULAR | Status: AC
  Administered 2012-06-09: 40 mg via INTRAVENOUS
  Filled 2012-06-09: qty 4

## 2012-06-09 MED ORDER — INSULIN ASPART 100 UNIT/ML ~~LOC~~ SOLN
0.0000 [IU] | SUBCUTANEOUS | Status: DC
  Administered 2012-06-09 (×2): 2 [IU] via SUBCUTANEOUS

## 2012-06-09 MED ORDER — METOPROLOL TARTRATE 25 MG/10 ML ORAL SUSPENSION
25.0000 mg | Freq: Two times a day (BID) | ORAL | Status: DC
  Administered 2012-06-09 – 2012-06-10 (×2): 25 mg
  Filled 2012-06-09 (×4): qty 10

## 2012-06-09 MED ORDER — TRAMADOL HCL 50 MG PO TABS: 50.0000 mg | ORAL_TABLET | Freq: Four times a day (QID) | ORAL | Status: DC | PRN

## 2012-06-09 MED ORDER — METOPROLOL TARTRATE 25 MG PO TABS
25.0000 mg | ORAL_TABLET | Freq: Two times a day (BID) | ORAL | Status: DC
  Administered 2012-06-09 – 2012-06-14 (×9): 25 mg via ORAL
  Filled 2012-06-09 (×12): qty 1

## 2012-06-09 MED ORDER — INSULIN ASPART 100 UNIT/ML ~~LOC~~ SOLN: 0.0000 [IU] | SUBCUTANEOUS | Status: DC

## 2012-06-09 MED ORDER — ALPRAZOLAM 0.25 MG PO TABS
0.1250 mg | ORAL_TABLET | Freq: Every day | ORAL | Status: DC | PRN
  Administered 2012-06-09: 0.125 mg via ORAL
  Filled 2012-06-09: qty 1

## 2012-06-09 MED FILL — Potassium Chloride Inj 2 mEq/ML: INTRAVENOUS | Qty: 40 | Status: AC

## 2012-06-09 MED FILL — Magnesium Sulfate Inj 50%: INTRAMUSCULAR | Qty: 10 | Status: AC

## 2012-06-09 NOTE — Progress Notes (Signed)
K3.6  TCTS potassium protocol initiated

## 2012-06-09 NOTE — Progress Notes (Signed)
Begin rapid weaning per protocol.

## 2012-06-09 NOTE — Progress Notes (Signed)
Patient extubated per protocol and placed on 4L nasal cannula. Patient is stable. RT will continue to monitor.

## 2012-06-09 NOTE — Op Note (Signed)
[name], [name] NO.:  [id number]  MEDICAL RECORD NO.:  [id number]  LOCATION:  2302                         FACILITY:  [facility]  PHYSICIAN:  Salvatore Decent. Dorris Fetch, M.D.DATE OF BIRTH:  Nov 20, 1923  DATE OF PROCEDURE:  06/08/2012 DATE OF DISCHARGE:                              OPERATIVE REPORT   PREOPERATIVE DIAGNOSIS:  Left main and three-vessel coronary artery disease with unstable angina.  POSTOPERATIVE DIAGNOSIS:  Left main and three-vessel coronary artery disease with unstable angina plus mild aortic stenosis.  PROCEDURE:  Median sternotomy, extracorporeal circulation, coronary artery bypass grafting x3 (left internal mammary artery to left anterior descending, saphenous vein graft to obtuse marginal 1, saphenous vein graft to posterior descending).  SURGEON:  Salvatore Decent. Dorris Fetch, M.D.  ASSISTANTDenny Peon Barrett.  ANESTHESIA:  General.  FINDINGS:  Good quality targets, good quality conduits, preserved left ventricular function, transesophageal echocardiography showed mild mitral regurgitation and mild aortic stenosis prebypass.  Postbypass transesophageal echocardiography showed initial increased mitral regurgitation, which then returned to 1+.  CLINICAL NOTE:  [name] is an 79 year old woman who presented with unstable angina.  She had cardiac catheterization by Dr. Marcina Millard at [facility], which showed left main and three-vessel coronary disease.  She was referred to [facility] for coronary artery bypass grafting.  The indications, risks, benefits, and alternatives were discussed in detail with the patient.  She understood and accepted the risks and agreed to proceed.  OPERATIVE NOTE:  [name] was brought to the preoperative holding area on June 08, 2012.  There Anesthesia placed a Swan-Ganz catheter and arterial blood pressure monitoring line.  She was taken to the operating room, anesthetized, and intubated.   Intravenous antibiotics were administered.  A Foley catheter was placed.  Transesophageal echocardiography was performed.  Please refer to the separately dictated note for full details of the procedure.  She had preserved left ventricular function.  There was 1+ mitral regurgitation.  There are also was calcification at the aortic root and in the aortic valve leaflets, but overall leaflet motion was only mildly restricted.  She had mild aortic stenosis.  The chest, abdomen, and legs were prepped and draped in usual sterile fashion.  A median sternotomy was performed and the left internal mammary artery was harvested using standard technique.  Simultaneously, an incision was made in the medial aspect of the right leg at the level of the knee. The greater saphenous vein was harvested from the right leg endoscopically.  The saphenous vein was satisfactory.  The mammary artery was good quality.  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  2000 units of heparin was administered during the vessel harvest, remainder of the full heparin dose given prior to opening the pericardium.  The pericardium was opened.  There was a small pericardial effusion. The ascending aorta was inspected.  There was no evidence of atherosclerotic disease.  After confirming adequate anticoagulation with ACT measurement, the aorta was cannulated via concentric 2-0 Ethibond pledgeted pursestring sutures.  A dual-stage venous cannula was placed via pursestring suture in the right atrial appendage.  Cardiopulmonary bypass was instituted.  The patient was cooled to 32 degrees Celsius. The coronary arteries were inspected and anastomotic sites were chosen. The conduits were inspected and cut to length.  A foam pad was placed in the pericardium to insulate the heart and protect the left phrenic nerve.  A temperature probe was placed in myocardial septum and a cardioplegic  cannula was placed in the ascending aorta.  The aorta was crossclamped.  The left ventricle was emptied via the aortic root vent.  Cardiac arrest then was achieved with combination of cold antegrade blood cardioplegia and topical iced saline.  1 L of cardioplegia was administered and the myocardial septal temperature fell to 10 degrees Celsius and there was a rapid diastolic arrest.  The following distal anastomoses were performed.  First, a reverse saphenous vein graft was placed end-to-side to the posterior descending branch of the right coronary.  This was a 1.5 mm good quality target.  The vein graft was anastomosed end-to-side with a running 7-0 Prolene suture.  Each anastomosis was probed proximally and distally at completion to ensure patency.  Cardioplegia was administered at completion of each vein graft to assess flow and hemostasis.  Next, a reverse saphenous vein graft was placed end-to-side to OM 1. This vessel was superficially intramyocardial, it was 1.5 mm of good quality target.  The vein graft was anastomosed end-to-side with a running 7-0 Prolene suture.  There was excellent flow and good hemostasis.  Additional cardioplegia was administered.  The left internal mammary artery then was brought through a window in the pericardium.  The distal end was beveled.  It was anastomosed end-to-side to the distal LAD.  The LAD was a 1.5 mm good quality target.  The mammary was 1.5 mm good quality conduit.  The anastomosis was performed with a running 8-0 Prolene suture.  At the completion the mammary to LAD anastomosis, the bulldog clamps were briefly removed to inspect for hemostasis.  Septal rewarming was noted.  The bulldog clamp was replaced, the mammary pedicle was tacked to the epicardial surface of the heart with 6-0 Prolene sutures.  Additional cardioplegia was once again administered.  The vein grafts were cut to length.  The proximal vein graft anastomoses were  performed with 4.0 mm punch aortotomy and running 6-0 Prolene sutures.  At the completion of the final proximal anastomosis, the patient was placed in Trendelenburg position.  Lidocaine was administered.  The aortic root was de-aired and the aortic crossclamp was removed.  Total crossclamp time was 65 minutes.  The patient required a single defibrillation.  She was in heart block. While rewarming was completed, all proximal and distal anastomoses were inspected for hemostasis.  Epicardial pacing wires were placed on the right ventricle and right atrium and DDD pacing was initiated at 90 beats per minute.  When the patient had rewarmed to a core temperature of 37 degrees Celsius, she was weaned from cardiopulmonary bypass on the first attempt.  Total bypass time was 94 minute.  Her initial cardiac index was low and transesophageal echocardiography did show some inferior hypokinesis and worsening of her mitral regurgitation from mild- to-moderate.  A low-dose dopamine infusion was initiated and the volume was administered.  Over time, the inferior wall motion returned to normal and the mitral regurgitation return to its baseline 1+.  Along with this, the cardiac index was greater than 2 L/minute/m2.  Test dose of protamine was administered.  The atrial cannula was removed.  When attempted to tie the suture, the suture tore through  the atrium and there was significant bleeding, this was controlled by placing a digit into the atrium and then 4-0 Prolene pledgeted sutures were used to close the hole in the right atrium.  After the bleeding had been controlled, additional protamine was administered.  The aortic cannula was removed.  The remainder of protamine was administered without incident.  The chest was copiously irrigated with warm saline. Hemostasis was achieved.  The pericardium was not reapproximated.  A left pleural and single mediastinal chest tubes were placed through separate  subcostal incisions.  The sternum was closed with interrupted heavy gauge stainless steel wires.  The pectoralis fascia, subcutaneous tissue, and skin were closed in standard fashion.  All sponge, needle, and instrument counts were correct at the end of the procedure.  The patient was taken from the operating room to the surgical intensive care unit in good condition.     Salvatore Decent Dorris Fetch, M.D.     SCH/MEDQ  D:  06/08/2012  T:  06/09/2012  Job:  [id number]

## 2012-06-09 NOTE — Progress Notes (Signed)
Extubation gas:  7.27, co2 38.52, po2 145 hco3 17.6, so2 99).  Patient awake and following commands.  MD contacted and orders rec'd to give 1 amp of sodium bicarb and extubate.  During extubations parameter test, patient's systolic BP as high as 180.  5mg  IV lopressor given and nitro gtt increased from to .  Patient extubated to 4L [place].  Will continue to monitor.

## 2012-06-09 NOTE — Progress Notes (Signed)
One hour post exubation gas:  PH 7.30, pco2 45.9, po2 150, so2 99%, HCO3 22.6.  Patient is neuro intact without distress.  Able to pull 750 on incentive spirometer.  Will encourage pulmonary toilet and continue to monitor.

## 2012-06-09 NOTE — Progress Notes (Signed)
TCTS BRIEF SICU PROGRESS NOTE  1 Day Post-Op  S/P Procedure(s) (LRB): CORONARY ARTERY BYPASS GRAFTING (CABG) (N/A) TRANSESOPHAGEAL ECHOCARDIOGRAM (TEE) (N/A)   Stable day but confused and disoriented this evening NSR w/ stable BP O2 sats 95% on RA Diuresing well  Plan: Will d/c all meds which might add to confusion  Donnel Venuto H 06/09/2012 9:46 PM

## 2012-06-09 NOTE — Plan of Care (Signed)
Problem: Phase II Progression Outcomes Goal: Patient extubated within - Outcome: Completed/Met Date Met:  06/09/12 After 12 hours

## 2012-06-09 NOTE — Progress Notes (Signed)
1 Day Post-Op Procedure(s) (LRB): CORONARY ARTERY BYPASS GRAFTING (CABG) (N/A) TRANSESOPHAGEAL ECHOCARDIOGRAM (TEE) (N/A) Subjective: She has some incisional discomfort  Objective: Vital signs in last 24 hours: Temp:  [96.6 F (35.9 C)-98.2 F (36.8 C)] 98.2 F (36.8 C) (02/28 0700) Pulse Rate:  [79-95] 80 (02/28 0700) Cardiac Rhythm:  [-] Normal sinus rhythm (02/28 0700) Resp:  [12-25] 18 (02/28 0700) BP: (80-173)/(39-63) 130/48 mmHg (02/28 0700) SpO2:  [95 %-100 %] 99 % (02/28 0700) Arterial Line BP: (94-165)/(36-74) 134/48 mmHg (02/28 0700) FiO2 (%):  [39.7 %-51.3 %] 40 % (02/28 0300) Weight:  [121 lb 11.1 oz (55.2 kg)] 121 lb 11.1 oz (55.2 kg) (02/28 0600)  Hemodynamic parameters for last 24 hours: PAP: (14-36)/(5-19) 25/11 mmHg CO:  [1.9 L/min-3.6 L/min] 2.9 L/min CI:  [1.3 L/min/m2-2.4 L/min/m2] 1.9 L/min/m2  Intake/Output from previous day: 02/27 0701 - 02/28 0700 In: 5365.9 [P.O.:90; I.V.:3010.9; Blood:845; NG/GT:120; IV Piggyback:1300] Out: 4660 [Urine:2130; Emesis/NG output:150; Blood:2000; Chest Tube:380] Intake/Output this shift:    General appearance: alert and no distress Neurologic: intact Heart: regular rate and rhythm Lungs: diminished breath sounds bibasilar Abdomen: normal findings: soft, non-tender  Lab Results:  Recent Labs  06/08/12 1900 06/08/12 1906 06/09/12 0350  WBC 14.0*  --  15.8*  HGB 12.3 11.6* 11.4*  HCT 35.0* 34.0* 32.8*  PLT 81*  --  84*   BMET:  Recent Labs  06/08/12 0435  06/08/12 1906 06/09/12 0350  NA 137  < > 137 140  K 4.2  < > 4.1 3.6  CL 106  --  109 109  CO2 22  --   --  22  GLUCOSE 98  < > 189* 76  BUN 18  --  14 17  CREATININE 1.32*  < > 1.20* 1.34*  CALCIUM 8.8  --   --  7.6*  < > = values in this interval not displayed.  PT/INR:  Recent Labs  06/08/12 1308  LABPROT 17.0*  INR 1.42   ABG    Component Value Date/Time   PHART 7.299* 06/09/2012 0358   HCO3 22.6 06/09/2012 0358   TCO2 24 06/09/2012  0358   ACIDBASEDEF 4.0* 06/09/2012 0358   O2SAT 99.0 06/09/2012 0358   CBG (last 3)   Recent Labs  06/09/12 0356 06/09/12 0450 06/09/12 0648  GLUCAP 68* 102* 123*    Assessment/Plan: S/P Procedure(s) (LRB): CORONARY ARTERY BYPASS GRAFTING (CABG) (N/A) TRANSESOPHAGEAL ECHOCARDIOGRAM (TEE) (N/A) POD # 1 CABG CV- stable. Hypertensive- increase lopressor, restart lisinopril tomorrow  Dc swan  RESP- mils bibasilar atelectasis- pulmonary hygiene  RENAL- creatinine at baseline, lytes OK  Diurese, supplement K  CBG- no history of DM, well controlled, SSI PRN  Anemia secondary to ABL- better after transfusion intraoperatively  Thrombocytopenia- stable, but avoid heparin, lovenox   LOS: 3 days    Abria Vannostrand C 06/09/2012

## 2012-06-09 NOTE — Procedures (Signed)
Extubation Procedure Note  Patient Details:   Name: [name] DOB: 11-19-1923 MRN: [id number]   Airway Documentation:     Evaluation  O2 sats: stable throughout Complications: No apparent complications Patient did tolerate procedure well. Bilateral Breath Sounds: Clear   Yes  [name] J 06/09/2012, 3:07 AM

## 2012-06-10 ENCOUNTER — Inpatient Hospital Stay (HOSPITAL_COMMUNITY): Payer: Medicare Other

## 2012-06-10 LAB — CBC
MCV: 84.7 fL (ref 78.0–100.0)
Platelets: 87 10*3/uL — ABNORMAL LOW (ref 150–400)
RBC: 3.93 MIL/uL (ref 3.87–5.11)
RDW: 16.7 % — ABNORMAL HIGH (ref 11.5–15.5)
WBC: 15.9 10*3/uL — ABNORMAL HIGH (ref 4.0–10.5)

## 2012-06-10 LAB — GLUCOSE, CAPILLARY
Glucose-Capillary: 111 mg/dL — ABNORMAL HIGH (ref 70–99)
Glucose-Capillary: 135 mg/dL — ABNORMAL HIGH (ref 70–99)
Glucose-Capillary: 203 mg/dL — ABNORMAL HIGH (ref 70–99)
Glucose-Capillary: 107 mg/dL — ABNORMAL HIGH (ref 70–99)

## 2012-06-10 LAB — BASIC METABOLIC PANEL
CO2: 23 mEq/L (ref 19–32)
Calcium: 7.8 mg/dL — ABNORMAL LOW (ref 8.4–10.5)
GFR calc Af Amer: 36 mL/min — ABNORMAL LOW (ref >90)
GFR calc non Af Amer: 31 mL/min — ABNORMAL LOW (ref >90)
Sodium: 135 mEq/L (ref 135–145)

## 2012-06-10 MED ORDER — LABETALOL HCL 5 MG/ML IV SOLN
10.0000 mg | INTRAVENOUS | Status: DC | PRN
  Administered 2012-06-10 – 2012-06-12 (×5): 10 mg via INTRAVENOUS
  Filled 2012-06-10 (×4): qty 4

## 2012-06-10 MED ORDER — MECLIZINE HCL 12.5 MG PO TABS: 12.5000 mg | ORAL_TABLET | Freq: Three times a day (TID) | ORAL | Status: DC | PRN

## 2012-06-10 MED ORDER — FUROSEMIDE 10 MG/ML IJ SOLN
20.0000 mg | Freq: Two times a day (BID) | INTRAMUSCULAR | Status: AC
  Administered 2012-06-10 – 2012-06-11 (×2): 20 mg via INTRAVENOUS
  Filled 2012-06-10: qty 2

## 2012-06-10 MED ORDER — LABETALOL HCL 5 MG/ML IV SOLN
INTRAVENOUS | Status: AC
  Administered 2012-06-10: 10 mg via INTRAVENOUS
  Filled 2012-06-10: qty 4

## 2012-06-10 MED ORDER — POTASSIUM CHLORIDE 10 MEQ/50ML IV SOLN
10.0000 meq | INTRAVENOUS | Status: AC
  Administered 2012-06-10 (×2): 10 meq via INTRAVENOUS

## 2012-06-10 NOTE — Progress Notes (Signed)
   CARDIOTHORACIC SURGERY PROGRESS NOTE   R2 Days Post-Op Procedure(s) (LRB): CORONARY ARTERY BYPASS GRAFTING (CABG) (N/A) TRANSESOPHAGEAL ECHOCARDIOGRAM (TEE) (N/A)  Subjective: Disoriented and confused overnight.  Currently awake and alert, oriented to name.  Denies pain, SOB.  Ate a little breakfast.  Still intermittently pulling at lines and trying to get up.  Objective: Vital signs: BP Readings from Last 1 Encounters:  06/10/12 136/52   Pulse Readings from Last 1 Encounters:  06/10/12 79   Resp Readings from Last 1 Encounters:  06/10/12 17   Temp Readings from Last 1 Encounters:  06/10/12 97.8 F (36.6 C) Oral    Hemodynamics:    Physical Exam:  Rhythm:   sinus  Breath sounds: Clear but diminished at bases L>R  Heart sounds:  RRR  Incisions:  Dressings dry, intact  Abdomen:  Soft, non-distended, non-tender  Extremities:  Warm, well-perfused   Intake/Output from previous day: 02/28 0701 - 03/01 0700 In: 1017 [P.O.:360; I.V.:507; IV Piggyback:150] Out: 2440 [Urine:2420; Chest Tube:20] Intake/Output this shift: Total I/O In: 400 [P.O.:360; I.V.:40] Out: 45 [Urine:45]  Lab Results:  Recent Labs  06/09/12 1630 06/09/12 1642 06/10/12 0400  WBC 16.9*  --  15.9*  HGB 11.7* 14.3 11.4*  HCT 32.5* 42.0 33.3*  PLT 80*  --  87*   BMET:  Recent Labs  06/09/12 0350  06/09/12 1642 06/10/12 0400  NA 140  --  139 135  K 3.6  --  4.2 4.0  CL 109  --  106 101  CO2 22  --   --  23  GLUCOSE 76  --  128* 131*  BUN 17  --  18 21  CREATININE 1.34*  < > 1.60* 1.47*  CALCIUM 7.6*  --   --  7.8*  < > = values in this interval not displayed.  CBG (last 3)   Recent Labs  06/09/12 2321 06/10/12 0301 06/10/12 0718  GLUCAP 111* 135* 88   ABG    Component Value Date/Time   PHART 7.299* 06/09/2012 0358   HCO3 22.6 06/09/2012 0358   TCO2 23 06/09/2012 1642   ACIDBASEDEF 4.0* 06/09/2012 0358   O2SAT 99.0 06/09/2012 0358   CXR: *RADIOLOGY REPORT*  Clinical  Data: Status post cardiac surgery.  PORTABLE CHEST - 1 VIEW  Comparison: 06/09/2012.  Findings: A Swan-Ganz catheter has been removed. The Swan-Ganz  catheter has been removed. The right IJ sheath remains. The  mediastinal drain and left-sided chest tube have been removed.  There is no significant pneumothorax.  The left pleural effusion and basilar airspace disease persist.  Overall lung volumes are stable.  IMPRESSION:  1. Interval removal of left-sided chest tube, mediastinal drain,  and Swan-Ganz catheter.  2. Persistent left pleural effusion and basilar airspace disease,  likely representing atelectasis.  Original Report Authenticated By: Marin Roberts, M.D.    Assessment/Plan: S/P Procedure(s) (LRB): CORONARY ARTERY BYPASS GRAFTING (CABG) (N/A) TRANSESOPHAGEAL ECHOCARDIOGRAM (TEE) (N/A)  Overall stable POD2 Hypertensive overnight, under better control this am Post op delerium, mild Expected post op acute blood loss anemia, mild, stable Expected post op volume excess, mild, diuresing Post op atelectasis L>R Acute on chronic renal insufficiency, mild, stable   Mobilize  Diuresis  D/C central line  D/C foley  Minimize sedatives  Keep in SICU until mental status improved  [name],[name] 06/10/2012 10:06 AM

## 2012-06-10 NOTE — Progress Notes (Signed)
Hypoglycemic Event  CBG: 60  Treatment: 15 GM carbohydrate snack  Symptoms: None  Follow-up CBG: Time:1630 CBG Result:77    Possible Reasons for Event: Inadequate meal intake  Comments/MD notified:will cont to monitor    [name]  Remember to initiate Hypoglycemia Order Set & complete

## 2012-06-10 NOTE — Progress Notes (Signed)
Pt continues to attempt to get out of bed and pulling at monitoring equipment and lines.  Pt is combative with staff attempting to put equipment back on.  Frequent contacts with pt to reorient and reassure pt.  Bed alarm remains on.  Will continue to monitor.

## 2012-06-10 NOTE — Progress Notes (Signed)
Repeat 2.5mg  IV lopressor for continued elevated BP.  Will continue to monitor.

## 2012-06-10 NOTE — Progress Notes (Signed)
Pt continues to try to get out of bed unassisted and continues to pull at monitoring equipment and lines.  Safety mitts ordered but patient refuses to wear them.  Pt refuses to wear pulse ox and BP cuff.  Bed alarm remains on.  Will continue to monitor.

## 2012-06-10 NOTE — Progress Notes (Signed)
After sleeping for approximately 4 hours pt awoke confused and wanting to leave.  Pt stated she needed to "go home and do stuff".  Reoriented pt with no success.  Pt again pulling at lines and monitoring equipment.  Pt again refusing to wear [place] O2 and pulse ox.  Ordered activity apron for pt.  Nurse at bedside to provide company to pt.  Will continue to monitor.

## 2012-06-10 NOTE — Progress Notes (Signed)
Pt BP continues to remain elevated.  Paged Dr. Cornelius Moras and received new orders for Labetalol IV 10 mg Q30 minutes for SBP >150.  Will carry out orders and continue to monitor.

## 2012-06-10 NOTE — Progress Notes (Signed)
T[facility] BRIEF SICU PROGRESS NOTE  2 Days Post-Op  S/P Procedure(s) (LRB): CORONARY ARTERY BYPASS GRAFTING (CABG) (N/A) TRANSESOPHAGEAL ECHOCARDIOGRAM (TEE) (N/A)   Stable day but still quite confused  Plan: Continue current plan  Purcell Nails 06/10/2012 6:43 PM

## 2012-06-10 NOTE — Progress Notes (Signed)
Hypoglycemic Event  CBG: 69   Treatment: 15 GM carbohydrate snack  Symptoms: None  Follow-up CBG: Time:1745 CBG Result:107   Possible Reasons for Event: Inadequate meal intake  Comments/MD notified:will cont to monitor    Teodora Medici Ngox  Remember to initiate Hypoglycemia Order Set & complete

## 2012-06-10 NOTE — Progress Notes (Signed)
Pt BP 179/116.  2.5mg  IV Lopressor given.  Will continue to monitor.

## 2012-06-10 NOTE — Progress Notes (Signed)
Pt becoming more confused.  Pt attempting to get out of bed unassisted and pulling at lines.  Reoriented pt and reminded not to get out of bed.  Bed alarm on.  Will continue to monitor.

## 2012-06-11 LAB — TYPE AND SCREEN
Antibody Screen: NEGATIVE
Unit division: 0
Unit division: 0
Unit division: 0

## 2012-06-11 LAB — BASIC METABOLIC PANEL
Calcium: 7.6 mg/dL — ABNORMAL LOW (ref 8.4–10.5)
Creatinine, Ser: 1.72 mg/dL — ABNORMAL HIGH (ref 0.50–1.10)
GFR calc Af Amer: 30 mL/min — ABNORMAL LOW (ref >90)

## 2012-06-11 LAB — CBC
MCH: 28.8 pg (ref 26.0–34.0)
MCV: 85.6 fL (ref 78.0–100.0)
Platelets: 105 10*3/uL — ABNORMAL LOW (ref 150–400)
RDW: 16.8 % — ABNORMAL HIGH (ref 11.5–15.5)

## 2012-06-11 LAB — GLUCOSE, CAPILLARY
Glucose-Capillary: 105 mg/dL — ABNORMAL HIGH (ref 70–99)
Glucose-Capillary: 109 mg/dL — ABNORMAL HIGH (ref 70–99)
Glucose-Capillary: 106 mg/dL — ABNORMAL HIGH (ref 70–99)

## 2012-06-11 MED ORDER — HALOPERIDOL LACTATE 5 MG/ML IJ SOLN: 2.0000 mg | Freq: Four times a day (QID) | INTRAMUSCULAR | Status: DC | PRN

## 2012-06-11 MED ORDER — ENOXAPARIN SODIUM 30 MG/0.3ML ~~LOC~~ SOLN
30.0000 mg | SUBCUTANEOUS | Status: DC
  Administered 2012-06-11 – 2012-06-13 (×3): 30 mg via SUBCUTANEOUS
  Filled 2012-06-11 (×4): qty 0.3

## 2012-06-11 MED ORDER — AMIODARONE HCL IN DEXTROSE 360-4.14 MG/200ML-% IV SOLN
0.5000 mg/min | INTRAVENOUS | Status: DC
  Administered 2012-06-11 – 2012-06-12 (×2): 0.5 mg/min via INTRAVENOUS
  Filled 2012-06-11 (×4): qty 200

## 2012-06-11 MED ORDER — AMIODARONE LOAD VIA INFUSION
150.0000 mg | Freq: Once | INTRAVENOUS | Status: AC
  Administered 2012-06-11: 150 mg via INTRAVENOUS
  Filled 2012-06-11: qty 83.34

## 2012-06-11 MED ORDER — AMIODARONE HCL IN DEXTROSE 360-4.14 MG/200ML-% IV SOLN
INTRAVENOUS | Status: AC
  Administered 2012-06-11: 1 mg/min via INTRAVENOUS
  Filled 2012-06-11: qty 200

## 2012-06-11 MED ORDER — AMIODARONE HCL IN DEXTROSE 360-4.14 MG/200ML-% IV SOLN
1.0000 mg/min | INTRAVENOUS | Status: AC
  Administered 2012-06-11: 1 mg/min via INTRAVENOUS
  Filled 2012-06-11: qty 200

## 2012-06-11 MED ORDER — HALOPERIDOL LACTATE 5 MG/ML IJ SOLN
2.0000 mg | Freq: Four times a day (QID) | INTRAMUSCULAR | Status: DC | PRN
  Filled 2012-06-11: qty 0.4

## 2012-06-11 MED ORDER — HALOPERIDOL LACTATE 5 MG/ML IJ SOLN
INTRAMUSCULAR | Status: AC
  Administered 2012-06-11: 2 mg via INTRAVENOUS
  Filled 2012-06-11: qty 1

## 2012-06-11 NOTE — Progress Notes (Signed)
Patient converted from rapid atrial fibrillation to NSR after completion of amiodarone bolus and about 30 minutes of the 1 mg/minute amiodarone gtt.  Patient's BP symptomatic after amiodarone bolus and chemical restraint.  Once in NSR, patient paced VVI at 90 for blood pressure.

## 2012-06-11 NOTE — Progress Notes (Addendum)
   CARDIOTHORACIC SURGERY PROGRESS NOTE   R3 Days Post-Op Procedure(s) (LRB): CORONARY ARTERY BYPASS GRAFTING (CABG) (N/A) TRANSESOPHAGEAL ECHOCARDIOGRAM (TEE) (N/A)  Subjective: Looks good right now but had a very bad night.  Went into rapid Afib and developed severe agitation and delirium associated w/ SOB.  Converted back to sinus rhythm w/ IV amiodarone and finally calmed down after receiving 1 dose IV haldol.  Currently awake and alert, oriented x2, with mental status improved in c/w yesterday.  Denies pain. Denies SOB. Ate a little breakfast.  Objective: Vital signs: BP Readings from Last 1 Encounters:  06/11/12 113/65   Pulse Readings from Last 1 Encounters:  06/11/12 72   Resp Readings from Last 1 Encounters:  06/11/12 18   Temp Readings from Last 1 Encounters:  06/11/12 97.8 F (36.6 C) Oral    Hemodynamics:    Physical Exam:  Rhythm:   sinus  Breath sounds: clear  Heart sounds:  RRR  Incisions:  Clean and dry  Abdomen:  soft  Extremities:  Warm, well perfused   Intake/Output from previous day: 03/01 0701 - 03/02 0700 In: 1267.7 [P.O.:720; I.V.:397.7; IV Piggyback:150] Out: 235 [Urine:235] Intake/Output this shift:    Lab Results:  Recent Labs  06/10/12 0400 06/11/12 0530  WBC 15.9* 13.9*  HGB 11.4* 11.0*  HCT 33.3* 32.7*  PLT 87* 105*   BMET:  Recent Labs  06/10/12 0400 06/11/12 0530  NA 135 137  K 4.0 4.0  CL 101 103  CO2 23 23  GLUCOSE 131* 98  BUN 21 34*  CREATININE 1.47* 1.72*  CALCIUM 7.8* 7.6*    CBG (last 3)   Recent Labs  06/10/12 1938 06/10/12 2342 06/11/12 0410  GLUCAP 188* 95 105*   ABG    Component Value Date/Time   PHART 7.299* 06/09/2012 0358   HCO3 22.6 06/09/2012 0358   TCO2 23 06/09/2012 1642   ACIDBASEDEF 4.0* 06/09/2012 0358   O2SAT 99.0 06/09/2012 0358   CXR: n/a   Assessment/Plan: S/P Procedure(s) (LRB): CORONARY ARTERY BYPASS GRAFTING (CABG) (N/A) TRANSESOPHAGEAL ECHOCARDIOGRAM (TEE)  (N/A)  Overall stable POD3  Episode rapid Afib overnight, now NSR on amiodarone Post op delerium, bad overnight but seems improved today Acute on chronic renal insufficiency, BUN/Cr increased today Expected post op volume excess, weight still above baseline but appears intravascularly dry at present Expected post op acute blood loss anemia, mild, stable  Post op atelectasis L>R  No h/o diabetes and CBG's normal  Mobilize  Hold diuretics today and watch renal function  Continue amiodarone IV today Stop cbg's and insulin Keep in SICU until mental status improved Haldol only for severe agitation   OWEN,CLARENCE H 06/11/2012 9:27 AM

## 2012-06-11 NOTE — Progress Notes (Signed)
  Amiodarone Drug - Drug Interaction Consult Note  Recommendations:  Amiodarone is metabolized by the cytochrome P450 system and therefore has the potential to cause many drug interactions. Amiodarone has an average plasma half-life of 50 days (range 20 to 100 days).   There is potential for drug interactions to occur several weeks or months after stopping treatment and the onset of drug interactions may be slow after initiating amiodarone.   [x]  Statins: Increased risk of myopathy. Simvastatin- restrict dose to 20mg  daily. Other statins: counsel patients to report any muscle pain or weakness immediately.  []  Anticoagulants: Amiodarone can increase anticoagulant effect. Consider warfarin dose reduction. Patients should be monitored closely and the dose of anticoagulant altered accordingly, remembering that amiodarone levels take several weeks to stabilize.  []  Antiepileptics: Amiodarone can increase plasma concentration of phenytoin, the dose should be reduced. Note that small changes in phenytoin dose can result in large changes in levels. Monitor patient and counsel on signs of toxicity.  [x]  Beta blockers: increased risk of bradycardia, AV block and myocardial depression. Sotalol - avoid concomitant use.  []   Calcium channel blockers (diltiazem and verapamil): increased risk of bradycardia, AV block and myocardial depression.  []   Cyclosporine: Amiodarone increases levels of cyclosporine. Reduced dose of cyclosporine is recommended.  []  Digoxin dose should be halved when amiodarone is started.  []  Diuretics: increased risk of cardiotoxicity if hypokalemia occurs.  []  Oral hypoglycemic agents (glyburide, glipizide, glimepiride): increased risk of hypoglycemia. Patient's glucose levels should be monitored closely when initiating amiodarone therapy.   [x]  Drugs that prolong the QT interval:  Torsades de pointes risk may be increased with concurrent use - avoid if possible.  Monitor QTc,  also keep magnesium/potassium WNL if concurrent therapy can't be avoided. Marland Kitchen Antibiotics: e.g. fluoroquinolones, erythromycin. . Antiarrhythmics: e.g. quinidine, procainamide, disopyramide, sotalol. . Antipsychotics: e.g. phenothiazines, haloperidol.  . Lithium, tricyclic antidepressants, and methadone. Thank You.  Vernard Gambles, PharmD, BCPS 06/11/2012 3:06 AM

## 2012-06-11 NOTE — Progress Notes (Signed)
TCTS BRIEF SICU PROGRESS NOTE  3 Days Post-Op  S/P Procedure(s) (LRB): CORONARY ARTERY BYPASS GRAFTING (CABG) (N/A) TRANSESOPHAGEAL ECHOCARDIOGRAM (TEE) (N/A)   Stable day Maintaining NSR Less confused  Plan: Continue current plan  Britne Borelli H 06/11/2012 4:30 PM

## 2012-06-11 NOTE — Progress Notes (Signed)
Patient woke up trying to get out of bed stating "I've got to use the bathroom!"  Patient assisted to bedside commode. 1 BM.  Patient stood without instruction, spreading poop on herself with her hand.  Patient paranoid that staff were going to kill her, stating to staff that entered to help "are you in on this too?"  We convinced her to get cleaned up while preventing patient from hitting and pinching staff.  During this time, patient entered rapid atrial fibrillation.  BP asymptomatic.  Patient refused to return to bed.  Ambulated in hall x 600 ft.  Patient agreed to sit in a wheelchair and returned to room.  Dr. Cornelius Moras called after patient HR did not respond to 5 mg IV lopressor.  Dr. Cornelius Moras also informed of agitation and combative behaviors.  Orders received.

## 2012-06-12 ENCOUNTER — Inpatient Hospital Stay (HOSPITAL_COMMUNITY): Payer: Medicare Other

## 2012-06-12 LAB — CBC
MCV: 85.2 fL (ref 78.0–100.0)
Platelets: 150 10*3/uL (ref 150–400)
RBC: 3.93 MIL/uL (ref 3.87–5.11)
RDW: 16.8 % — ABNORMAL HIGH (ref 11.5–15.5)
WBC: 9.6 10*3/uL (ref 4.0–10.5)

## 2012-06-12 LAB — BASIC METABOLIC PANEL
CO2: 22 mEq/L (ref 19–32)
Calcium: 7.9 mg/dL — ABNORMAL LOW (ref 8.4–10.5)
Creatinine, Ser: 1.74 mg/dL — ABNORMAL HIGH (ref 0.50–1.10)
GFR calc Af Amer: 29 mL/min — ABNORMAL LOW (ref >90)
Sodium: 135 mEq/L (ref 135–145)

## 2012-06-12 MED ORDER — ALPRAZOLAM 0.25 MG PO TABS
0.1250 mg | ORAL_TABLET | Freq: Two times a day (BID) | ORAL | Status: DC | PRN
  Administered 2012-06-13 (×2): 0.125 mg via ORAL
  Filled 2012-06-12 (×2): qty 1

## 2012-06-12 MED ORDER — AMIODARONE HCL 200 MG PO TABS
400.0000 mg | ORAL_TABLET | Freq: Two times a day (BID) | ORAL | Status: DC
  Administered 2012-06-12 – 2012-06-14 (×5): 400 mg via ORAL
  Filled 2012-06-12 (×6): qty 2

## 2012-06-12 MED ORDER — FUROSEMIDE 40 MG PO TABS
40.0000 mg | ORAL_TABLET | Freq: Every day | ORAL | Status: DC
  Administered 2012-06-13 – 2012-06-14 (×2): 40 mg via ORAL
  Filled 2012-06-12 (×2): qty 1

## 2012-06-12 MED ORDER — SODIUM CHLORIDE 0.9 % IV SOLN
250.0000 mL | INTRAVENOUS | Status: DC | PRN
Start: 2012-06-12 — End: 2012-06-14

## 2012-06-12 MED ORDER — MOVING RIGHT ALONG BOOK
Freq: Once | Status: DC
  Filled 2012-06-12: qty 1

## 2012-06-12 MED ORDER — SODIUM CHLORIDE 0.9 % IJ SOLN
3.0000 mL | Freq: Two times a day (BID) | INTRAMUSCULAR | Status: DC
  Administered 2012-06-12 – 2012-06-13 (×3): 3 mL via INTRAVENOUS

## 2012-06-12 MED ORDER — ALUM & MAG HYDROXIDE-SIMETH 200-200-20 MG/5ML PO SUSP: 15.0000 mL | ORAL | Status: DC | PRN

## 2012-06-12 MED ORDER — MAGNESIUM HYDROXIDE 400 MG/5ML PO SUSP: 30.0000 mL | Freq: Every day | ORAL | Status: DC | PRN

## 2012-06-12 MED ORDER — SODIUM CHLORIDE 0.9 % IJ SOLN: 3.0000 mL | INTRAMUSCULAR | Status: DC | PRN

## 2012-06-12 MED ORDER — AMLODIPINE BESYLATE 5 MG PO TABS
5.0000 mg | ORAL_TABLET | Freq: Every day | ORAL | Status: DC
  Administered 2012-06-12 – 2012-06-13 (×2): 5 mg via ORAL
  Filled 2012-06-12 (×3): qty 1

## 2012-06-12 MED FILL — Sodium Chloride IV Soln 0.9%: INTRAVENOUS | Qty: 1000 | Status: AC

## 2012-06-12 MED FILL — Lidocaine HCl IV Inj 20 MG/ML: INTRAVENOUS | Qty: 5 | Status: AC

## 2012-06-12 MED FILL — Sodium Chloride Irrigation Soln 0.9%: Qty: 3000 | Status: AC

## 2012-06-12 MED FILL — Albumin, Human Inj 5%: INTRAVENOUS | Qty: 250 | Status: AC

## 2012-06-12 MED FILL — Electrolyte-R (PH 7.4) Solution: INTRAVENOUS | Qty: 4000 | Status: AC

## 2012-06-12 MED FILL — Sodium Bicarbonate IV Soln 8.4%: INTRAVENOUS | Qty: 100 | Status: AC

## 2012-06-12 MED FILL — Mannitol IV Soln 20%: INTRAVENOUS | Qty: 500 | Status: AC

## 2012-06-12 MED FILL — Heparin Sodium (Porcine) Inj 1000 Unit/ML: INTRAMUSCULAR | Qty: 10 | Status: AC

## 2012-06-12 MED FILL — Heparin Sodium (Porcine) Inj 1000 Unit/ML: INTRAMUSCULAR | Qty: 30 | Status: AC

## 2012-06-12 NOTE — Progress Notes (Signed)
4 Days Post-Op Procedure(s) (LRB): CORONARY ARTERY BYPASS GRAFTING (CABG) (N/A) TRANSESOPHAGEAL ECHOCARDIOGRAM (TEE) (N/A) Subjective: Feels well this AM Denies pain  Objective: Vital signs in last 24 hours: Temp:  [97.6 F (36.4 C)-98.2 F (36.8 C)] 97.8 F (36.6 C) (03/03 0716) Pulse Rate:  [66-80] 72 (03/03 0700) Cardiac Rhythm:  [-] Normal sinus rhythm (03/03 0700) Resp:  [15-22] 20 (03/03 0700) BP: (111-172)/(35-99) 148/57 mmHg (03/03 0700) SpO2:  [94 %-97 %] 96 % (03/03 0700) Weight:  [119 lb 8 oz (54.205 kg)] 119 lb 8 oz (54.205 kg) (03/03 0500)  Hemodynamic parameters for last 24 hours:    Intake/Output from previous day: 03/02 0701 - 03/03 0700 In: 937.4 [P.O.:60; I.V.:877.4] Out: -  Intake/Output this shift:    General appearance: alert and no distress Neurologic: no focal deficit, oriented x 3 Heart: regular rate and rhythm Lungs: diminished breath sounds left base Wound: clean and dry  Lab Results:  Recent Labs  06/10/12 0400 06/11/12 0530  WBC 15.9* 13.9*  HGB 11.4* 11.0*  HCT 33.3* 32.7*  PLT 87* 105*   BMET:  Recent Labs  06/10/12 0400 06/11/12 0530  NA 135 137  K 4.0 4.0  CL 101 103  CO2 23 23  GLUCOSE 131* 98  BUN 21 34*  CREATININE 1.47* 1.72*  CALCIUM 7.8* 7.6*    PT/INR: No results found for this basename: LABPROT, INR,  in the last 72 hours ABG    Component Value Date/Time   PHART 7.299* 06/09/2012 0358   HCO3 22.6 06/09/2012 0358   TCO2 23 06/09/2012 1642   ACIDBASEDEF 4.0* 06/09/2012 0358   O2SAT 99.0 06/09/2012 0358   CBG (last 3)   Recent Labs  06/11/12 0410 06/11/12 0741 06/11/12 1151  GLUCAP 105* 109* 106*    Assessment/Plan: S/P Procedure(s) (LRB): CORONARY ARTERY BYPASS GRAFTING (CABG) (N/A) TRANSESOPHAGEAL ECHOCARDIOGRAM (TEE) (N/A) - NEURO- delirium improved  CV- in SR on lopressor and amiodarone- change amiodarone to PO  RESP- some LLL atelectasis on CXR- continue IS  RENAL- labs pending, creatinine  was elevated yesterday- will see where it is today  Dc CBG/ SSI   LOS: 6 days    HENDRICKSON,STEVEN C 06/12/2012

## 2012-06-13 DIAGNOSIS — Z951 Presence of aortocoronary bypass graft: Secondary | ICD-10-CM

## 2012-06-13 LAB — CBC
HCT: 32.1 % — ABNORMAL LOW (ref 36.0–46.0)
RBC: 3.76 MIL/uL — ABNORMAL LOW (ref 3.87–5.11)
RDW: 16.8 % — ABNORMAL HIGH (ref 11.5–15.5)
WBC: 7.1 10*3/uL (ref 4.0–10.5)

## 2012-06-13 LAB — BASIC METABOLIC PANEL
BUN: 31 mg/dL — ABNORMAL HIGH (ref 6–23)
CO2: 21 mEq/L (ref 19–32)
Chloride: 103 mEq/L (ref 96–112)
GFR calc Af Amer: 33 mL/min — ABNORMAL LOW (ref >90)
Potassium: 3.3 mEq/L — ABNORMAL LOW (ref 3.5–5.1)

## 2012-06-13 MED ORDER — ACETAMINOPHEN 500 MG PO TABS: 1000.0000 mg | ORAL_TABLET | Freq: Four times a day (QID) | ORAL | Status: DC | PRN

## 2012-06-13 MED ORDER — DSS 100 MG PO CAPS: 200.0000 mg | ORAL_CAPSULE | Freq: Every day | ORAL | Status: DC

## 2012-06-13 MED ORDER — AMLODIPINE BESYLATE 5 MG PO TABS: 5.0000 mg | ORAL_TABLET | Freq: Every day | ORAL | Status: DC

## 2012-06-13 MED ORDER — AMIODARONE HCL 200 MG PO TABS: 200.0000 mg | ORAL_TABLET | Freq: Two times a day (BID) | ORAL | Status: DC

## 2012-06-13 MED ORDER — POTASSIUM CHLORIDE ER 20 MEQ PO TBCR: 20.0000 meq | EXTENDED_RELEASE_TABLET | Freq: Every day | ORAL | Status: DC

## 2012-06-13 MED ORDER — FUROSEMIDE 40 MG PO TABS: 40.0000 mg | ORAL_TABLET | Freq: Every day | ORAL | Status: DC

## 2012-06-13 NOTE — Progress Notes (Signed)
5 Days Post-Op Procedure(s) (LRB): CORONARY ARTERY BYPASS GRAFTING (CABG) (N/A) TRANSESOPHAGEAL ECHOCARDIOGRAM (TEE) (N/A) Subjective: No complaints this AM, denies pain  Objective: Vital signs in last 24 hours: Temp:  [97.4 F (36.3 C)-98.4 F (36.9 C)] 97.9 F (36.6 C) (03/04 0300) Pulse Rate:  [73-86] 80 (03/04 0300) Cardiac Rhythm:  [-] Normal sinus rhythm (03/03 2010) Resp:  [18-24] 19 (03/04 0300) BP: (129-185)/(46-72) 157/62 mmHg (03/04 0622) SpO2:  [92 %-99 %] 94 % (03/04 0300) Weight:  [119 lb 4.3 oz (54.1 kg)] 119 lb 4.3 oz (54.1 kg) (03/04 0300)  Hemodynamic parameters for last 24 hours:    Intake/Output from previous day: 03/03 0701 - 03/04 0700 In: 663.5 [P.O.:480; I.V.:183.5] Out: 650 [Urine:650] Intake/Output this shift:    General appearance: alert and no distress Neurologic: intact Heart: regular rate and rhythm Lungs: diminished BS left base Wound: clean and dry  Lab Results:  Recent Labs  06/12/12 0742 06/13/12 0525  WBC 9.6 7.1  HGB 11.3* 10.9*  HCT 33.5* 32.1*  PLT 150 164   BMET:  Recent Labs  06/12/12 0742 06/13/12 0525  NA 135 136  K 3.7 3.3*  CL 101 103  CO2 22 21  GLUCOSE 108* 91  BUN 34* 31*  CREATININE 1.74* 1.59*  CALCIUM 7.9* 7.6*    PT/INR: No results found for this basename: LABPROT, INR,  in the last 72 hours ABG    Component Value Date/Time   PHART 7.299* 06/09/2012 0358   HCO3 22.6 06/09/2012 0358   TCO2 23 06/09/2012 1642   ACIDBASEDEF 4.0* 06/09/2012 0358   O2SAT 99.0 06/09/2012 0358   CBG (last 3)   Recent Labs  06/11/12 0410 06/11/12 0741 06/11/12 1151  GLUCAP 105* 109* 106*    Assessment/Plan: S/P Procedure(s) (LRB): CORONARY ARTERY BYPASS GRAFTING (CABG) (N/A) TRANSESOPHAGEAL ECHOCARDIOGRAM (TEE) (N/A) - Maintaining SR on loperssor and amiodarone HTN- amlodipine added yesterday, will restart lisinopril tomorrow if creatinine down again ATN- creatinine down this AM, should return to  baseline Anemia- mild Thrombocytopenia- resolved Enoxaparin for DVT prophylaxis Ambulate Probably dc in next 24-48 hours if she continues to progress   LOS: 7 days    HENDRICKSON,STEVEN C 06/13/2012

## 2012-06-13 NOTE — Progress Notes (Signed)
CARDIAC REHAB PHASE I   PRE:  Rate/Rhythm: 80 SR  BP:  Supine:   Sitting: 180/72  Standing:    SaO2: 97% RA  MODE:  Ambulation: 430 ft   POST:  Rate/Rhythm: 106 PACs/PVCs  BP:  Supine:   Sitting: 180/72  Standing:    SaO2: 94% RA  1021-1116 Pt tolerated ambulation well with assist x 1 and pushing rolling walker, c/o legs feeling weak towards end of walk. To chair after walk with legs elevated. CABG d/c education reviewed with pt. Pt voices understanding of instructions given. Discussed Phase 2 Cardiac Rehab and pt is interested. Permission given to send contact info to CR at [facility].  [name]

## 2012-06-13 NOTE — Discharge Summary (Signed)
Physician Discharge Summary  Patient ID: [name] MRN: [id number] DOB/AGE: 77-Jul-1925 77 y.o.  Admit date: 06/06/2012 Discharge date: 06/13/2012  Admission Diagnoses: Coronary artery disease with unstable angina Patient Active Problem List  Diagnosis  . COLONIC POLYPS, ADENOMATOUS  . HYPERLIPIDEMIA  . ANEMIA-NOS  . ANXIETY DISORDER, GENERALIZED  . HYPERTENSION  . CAROTID ARTERY DISEASE  . ALLERGIC RHINITIS  . GERD  . CONSTIPATION, MILD  . SYSTEMIC LUPUS ERYTHEMATOSUS  . SJOGREN'S SYNDROME  . BACK PAIN, LUMBAR  . LEG PAIN, BILATERAL  . OSTEOPENIA  . VERTIGO   Discharge Diagnoses:   Patient Active Problem List  Diagnosis  . COLONIC POLYPS, ADENOMATOUS  . HYPERLIPIDEMIA  . ANEMIA-NOS  . ANXIETY DISORDER, GENERALIZED  . HYPERTENSION  . CAROTID ARTERY DISEASE  . ALLERGIC RHINITIS  . GERD  . CONSTIPATION, MILD  . SYSTEMIC LUPUS ERYTHEMATOSUS  . SJOGREN'S SYNDROME  . BACK PAIN, LUMBAR  . LEG PAIN, BILATERAL  . OSTEOPENIA  . VERTIGO  . S/P CABG x 3   Discharged Condition: good  History of Present Illness:   [name] is an 79 yo white female with known history of hyperlipidemia.  She underwent cardiac catheterization at [facility] for evaluation of chest pain at which time she was found to have Left Main disease and underwent PCI.  The patient was subsequently discharged home.  However she developed chest pain that radiated into her shoulders which was associated with shortness of breath.  The patient took ASA which did not relieve her chest pain and she contacted EMS who transported her to the ED at [facility].  She was treated with NTG and which did alleviate her chest pain.  Finally she complained of some right groin pain and bruising at her catheterization site.  Workup in the ED revealed negative Troponin level.  Due to continued chest pain it was felt the patient would benefit from Coronary Bypass and she was subsequently transferred for evaluation.    Hospital Course:    The patient was accepted by Dr. Dorris Fetch.  The patient remained chest pain free after transfer.  It was felt the patient would be a candidate for coronary bypass.  The risks and benefits of the procedure were explained to the patient and she was agreeable to proceed with surgery.  She was taken to the operating room on 06/08/2012.  She underwent CABG x 3 utilizing LIMA to LAD, SVG to OM1, and SVG to PDA.  She also underwent Endoscopic saphenous vein harvest of the right leg.  The patient tolerated the procedure well and was taken to the SICU in stable condition.  The patient was extubated early morning after surgery.  During her stay in the ICU the patient's chest tubes and arterial lines were removed without difficulty.  Her creatinine has been mildly elevated and her home Lisinopril will not be restarted.  The patient developed confusion and agitation overnight.  She was taken off narcotic and sedatives.  She was hypertensive and started on Norvasc.  Patient developed Atrial Fibrillation with successful conversion to NSR with use of Amiodarone.  Once the patient was medically stable she was transferred to the telemetry unit in stable condition.  She continues to make progress.  She does still have some episodes of mild confusion at night.  She is maintaining NSR.  Her pacing wires were removed without difficulty.  She is medically stable at this time.  Should no further issues arise we will plan for discharge home in the next 24-48  hours.  She will need to follow up with Dr. Dorris Fetch in 3 weeks with a CXR prior to her appointment.  She will also need to follow up with her primary Cardiologist Dr. Darrold Junker in 2-4 weeks.     Treatments: surgery:   Median sternotomy, extracorporeal circulation, coronary  artery bypass grafting x3 (left internal mammary artery to left anterior  descending, saphenous vein graft to obtuse marginal 1, saphenous vein  graft to posterior  descending).  Disposition:Home   The patient has been discharged on:   1.Beta Blocker:  Yes [x   ]                              No   [   ]                              If No, reason:  2.Ace Inhibitor/ARB: Yes [   ]                                     No  [  x  ]                                     If No, reason: Elevated Creatinine  3.Statin:   Yes [  x]                  No  [   ]                  If No, reason:  4.Ecasa:  Yes  [ x  ]                  No   [   ]                  If No, reason:       Medication List    STOP taking these medications       lisinopril 10 MG tablet  Commonly known as:  PRINIVIL,ZESTRIL      TAKE these medications       acetaminophen 500 MG tablet  Commonly known as:  TYLENOL  Take 2 tablets (1,000 mg total) by mouth every 6 (six) hours as needed for pain.     ALPRAZolam 0.25 MG tablet  Commonly known as:  XANAX  Take 0.125 mg by mouth daily as needed for anxiety.     amiodarone 200 MG tablet  Commonly known as:  PACERONE  Take 1 tablet (200 mg total) by mouth 2 (two) times daily.     amLODipine 5 MG tablet  Commonly known as:  NORVASC  Take 1 tablet (5 mg total) by mouth daily.     aspirin 325 MG tablet  Take 325 mg by mouth daily.     DSS 100 MG Caps  Take 200 mg by mouth daily.     furosemide 40 MG tablet  Commonly known as:  LASIX  Take 1 tablet (40 mg total) by mouth daily. For 5 Days     meclizine 25 MG tablet  Commonly known as:  ANTIVERT  Take 12.5-25 mg by mouth 3 (three) times daily as needed for dizziness.  metoprolol tartrate 25 MG tablet  Commonly known as:  LOPRESSOR  Take 25 mg by mouth 2 (two) times daily.     Potassium Chloride ER 20 MEQ Tbcr  Take 20 mEq by mouth daily. For 5 Days     pravastatin 80 MG tablet  Commonly known as:  PRAVACHOL  Take 40 mg by mouth at bedtime.           Follow-up Information   Follow up with Loreli Slot, MD.   Contact information:   [address] [place] [address] [phone]       Follow up with [facility]. (Please get CXR 1 hour prior to your appointment with Dr. Dorris Fetch)    Contact information:   [place]       Schedule an appointment as soon as possible for a visit with PARASCHOS,ALEXANDER, MD. (Please contact office to set up 2-4 week follow up)    Contact information:   [address] [facility] [address] [phone]       Signed: Lowella Dandy 06/13/2012, 9:55 AM

## 2012-06-13 NOTE — Progress Notes (Signed)
Patient ambulated approximately 250 ft with RW and RN and tolerated well.  Steady on her feet, no SOB, HR stable.  Left resting in bed with call bell within reach.  Will continue to monitor.  [name]

## 2012-06-14 LAB — BASIC METABOLIC PANEL
CO2: 22 mEq/L (ref 19–32)
Chloride: 102 mEq/L (ref 96–112)
Glucose, Bld: 89 mg/dL (ref 70–99)
Sodium: 138 mEq/L (ref 135–145)

## 2012-06-14 MED ORDER — AMLODIPINE BESYLATE 10 MG PO TABS
10.0000 mg | ORAL_TABLET | Freq: Every day | ORAL | Status: DC
  Administered 2012-06-14: 10 mg via ORAL
  Filled 2012-06-14: qty 1

## 2012-06-14 MED ORDER — POTASSIUM CHLORIDE CRYS ER 20 MEQ PO TBCR
40.0000 meq | EXTENDED_RELEASE_TABLET | Freq: Two times a day (BID) | ORAL | Status: DC
  Administered 2012-06-14: 40 meq via ORAL
  Filled 2012-06-14: qty 2

## 2012-06-14 MED ORDER — POTASSIUM CHLORIDE ER 20 MEQ PO TBCR: 20.0000 meq | EXTENDED_RELEASE_TABLET | Freq: Every day | ORAL | Status: DC

## 2012-06-14 MED ORDER — AMLODIPINE BESYLATE 5 MG PO TABS: 10.0000 mg | ORAL_TABLET | Freq: Every day | ORAL | Status: DC

## 2012-06-14 MED ORDER — POTASSIUM CHLORIDE CRYS ER 20 MEQ PO TBCR: 20.0000 meq | EXTENDED_RELEASE_TABLET | Freq: Every day | ORAL | Status: DC

## 2012-06-14 MED ORDER — POTASSIUM CHLORIDE CRYS ER 20 MEQ PO TBCR: 40.0000 meq | EXTENDED_RELEASE_TABLET | Freq: Once | ORAL | Status: DC

## 2012-06-14 NOTE — Progress Notes (Signed)
EPW and CTS removed, pt tolerated well. No signs of bleeding or ectopy. Pt resting in bed. Will continue to monitor closely.

## 2012-06-14 NOTE — Progress Notes (Signed)
6 Days Post-Op Procedure(s) (LRB): CORONARY ARTERY BYPASS GRAFTING (CABG) (N/A) TRANSESOPHAGEAL ECHOCARDIOGRAM (TEE) (N/A) Subjective:  Ms. Graf has no complaints this morning.  States she is doing well because she is going home today. +BM  Objective: Vital signs in last 24 hours: Temp:  [97.6 F (36.4 C)-98.5 F (36.9 C)] 97.6 F (36.4 C) (03/05 0500) Pulse Rate:  [75-84] 81 (03/05 0500) Cardiac Rhythm:  [-] Normal sinus rhythm (03/04 1930) Resp:  [18] 18 (03/05 0500) BP: (154-178)/(52-65) 162/62 mmHg (03/05 0724) SpO2:  [94 %-96 %] 96 % (03/05 0500) Weight:  [114 lb 6.7 oz (51.9 kg)] 114 lb 6.7 oz (51.9 kg) (03/05 0500)  Intake/Output from previous day: 03/04 0701 - 03/05 0700 In: 480 [P.O.:480] Out: 1500 [Urine:1500]  General appearance: alert, cooperative and no distress Neurologic: intact Heart: regular rate and rhythm Lungs: diminished breath sounds Left Base Abdomen: soft, non-tender; bowel sounds normal; no masses,  no organomegaly Extremities: edema trace Wound: clean and dry  Lab Results:  Recent Labs  06/12/12 0742 06/13/12 0525  WBC 9.6 7.1  HGB 11.3* 10.9*  HCT 33.5* 32.1*  PLT 150 164   BMET:  Recent Labs  06/13/12 0525 06/14/12 0600  NA 136 138  K 3.3* 2.9*  CL 103 102  CO2 21 22  GLUCOSE 91 89  BUN 31* 26*  CREATININE 1.59* 1.39*  CALCIUM 7.6* 8.2*    PT/INR: No results found for this basename: LABPROT, INR,  in the last 72 hours ABG    Component Value Date/Time   PHART 7.299* 06/09/2012 0358   HCO3 22.6 06/09/2012 0358   TCO2 23 06/09/2012 1642   ACIDBASEDEF 4.0* 06/09/2012 0358   O2SAT 99.0 06/09/2012 0358   CBG (last 3)   Recent Labs  06/11/12 1151  GLUCAP 106*    Assessment/Plan: S/P Procedure(s) (LRB): CORONARY ARTERY BYPASS GRAFTING (CABG) (N/A) TRANSESOPHAGEAL ECHOCARDIOGRAM (TEE) (N/A)  1. CV- NSR on Amiodarone, Lopressor, Norvasc 2. Pulm- no acute issues, off ocygen 3. Renal-creatinine at baseline, will continue  to hold ACEI 4. Hypokalemia- will give supplementation  5. Dispo- patient is stable doing well, will plan to d/c home today    LOS: 8 days    Raford Pitcher, ERIN 06/14/2012

## 2012-06-14 NOTE — Progress Notes (Signed)
PA aware of pt's BP. Pt asymptomatic. Higher dose of Norvasc ordered. Will continue to monitor closely.

## 2012-07-03 ENCOUNTER — Other Ambulatory Visit: Payer: Self-pay | Admitting: *Deleted

## 2012-07-03 DIAGNOSIS — I2510 Atherosclerotic heart disease of native coronary artery without angina pectoris: Secondary | ICD-10-CM

## 2012-07-04 ENCOUNTER — Ambulatory Visit
Admission: RE | Admit: 2012-07-04 | Discharge: 2012-07-04 | Disposition: A | Discharge status: Home / Self Care | Payer: Medicare Other | Source: Ambulatory Visit | Attending: Thoracic Surgery (Cardiothoracic Vascular Surgery) | Admitting: Thoracic Surgery (Cardiothoracic Vascular Surgery)

## 2012-07-04 ENCOUNTER — Ambulatory Visit (INDEPENDENT_AMBULATORY_CARE_PROVIDER_SITE_OTHER): Payer: Self-pay | Admitting: Thoracic Surgery (Cardiothoracic Vascular Surgery)

## 2012-07-04 ENCOUNTER — Encounter: Payer: Self-pay | Admitting: Thoracic Surgery (Cardiothoracic Vascular Surgery)

## 2012-07-04 VITALS — BP 109/67 | HR 79 | Resp 16 | Ht 62.0 in | Wt 108.0 lb

## 2012-07-04 DIAGNOSIS — Z951 Presence of aortocoronary bypass graft: Secondary | ICD-10-CM

## 2012-07-04 DIAGNOSIS — I2510 Atherosclerotic heart disease of native coronary artery without angina pectoris: Secondary | ICD-10-CM

## 2012-07-04 MED ORDER — PREDNISONE (PAK) 10 MG PO TABS: ORAL_TABLET | ORAL | Status: DC

## 2012-07-04 NOTE — Patient Instructions (Signed)
Decrease amiodarone to one tablet daily  Take prednisone as directed  Do not lift over 10 pounds or drive a car for another 2 weeks  Return in 2 weeks

## 2012-07-04 NOTE — Progress Notes (Signed)
HPI:  [name] is an 79 year old woman who underwent coronary bypass grafting x3 on February 27. Her postoperative course was notable for atrial fibrillation which converted to sinus rhythm with amiodarone her course was otherwise uncomplicated and she was discharged home on day 5.  She says that she's having minimal discomfort. She does get short of breath with exertion. She does not have a problem lying flat on her back. She has not noted any swelling in her legs. She saw Dr. Darrold Junker last week and he continued her Lasix and potassium. Her son believes her amiodarone was decreased to 200 mg daily.  Past Medical History  Diagnosis Date  . Allergic rhinitis   . Anemia   . Coronary artery disease   . Hyperlipidemia   . Hypertension   . Osteopenia   . GERD (gastroesophageal reflux disease)   . Anginal pain   . Heart murmur   . Shortness of breath     at times"  . Arthritis     hands    osteo      Current Outpatient Prescriptions  Medication Sig Dispense Refill  . acetaminophen (TYLENOL) 500 MG tablet Take 2 tablets (1,000 mg total) by mouth every 6 (six) hours as needed for pain.  30 tablet    . ALPRAZolam (XANAX) 0.25 MG tablet Take 0.125 mg by mouth daily as needed for anxiety.      Marland Kitchen amiodarone (PACERONE) 200 MG tablet Take 200 mg by mouth daily.      Marland Kitchen amLODipine (NORVASC) 5 MG tablet Take 2 tablets (10 mg total) by mouth daily.  30 tablet  1  . aspirin 325 MG tablet Take 325 mg by mouth daily.        Marland Kitchen docusate sodium 100 MG CAPS Take 200 mg by mouth daily.  10 capsule    . furosemide (LASIX) 40 MG tablet Take 1 tablet (40 mg total) by mouth daily. For 5 Days  5 tablet  0  . metoprolol tartrate (LOPRESSOR) 25 MG tablet Take 25 mg by mouth 2 (two) times daily.      . Potassium Chloride ER 20 MEQ TBCR Take 20 mEq by mouth daily. For 5 Days  5 tablet  0  . pravastatin (PRAVACHOL) 80 MG tablet Take 40 mg by mouth at bedtime.       Marland Kitchen lisinopril (PRINIVIL,ZESTRIL) 10 MG tablet        . potassium chloride SA (K-DUR,KLOR-CON) 20 MEQ tablet       . predniSONE (STERAPRED UNI-PAK) 10 MG tablet Take 5 tablets by mouth on day 1, 4 on day 2, 3 on day 3, 2 on day 4 and 1 on day 5  15 tablet  0   No current facility-administered medications for this visit.    Physical Exam BP 109/67  Pulse 79  Resp 16  Ht 5\' 2"  (1.575 m)  Wt 108 lb (48.988 kg)  BMI 19.75 kg/m2  SpO58 74% 79 year old woman in no acute distress Neurologic alert and oriented x3 no focal deficits Lungs decreased breath sounds with dullness to percussion left base Cardiac regular rate and rhythm normal S1 and S2 no rub Sternum stable, incision clean dry and intact The leg incision healing well, no peripheral edema  Diagnostic Tests: Chest x-ray 07/04/2012  Shows a left pleural effusion and some associated atelectasis  Impression: 79 year old woman status post coronary bypass grafting x3 she now is about a month out from surgery and doing well overall. She does have a  moderate left pleural effusion. I then do a thoracentesis him and try steroid taper to see if we can get rid of the fluid noninvasively. She remains on Lasix. We will give her prednisone 50 mg tapering to 10 mg over 5 days and plan to see her back a repeat chest x-ray in 2 weeks.  From a recovery standpoint I think she's doing quite well. She's having minimal discomfort. Her exercise tolerance is somewhat limited due to the pleural effusion. She asked about starting cardiac rehabilitation advice should wait a couple of weeks before doing that so that she can derive maximal benefit. I also asked her away 2 weeks before beginning to drive or lifting anything over 10 pounds.   Plan: Change amiodarone once daily  Prednisone taper  Return in 2 weeks with repeat PA and lateral chest x-ray to see if thoracentesis is necessary.

## 2012-07-13 ENCOUNTER — Other Ambulatory Visit: Payer: Self-pay | Admitting: *Deleted

## 2012-07-13 DIAGNOSIS — I2510 Atherosclerotic heart disease of native coronary artery without angina pectoris: Secondary | ICD-10-CM

## 2012-07-18 ENCOUNTER — Other Ambulatory Visit: Payer: Self-pay | Admitting: *Deleted

## 2012-07-18 ENCOUNTER — Ambulatory Visit
Admission: RE | Admit: 2012-07-18 | Discharge: 2012-07-18 | Disposition: A | Discharge status: Home / Self Care | Payer: Medicare Other | Source: Ambulatory Visit | Attending: Thoracic Surgery (Cardiothoracic Vascular Surgery) | Admitting: Thoracic Surgery (Cardiothoracic Vascular Surgery)

## 2012-07-18 ENCOUNTER — Encounter: Payer: Self-pay | Admitting: Thoracic Surgery (Cardiothoracic Vascular Surgery)

## 2012-07-18 ENCOUNTER — Other Ambulatory Visit: Payer: Self-pay | Admitting: Thoracic Surgery (Cardiothoracic Vascular Surgery)

## 2012-07-18 ENCOUNTER — Ambulatory Visit (INDEPENDENT_AMBULATORY_CARE_PROVIDER_SITE_OTHER): Payer: Self-pay | Admitting: Thoracic Surgery (Cardiothoracic Vascular Surgery)

## 2012-07-18 VITALS — BP 109/65 | HR 74 | Resp 16 | Ht 62.0 in | Wt 108.0 lb

## 2012-07-18 DIAGNOSIS — J90 Pleural effusion, not elsewhere classified: Secondary | ICD-10-CM

## 2012-07-18 DIAGNOSIS — Z951 Presence of aortocoronary bypass graft: Secondary | ICD-10-CM

## 2012-07-18 DIAGNOSIS — I2510 Atherosclerotic heart disease of native coronary artery without angina pectoris: Secondary | ICD-10-CM

## 2012-07-18 NOTE — Progress Notes (Signed)
HPI:  Mrs. Sansone is an 79 year old woman who underwent coronary bypass grafting x3 on February 27. Her postoperative course was notable for atrial fibrillation which converted to sinus rhythm with amiodarone her course was otherwise uncomplicated and she was discharged home on day 5.  She was seen in the office on March 25. At that time she was doing well overall but did have a left pleural effusion. She was given a prednisone taper and comes back today with a repeat chest x-ray to followup the effusion. She does say that she feels that her breathing is improved in the interim. She is having minimal incisional discomfort. She is anxious to begin driving and resuming her normal activities.  Past Medical History  Diagnosis Date  . Allergic rhinitis   . Anemia   . Coronary artery disease   . Hyperlipidemia   . Hypertension   . Osteopenia   . GERD (gastroesophageal reflux disease)   . Anginal pain   . Heart murmur   . Shortness of breath     at times"  . Arthritis     hands    osteo       Current Outpatient Prescriptions  Medication Sig Dispense Refill  . acetaminophen (TYLENOL) 500 MG tablet Take 2 tablets (1,000 mg total) by mouth every 6 (six) hours as needed for pain.  30 tablet    . ALPRAZolam (XANAX) 0.25 MG tablet Take 0.125 mg by mouth daily as needed for anxiety.      Marland Kitchen amiodarone (PACERONE) 200 MG tablet Take 200 mg by mouth daily.      Marland Kitchen aspirin 325 MG tablet Take 325 mg by mouth daily.        Marland Kitchen docusate sodium 100 MG CAPS Take 200 mg by mouth daily.  10 capsule    . furosemide (LASIX) 40 MG tablet Take 1 tablet (40 mg total) by mouth daily. For 5 Days  5 tablet  0  . lisinopril (PRINIVIL,ZESTRIL) 10 MG tablet       . metoprolol tartrate (LOPRESSOR) 25 MG tablet Take 25 mg by mouth 2 (two) times daily.      . Potassium Chloride ER 20 MEQ TBCR Take 20 mEq by mouth daily. For 5 Days  5 tablet  0  . potassium chloride SA (K-DUR,KLOR-CON) 20 MEQ tablet       . pravastatin  (PRAVACHOL) 80 MG tablet Take 40 mg by mouth at bedtime.        No current facility-administered medications for this visit.    Physical Exam BP 109/65  Pulse 74  Resp 16  Ht 5\' 2"  (1.575 m)  Wt 108 lb (48.988 kg)  BMI 19.75 kg/m2  SpO66 48% 79 year old woman in no acute distress Cardiac regular rate and rhythm normal S1 and S2 Lungs diminished breath sounds with Allis to percussion at the left base Incisions healing well No peripheral edema  Diagnostic Tests: Chest x-ray 07/18/12 CHEST - 2 VIEW  Comparison: 07/04/2012  Findings: Two views of the chest again demonstrate postoperative  changes consistent with a CABG procedure. Stable densities at the  left lung base are suggestive for atelectasis and pleural fluid.  There has been minimal change since the previous examination.  Right lung remains clear. No evidence for a pneumothorax. Heart  size is stable.  IMPRESSION:  There are persistent left basilar densities that are suggestive for  pleural fluid and atelectasis. Minimal change from the previous  examination.  Original Report Authenticated By: Richarda Overlie, M.D.  Impression: Mrs. Wetsel is an 79 year old woman status post coronary bypass grafting about 5 weeks ago. She has a left pleural effusion. This has improved to some degree with steroids but is still larger than I would be comfortable with leaving. I recommended that we do a thoracentesis in the office today to drain the fluid to try to get the lung to completely reexpand. I discussed with her the indications, risks, this, and alternatives. She understands the risk include but not limited to bleeding and pneumothorax as well as a vagal reaction.  She accepts the risk and agrees to proceed with thoracentesis.   Procedure: Using sterile technique and 1% lidocaine local anesthetic (5 ML) left thoracentesis was performed. 750 mL of serosanguineous fluid was evacuated. Patient tolerated procedure well but did have some  coughing and become mildly lightheaded at the end of the procedure.  We will plan to send her for a chest x-ray postthoracentesis.  She may begin driving, appropriate precautions were discussed. She was encouraged to start cardiac rehabilitation. She will gradually resume her other activities.   Plan: PA and lateral chest x-ray now and in 3 weeks for followup we'll pleural effusion the thoracentesis.

## 2012-07-31 ENCOUNTER — Encounter: Payer: Self-pay | Admitting: Cardiology

## 2012-08-03 ENCOUNTER — Ambulatory Visit
Admission: RE | Admit: 2012-08-03 | Discharge: 2012-08-03 | Disposition: A | Discharge status: Home / Self Care | Payer: Medicare Other | Source: Ambulatory Visit | Attending: Thoracic Surgery (Cardiothoracic Vascular Surgery) | Admitting: Thoracic Surgery (Cardiothoracic Vascular Surgery)

## 2012-08-03 ENCOUNTER — Other Ambulatory Visit: Payer: Self-pay | Admitting: Thoracic Surgery (Cardiothoracic Vascular Surgery)

## 2012-08-03 ENCOUNTER — Encounter: Payer: Self-pay | Admitting: Thoracic Surgery (Cardiothoracic Vascular Surgery)

## 2012-08-03 ENCOUNTER — Ambulatory Visit (INDEPENDENT_AMBULATORY_CARE_PROVIDER_SITE_OTHER): Payer: Self-pay | Admitting: Thoracic Surgery (Cardiothoracic Vascular Surgery)

## 2012-08-03 ENCOUNTER — Other Ambulatory Visit: Payer: Self-pay

## 2012-08-03 ENCOUNTER — Other Ambulatory Visit: Payer: Self-pay | Admitting: *Deleted

## 2012-08-03 VITALS — BP 133/72 | HR 110 | Resp 20 | Ht 62.0 in | Wt 108.0 lb

## 2012-08-03 DIAGNOSIS — I2510 Atherosclerotic heart disease of native coronary artery without angina pectoris: Secondary | ICD-10-CM

## 2012-08-03 DIAGNOSIS — J90 Pleural effusion, not elsewhere classified: Secondary | ICD-10-CM

## 2012-08-03 DIAGNOSIS — R0602 Shortness of breath: Secondary | ICD-10-CM

## 2012-08-03 DIAGNOSIS — Z951 Presence of aortocoronary bypass graft: Secondary | ICD-10-CM

## 2012-08-03 MED ORDER — PREDNISONE (PAK) 10 MG PO TABS: ORAL_TABLET | ORAL | Status: DC

## 2012-08-03 NOTE — Progress Notes (Signed)
HPI:  [name] is an 79 year old woman who underwent coronary bypass grafting x3 on February 27. Her postoperative course was notable for atrial fibrillation which converted to sinus rhythm with amiodarone her course was otherwise uncomplicated and she was discharged home on day 5.  She was seen in the office on March 25. At that time she was doing well overall but did have a left pleural effusion. She was given a prednisone taper.  She was next seen on 06/17/12 and her effusion was still present. A left thoracentesis drained 750 ml of serosanguinous fluid. She was treated with steroids and scheduled to return to see me next week. Initially after drainage she had improved breathing, but last week began experiencing increasing SOB. She saw Dr. Randa Lynn yesterday and a CXR showed a recurrent left effusion. Her lopressor was stopped due to a low heart rate.  She is not having any significant incisional pain.  Past Medical History  Diagnosis Date  . Allergic rhinitis   . Anemia   . Coronary artery disease   . Hyperlipidemia   . Hypertension   . Osteopenia   . GERD (gastroesophageal reflux disease)   . Anginal pain   . Heart murmur   . Shortness of breath     at times"  . Arthritis     hands    osteo     Current Outpatient Prescriptions  Medication Sig Dispense Refill  . acetaminophen (TYLENOL) 500 MG tablet Take 2 tablets (1,000 mg total) by mouth every 6 (six) hours as needed for pain.  30 tablet    . ALPRAZolam (XANAX) 0.25 MG tablet Take 0.125 mg by mouth daily as needed for anxiety.      Marland Kitchen amiodarone (PACERONE) 200 MG tablet Take 200 mg by mouth daily.      Marland Kitchen amLODipine (NORVASC) 10 MG tablet Take 10 mg by mouth daily.       Marland Kitchen aspirin 325 MG tablet Take 325 mg by mouth daily.        Marland Kitchen docusate sodium 100 MG CAPS Take 200 mg by mouth daily.  10 capsule    . levothyroxine (SYNTHROID, LEVOTHROID) 50 MCG tablet Take 50 mcg by mouth daily before breakfast.       . lisinopril  (PRINIVIL,ZESTRIL) 10 MG tablet Take 10 mg by mouth daily.       . Potassium Chloride ER 20 MEQ TBCR Take 20 mEq by mouth daily. For 5 Days  5 tablet  0  . pravastatin (PRAVACHOL) 80 MG tablet Take 40 mg by mouth at bedtime.        No current facility-administered medications for this visit.    Physical Exam BP 133/72  Pulse 110  Resp 20  Ht 5\' 2"  (1.575 m)  Wt 108 lb (48.988 kg)  BMI 19.75 kg/m2  SpO2 98% 79 yo female in NAD Incision well healed Lungs with decreased BS and dullness to percussion in left base. No peripheral edema  Diagnostic Tests: CXR 08/03/12 Recurrent left pleural effusion  Impression: 79 yo woman now about 2 months out from CABG. She has a recurrent left pleural effusion. I recommended that we proceed with a repeat left thoracentesis. She understands and accepts the risks and agrees to proceed.   PROCEDURE Using sterile technique and 1% lidocaine(5 ml) local anesthetic left thoracentesis was performed. 1 liter of serous fluid was evacuated. Tolerated well with some coughing.  Plan: Will send for CXR post thoracentesis  Will give another steroid taper  Return  on May 5 with repeat CXR

## 2012-08-08 ENCOUNTER — Ambulatory Visit: Payer: Medicare Other | Admitting: Thoracic Surgery (Cardiothoracic Vascular Surgery)

## 2012-08-10 ENCOUNTER — Other Ambulatory Visit: Payer: Self-pay | Admitting: *Deleted

## 2012-08-10 ENCOUNTER — Encounter: Payer: Self-pay | Admitting: Cardiology

## 2012-08-10 DIAGNOSIS — I2510 Atherosclerotic heart disease of native coronary artery without angina pectoris: Secondary | ICD-10-CM

## 2012-08-15 ENCOUNTER — Ambulatory Visit
Admission: RE | Admit: 2012-08-15 | Discharge: 2012-08-15 | Disposition: A | Discharge status: Home / Self Care | Payer: Medicare Other | Source: Ambulatory Visit | Attending: Thoracic Surgery (Cardiothoracic Vascular Surgery) | Admitting: Thoracic Surgery (Cardiothoracic Vascular Surgery)

## 2012-08-15 ENCOUNTER — Encounter: Payer: Self-pay | Admitting: Thoracic Surgery (Cardiothoracic Vascular Surgery)

## 2012-08-15 ENCOUNTER — Other Ambulatory Visit: Payer: Self-pay | Admitting: *Deleted

## 2012-08-15 ENCOUNTER — Ambulatory Visit (INDEPENDENT_AMBULATORY_CARE_PROVIDER_SITE_OTHER): Payer: Medicare Other | Admitting: Thoracic Surgery (Cardiothoracic Vascular Surgery)

## 2012-08-15 VITALS — BP 130/80 | HR 48 | Resp 16 | Ht 62.0 in | Wt 105.0 lb

## 2012-08-15 DIAGNOSIS — J90 Pleural effusion, not elsewhere classified: Secondary | ICD-10-CM

## 2012-08-15 DIAGNOSIS — I2510 Atherosclerotic heart disease of native coronary artery without angina pectoris: Secondary | ICD-10-CM

## 2012-08-15 DIAGNOSIS — Z951 Presence of aortocoronary bypass graft: Secondary | ICD-10-CM

## 2012-08-15 NOTE — Progress Notes (Signed)
HPI:  [name] returns today for followup of her left pleural effusion.  She is an 79 year old woman who underwent coronary bypass grafting x3 on February 27. Her postoperative course was notable for atrial fibrillation which converted to sinus rhythm with amiodarone. Her course was otherwise uncomplicated and she was discharged home on day 5.  She was seen in the office on March 25. At that time she was doing well overall but did have a left pleural effusion. She was given a prednisone taper but the effusion persisted. She underwent thoracentesis on 07/18/2012. 750 mL's of fluid was withdrawn. She was given another steroid taper. About 3 weeks later she developed shortness of breath and a chest x-ray showed recurrence of her left pleural effusion. She had another thoracentesis done on for 08/03/12, this time a liter was drained. Again she was treated with a steroid taper. Her breathing improved significantly for several days. However over the past week she started getting progressively more short of breath with exertion.  Past Medical History  Diagnosis Date  . Allergic rhinitis   . Anemia   . Coronary artery disease   . Hyperlipidemia   . Hypertension   . Osteopenia   . GERD (gastroesophageal reflux disease)   . Anginal pain   . Heart murmur   . Shortness of breath     at times"  . Arthritis     hands    osteo      Current Outpatient Prescriptions  Medication Sig Dispense Refill  . acetaminophen (TYLENOL) 500 MG tablet Take 2 tablets (1,000 mg total) by mouth every 6 (six) hours as needed for pain.  30 tablet    . ALPRAZolam (XANAX) 0.25 MG tablet Take 0.125 mg by mouth daily as needed for anxiety.      Marland Kitchen amLODipine (NORVASC) 10 MG tablet Take 10 mg by mouth daily.       Marland Kitchen aspirin 325 MG tablet Take 325 mg by mouth daily.        Marland Kitchen docusate sodium 100 MG CAPS Take 200 mg by mouth daily.  10 capsule    . levothyroxine (SYNTHROID, LEVOTHROID) 50 MCG tablet Take 50 mcg by mouth  daily before breakfast.       . lisinopril (PRINIVIL,ZESTRIL) 10 MG tablet Take 10 mg by mouth daily.       . metoprolol (LOPRESSOR) 50 MG tablet Take 25 mg by mouth 2 (two) times daily.      . Potassium Chloride ER 20 MEQ TBCR Take 20 mEq by mouth daily. For 5 Days  5 tablet  0  . pravastatin (PRAVACHOL) 80 MG tablet Take 40 mg by mouth at bedtime.       Marland Kitchen amiodarone (PACERONE) 200 MG tablet Take 200 mg by mouth daily.      . furosemide (LASIX) 40 MG tablet        No current facility-administered medications for this visit.    Physical Exam BP 130/80  Pulse 48  Resp 16  Ht 5\' 2"  (1.575 m)  Wt 105 lb (47.628 kg)  BMI 19.2 kg/m2  SpO43 5% 79 year old woman in no acute distress Cardiac bradycardic and regular, normal S1 and S2 Lungs diminished breath sounds left base No peripheral edema  Diagnostic Tests: Chest x-ray 08/15/2012 shows partial reaccumulation of left pleural effusion  Impression: 79 year old woman status post recent coronary bypass grafting who has a recurrent left pleural effusion. She's had thoracentesis on 2 occasions most recently about 12 days ago. She has  already had some significant reaccumulation in the interim despite treatment with steroids and Lasix.  I long discussion with the patient and her son regarding her options. One option would be to repeat thoracentesis as needed. A second option would be a Pleurx catheter to drain the fluid. The third option would be a left VATS for drainage of the effusion and placement of a chest tube. Any of these are reasonable options. However I think in her case the best option likely is a Pleurx catheter placement. Ectomy done in the OR under local anesthesia and is generally very effective in keeping the fluid drained to allow pleural symphysis. With an effusion of this size I suspect she would need a catheter for about 4-6 weeks at the most. There is some risk associated with placement including bleeding and infection. There  also is a risk of catheter malposition. Will return there is risk of catheter malfunction due to clogging or infection which could necessitate catheter removal. However the risks are very small compared to the benefit of keeping the pleural fluid evacuated until the inflammatory process ties down in the lungs and scar to the chest wall to prevent recurrence of the effusion and loss of pulmonary function.  After consideration [name] would like to proceed with Pleurx catheter placement.  Plan:  Left Pleurx catheter placement under local anesthesia with IV sedation in the operating room on Thursday, May 8. We will plan to keep her overnight to allow for drainage the following morning and to arrange for home health nurse.

## 2012-08-16 ENCOUNTER — Encounter (HOSPITAL_COMMUNITY): Payer: Self-pay | Admitting: Pharmacist

## 2012-08-16 ENCOUNTER — Encounter (HOSPITAL_COMMUNITY): Payer: Self-pay | Admitting: *Deleted

## 2012-08-16 MED ORDER — DEXTROSE 5 % IV SOLN
1.5000 g | INTRAVENOUS | Status: AC
  Administered 2012-08-17: 1.5 g via INTRAVENOUS
  Filled 2012-08-16: qty 1.5

## 2012-08-17 ENCOUNTER — Encounter (HOSPITAL_COMMUNITY): Payer: Self-pay | Admitting: *Deleted

## 2012-08-17 ENCOUNTER — Encounter (HOSPITAL_COMMUNITY): Payer: Self-pay | Admitting: Certified Registered Nurse Anesthetist

## 2012-08-17 ENCOUNTER — Ambulatory Visit (HOSPITAL_COMMUNITY): Payer: Medicare Other

## 2012-08-17 ENCOUNTER — Encounter (HOSPITAL_COMMUNITY)
Admission: RE | Disposition: A | Discharge status: Home / Self Care | Payer: Self-pay | Source: Ambulatory Visit | Attending: Thoracic Surgery (Cardiothoracic Vascular Surgery)

## 2012-08-17 ENCOUNTER — Observation Stay (HOSPITAL_COMMUNITY)
Admission: RE | Admit: 2012-08-17 | Discharge: 2012-08-18 | Disposition: A | Discharge status: Home / Self Care | Payer: Medicare Other | Source: Ambulatory Visit | Attending: Thoracic Surgery (Cardiothoracic Vascular Surgery) | Admitting: Thoracic Surgery (Cardiothoracic Vascular Surgery)

## 2012-08-17 ENCOUNTER — Ambulatory Visit (HOSPITAL_COMMUNITY): Payer: Medicare Other | Admitting: Certified Registered Nurse Anesthetist

## 2012-08-17 DIAGNOSIS — I10 Essential (primary) hypertension: Secondary | ICD-10-CM | POA: Insufficient documentation

## 2012-08-17 DIAGNOSIS — I2510 Atherosclerotic heart disease of native coronary artery without angina pectoris: Secondary | ICD-10-CM | POA: Insufficient documentation

## 2012-08-17 DIAGNOSIS — I4891 Unspecified atrial fibrillation: Secondary | ICD-10-CM | POA: Insufficient documentation

## 2012-08-17 DIAGNOSIS — J90 Pleural effusion, not elsewhere classified: Secondary | ICD-10-CM

## 2012-08-17 DIAGNOSIS — K219 Gastro-esophageal reflux disease without esophagitis: Secondary | ICD-10-CM | POA: Insufficient documentation

## 2012-08-17 DIAGNOSIS — Z7901 Long term (current) use of anticoagulants: Secondary | ICD-10-CM | POA: Insufficient documentation

## 2012-08-17 DIAGNOSIS — E785 Hyperlipidemia, unspecified: Secondary | ICD-10-CM | POA: Insufficient documentation

## 2012-08-17 HISTORY — PX: CHEST TUBE INSERTION: SHX231

## 2012-08-17 HISTORY — DX: Primary osteoarthritis, unspecified hand: M19.049

## 2012-08-17 HISTORY — PX: INSERTION / PLACEMENT PLEURAL CATHETER: SUR721

## 2012-08-17 HISTORY — DX: Pleural effusion, not elsewhere classified: J90

## 2012-08-17 LAB — SURGICAL PCR SCREEN: Staphylococcus aureus: NEGATIVE

## 2012-08-17 LAB — CBC
Hemoglobin: 10.8 g/dL — ABNORMAL LOW (ref 12.0–15.0)
MCHC: 34.3 g/dL (ref 30.0–36.0)
RDW: 17 % — ABNORMAL HIGH (ref 11.5–15.5)
WBC: 8.3 10*3/uL (ref 4.0–10.5)

## 2012-08-17 LAB — COMPREHENSIVE METABOLIC PANEL
ALT: 24 U/L (ref 0–35)
Albumin: 2.8 g/dL — ABNORMAL LOW (ref 3.5–5.2)
Alkaline Phosphatase: 91 U/L (ref 39–117)
Potassium: 4.5 mEq/L (ref 3.5–5.1)
Sodium: 135 mEq/L (ref 135–145)
Total Protein: 6.7 g/dL (ref 6.0–8.3)

## 2012-08-17 LAB — APTT: aPTT: 35 seconds (ref 24–37)

## 2012-08-17 SURGERY — INSERTION, PLEURAL DRAINAGE CATHETER
Anesthesia: Monitor Anesthesia Care | Site: Chest | Laterality: Left

## 2012-08-17 MED ORDER — METOPROLOL TARTRATE 25 MG PO TABS
25.0000 mg | ORAL_TABLET | Freq: Once | ORAL | Status: DC
  Filled 2012-08-17: qty 1

## 2012-08-17 MED ORDER — AMIODARONE HCL 200 MG PO TABS
200.0000 mg | ORAL_TABLET | Freq: Every evening | ORAL | Status: DC
  Filled 2012-08-17: qty 1

## 2012-08-17 MED ORDER — GLYCOPYRROLATE 0.2 MG/ML IJ SOLN
INTRAMUSCULAR | Status: AC
  Filled 2012-08-17: qty 2

## 2012-08-17 MED ORDER — SODIUM CHLORIDE 0.9 % IV SOLN
INTRAVENOUS | Status: DC
  Administered 2012-08-17: 35 mL/h via INTRAVENOUS

## 2012-08-17 MED ORDER — MUPIROCIN CALCIUM 2 % EX CREA
TOPICAL_CREAM | Freq: Two times a day (BID) | CUTANEOUS | Status: DC
  Administered 2012-08-17: 08:00:00 via TOPICAL
  Filled 2012-08-17: qty 15

## 2012-08-17 MED ORDER — ACETAMINOPHEN 500 MG PO TABS
500.0000 mg | ORAL_TABLET | Freq: Two times a day (BID) | ORAL | Status: DC
  Administered 2012-08-17 – 2012-08-18 (×2): 500 mg via ORAL
  Filled 2012-08-17 (×3): qty 1

## 2012-08-17 MED ORDER — METOPROLOL TARTRATE 12.5 MG HALF TABLET
ORAL_TABLET | ORAL | Status: AC
  Administered 2012-08-17: 25 mg via ORAL
  Filled 2012-08-17: qty 2

## 2012-08-17 MED ORDER — MIDAZOLAM HCL 5 MG/5ML IJ SOLN
INTRAMUSCULAR | Status: DC | PRN
  Administered 2012-08-17 (×2): 1 mg via INTRAVENOUS

## 2012-08-17 MED ORDER — HYPROMELLOSE (GONIOSCOPIC) 2.5 % OP SOLN
1.0000 [drp] | Freq: Two times a day (BID) | OPHTHALMIC | Status: DC | PRN
  Filled 2012-08-17: qty 15

## 2012-08-17 MED ORDER — TRAMADOL HCL 50 MG PO TABS: 100.0000 mg | ORAL_TABLET | Freq: Four times a day (QID) | ORAL | Status: DC | PRN

## 2012-08-17 MED ORDER — ALPRAZOLAM 0.25 MG PO TABS
0.2500 mg | ORAL_TABLET | Freq: Every day | ORAL | Status: DC
  Filled 2012-08-17: qty 1

## 2012-08-17 MED ORDER — 0.9 % SODIUM CHLORIDE (POUR BTL) OPTIME
TOPICAL | Status: DC | PRN
  Administered 2012-08-17: 1000 mL

## 2012-08-17 MED ORDER — LEVOTHYROXINE SODIUM 50 MCG PO TABS
50.0000 ug | ORAL_TABLET | Freq: Every day | ORAL | Status: DC
  Administered 2012-08-18: 50 ug via ORAL
  Filled 2012-08-17: qty 1

## 2012-08-17 MED ORDER — FERROUS SULFATE 325 (65 FE) MG PO TABS
325.0000 mg | ORAL_TABLET | Freq: Every day | ORAL | Status: DC
  Filled 2012-08-17: qty 1

## 2012-08-17 MED ORDER — SUFENTANIL CITRATE 50 MCG/ML IV SOLN
INTRAVENOUS | Status: DC | PRN
  Administered 2012-08-17: 10 ug via INTRAVENOUS

## 2012-08-17 MED ORDER — ASPIRIN EC 81 MG PO TBEC
81.0000 mg | DELAYED_RELEASE_TABLET | Freq: Every day | ORAL | Status: DC
  Filled 2012-08-17: qty 1

## 2012-08-17 MED ORDER — ONDANSETRON HCL 4 MG/2ML IJ SOLN: 4.0000 mg | Freq: Once | INTRAMUSCULAR | Status: DC | PRN

## 2012-08-17 MED ORDER — ALPRAZOLAM 0.25 MG PO TABS: 0.2500 mg | ORAL_TABLET | Freq: Every day | ORAL | Status: DC | PRN

## 2012-08-17 MED ORDER — MAGNESIUM HYDROXIDE 400 MG/5ML PO SUSP: 10.0000 mL | Freq: Every day | ORAL | Status: DC | PRN

## 2012-08-17 MED ORDER — TRAMADOL HCL 50 MG PO TABS: 50.0000 mg | ORAL_TABLET | Freq: Four times a day (QID) | ORAL | Status: DC | PRN

## 2012-08-17 MED ORDER — HYDROMORPHONE HCL PF 1 MG/ML IJ SOLN: 0.2500 mg | INTRAMUSCULAR | Status: DC | PRN

## 2012-08-17 MED ORDER — GLYCOPYRROLATE 0.2 MG/ML IJ SOLN
0.1000 mg | Freq: Once | INTRAMUSCULAR | Status: AC
  Administered 2012-08-17: 0.4 mg via INTRAVENOUS

## 2012-08-17 MED ORDER — METOPROLOL TARTRATE 25 MG PO TABS
25.0000 mg | ORAL_TABLET | Freq: Two times a day (BID) | ORAL | Status: DC
  Filled 2012-08-17 (×2): qty 1

## 2012-08-17 MED ORDER — POTASSIUM CHLORIDE CRYS ER 20 MEQ PO TBCR
20.0000 meq | EXTENDED_RELEASE_TABLET | Freq: Every day | ORAL | Status: DC
  Administered 2012-08-18: 20 meq via ORAL
  Filled 2012-08-17: qty 1

## 2012-08-17 MED ORDER — LIDOCAINE 1 % OPTIME INJ - NO CHARGE
INTRAMUSCULAR | Status: DC | PRN
  Administered 2012-08-17: 12 mL

## 2012-08-17 MED ORDER — MUPIROCIN 2 % EX OINT
TOPICAL_OINTMENT | CUTANEOUS | Status: AC
  Filled 2012-08-17: qty 22

## 2012-08-17 MED ORDER — POTASSIUM CHLORIDE ER 20 MEQ PO TBCR: 20.0000 meq | EXTENDED_RELEASE_TABLET | Freq: Every morning | ORAL | Status: DC

## 2012-08-17 MED ORDER — AMLODIPINE BESYLATE 10 MG PO TABS
10.0000 mg | ORAL_TABLET | Freq: Every morning | ORAL | Status: DC
  Administered 2012-08-18: 10 mg via ORAL
  Filled 2012-08-17: qty 1

## 2012-08-17 SURGICAL SUPPLY — 30 items
CANISTER SUCTION 2500CC (MISCELLANEOUS) ×2 IMPLANT
CLOTH BEACON ORANGE TIMEOUT ST (SAFETY) ×2 IMPLANT
COVER SURGICAL LIGHT HANDLE (MISCELLANEOUS) ×2 IMPLANT
DERMABOND ADVANCED (GAUZE/BANDAGES/DRESSINGS) ×1
DERMABOND ADVANCED .7 DNX12 (GAUZE/BANDAGES/DRESSINGS) ×1 IMPLANT
DRAPE C-ARM 42X72 X-RAY (DRAPES) ×2 IMPLANT
DRAPE LAPAROSCOPIC ABDOMINAL (DRAPES) ×2 IMPLANT
DRSG OPSITE 11X17.75 LRG (GAUZE/BANDAGES/DRESSINGS) ×2 IMPLANT
GLOVE BIOGEL PI IND STRL 6.5 (GLOVE) ×6 IMPLANT
GLOVE BIOGEL PI INDICATOR 6.5 (GLOVE) ×6
GLOVE ECLIPSE 6.0 STRL STRAW (GLOVE) ×2 IMPLANT
GLOVE EUDERMIC 7 POWDERFREE (GLOVE) ×2 IMPLANT
GOWN PREVENTION PLUS XLARGE (GOWN DISPOSABLE) ×6 IMPLANT
GOWN STRL NON-REIN LRG LVL3 (GOWN DISPOSABLE) ×2 IMPLANT
KIT BASIN OR (CUSTOM PROCEDURE TRAY) ×2 IMPLANT
KIT PLEURX DRAIN CATH 1000ML (MISCELLANEOUS) ×2 IMPLANT
KIT PLEURX DRAIN CATH 15.5FR (DRAIN) ×2 IMPLANT
KIT ROOM TURNOVER OR (KITS) ×2 IMPLANT
NEEDLE 22X1 1/2 (OR ONLY) (NEEDLE) ×2 IMPLANT
NS IRRIG 1000ML POUR BTL (IV SOLUTION) ×2 IMPLANT
PACK GENERAL/GYN (CUSTOM PROCEDURE TRAY) ×2 IMPLANT
PAD ARMBOARD 7.5X6 YLW CONV (MISCELLANEOUS) ×4 IMPLANT
SET DRAINAGE LINE (MISCELLANEOUS) IMPLANT
SPONGE GAUZE 4X4 12PLY (GAUZE/BANDAGES/DRESSINGS) ×2 IMPLANT
SUT ETHILON 3 0 FSL (SUTURE) ×2 IMPLANT
SUT VIC AB 3-0 X1 27 (SUTURE) ×4 IMPLANT
SYR CONTROL 10ML LL (SYRINGE) ×2 IMPLANT
TOWEL OR 17X24 6PK STRL BLUE (TOWEL DISPOSABLE) ×2 IMPLANT
TOWEL OR 17X26 10 PK STRL BLUE (TOWEL DISPOSABLE) ×2 IMPLANT
WATER STERILE IRR 1000ML POUR (IV SOLUTION) ×2 IMPLANT

## 2012-08-17 NOTE — Care Management Note (Signed)
  Page 1 of 1   08/17/2012     4:07:16 PM   CARE MANAGEMENT NOTE 08/17/2012  Patient:  [name], [name]   Account Number:  [id number]  Date Initiated:  08/17/2012  Documentation initiated by:  Ronny Flurry  Subjective/Objective Assessment:     Action/Plan:   Anticipated DC Date:  08/18/2012   Anticipated DC Plan:  HOME W HOME HEALTH SERVICES         Choice offered to / List presented to:  C-1 Patient        HH arranged  HH-1 RN      [facility] agency  [facility]   Status of service:   Medicare Important Message given?   (If response is "NO", the following Medicare IM given date fields will be blank) Date Medicare IM given:   Date Additional Medicare IM given:    Discharge Disposition:    Per UR Regulation:  Reviewed for med. necessity/level of care/duration of stay  If discussed at Long Length of Stay Meetings, dates discussed:    Comments:  08-17-12 Completed [employer] forms and faxed . Hard copy mailed to [employer]. Patient will take home box of PleurX in her room with her .  Ronny Flurry RN BSN [phone]

## 2012-08-17 NOTE — Anesthesia Postprocedure Evaluation (Signed)
  Anesthesia Post-op Note  Patient: [name]  Procedure(s) Performed: Procedure(s): INSERTION PLEURAL DRAINAGE CATHETER (Left)  Patient Location: PACU  Anesthesia Type:MAC  Level of Consciousness: awake, alert , oriented and patient cooperative  Airway and Oxygen Therapy: Patient Spontanous Breathing  Post-op Pain: mild  Post-op Assessment: Post-op Vital signs reviewed, Patient's Cardiovascular Status Stable, Respiratory Function Stable, Patent Airway, No signs of Nausea or vomiting and Pain level controlled  Post-op Vital Signs: stable  Complications: No apparent anesthesia complications

## 2012-08-17 NOTE — H&P (View-Only) (Signed)
HPI:  [name] returns today for followup of her left pleural effusion.  She is an 79-year-old woman who underwent coronary bypass grafting x3 on February 27. Her postoperative course was notable for atrial fibrillation which converted to sinus rhythm with amiodarone. Her course was otherwise uncomplicated and she was discharged home on day 5.  She was seen in the office on March 25. At that time she was doing well overall but did have a left pleural effusion. She was given a prednisone taper but the effusion persisted. She underwent thoracentesis on 07/18/2012. 750 mL's of fluid was withdrawn. She was given another steroid taper. About 3 weeks later she developed shortness of breath and a chest x-ray showed recurrence of her left pleural effusion. She had another thoracentesis done on for 08/03/12, this time a liter was drained. Again she was treated with a steroid taper. Her breathing improved significantly for several days. However over the past week she started getting progressively more short of breath with exertion.  Past Medical History  Diagnosis Date  . Allergic rhinitis   . Anemia   . Coronary artery disease   . Hyperlipidemia   . Hypertension   . Osteopenia   . GERD (gastroesophageal reflux disease)   . Anginal pain   . Heart murmur   . Shortness of breath     at times"  . Arthritis     hands    osteo      Current Outpatient Prescriptions  Medication Sig Dispense Refill  . acetaminophen (TYLENOL) 500 MG tablet Take 2 tablets (1,000 mg total) by mouth every 6 (six) hours as needed for pain.  30 tablet    . ALPRAZolam (XANAX) 0.25 MG tablet Take 0.125 mg by mouth daily as needed for anxiety.      . amLODipine (NORVASC) 10 MG tablet Take 10 mg by mouth daily.       . aspirin 325 MG tablet Take 325 mg by mouth daily.        . docusate sodium 100 MG CAPS Take 200 mg by mouth daily.  10 capsule    . levothyroxine (SYNTHROID, LEVOTHROID) 50 MCG tablet Take 50 mcg by mouth  daily before breakfast.       . lisinopril (PRINIVIL,ZESTRIL) 10 MG tablet Take 10 mg by mouth daily.       . metoprolol (LOPRESSOR) 50 MG tablet Take 25 mg by mouth 2 (two) times daily.      . Potassium Chloride ER 20 MEQ TBCR Take 20 mEq by mouth daily. For 5 Days  5 tablet  0  . pravastatin (PRAVACHOL) 80 MG tablet Take 40 mg by mouth at bedtime.       . amiodarone (PACERONE) 200 MG tablet Take 200 mg by mouth daily.      . furosemide (LASIX) 40 MG tablet        No current facility-administered medications for this visit.    Physical Exam BP 130/80  Pulse 48  Resp 16  Ht 5' 2" (1.575 m)  Wt 105 lb (47.628 kg)  BMI 19.2 kg/m2  SpO2 98% 79-year-old woman in no acute distress Cardiac bradycardic and regular, normal S1 and S2 Lungs diminished breath sounds left base No peripheral edema  Diagnostic Tests: Chest x-ray 08/15/2012 shows partial reaccumulation of left pleural effusion  Impression: 79-year-old woman status post recent coronary bypass grafting who has a recurrent left pleural effusion. She's had thoracentesis on 2 occasions most recently about 12 days ago. She has   already had some significant reaccumulation in the interim despite treatment with steroids and Lasix.  I long discussion with the patient and her son regarding her options. One option would be to repeat thoracentesis as needed. A second option would be a Pleurx catheter to drain the fluid. The third option would be a left VATS for drainage of the effusion and placement of a chest tube. Any of these are reasonable options. However I think in her case the best option likely is a Pleurx catheter placement. Ectomy done in the OR under local anesthesia and is generally very effective in keeping the fluid drained to allow pleural symphysis. With an effusion of this size I suspect she would need a catheter for about 4-6 weeks at the most. There is some risk associated with placement including bleeding and infection. There  also is a risk of catheter malposition. Will return there is risk of catheter malfunction due to clogging or infection which could necessitate catheter removal. However the risks are very small compared to the benefit of keeping the pleural fluid evacuated until the inflammatory process ties down in the lungs and scar to the chest wall to prevent recurrence of the effusion and loss of pulmonary function.  After consideration [name] would like to proceed with Pleurx catheter placement.  Plan:  Left Pleurx catheter placement under local anesthesia with IV sedation in the operating room on Thursday, May 8. We will plan to keep her overnight to allow for drainage the following morning and to arrange for home health nurse.  

## 2012-08-17 NOTE — Brief Op Note (Signed)
08/17/2012  12:27 PM  PATIENT:  [name]  79 y.o. female  PRE-OPERATIVE DIAGNOSIS:  RECURRENT (L) PLEURAL EFFUSION  POST-OPERATIVE DIAGNOSIS:  RECURRENT (L) PLEURAL EFFUSION  PROCEDURE:  Procedure(s): INSERTION PLEURAL DRAINAGE CATHETER (Left)  SURGEON:  Surgeon(s) and Role:    * Loreli Slot, MD - Primary  ANESTHESIA:   local and IV sedation  EBL:     BLOOD ADMINISTERED:none  DRAINS: Pleur-x catheter left pleural space   LOCAL MEDICATIONS USED:  LIDOCAINE  and Amount: 13 ml  COUNTS:  YES  PLAN OF CARE: Admit for overnight observation  PATIENT DISPOSITION:  PACU - hemodynamically stable.   Delay start of Pharmacological VTE agent (>24hrs) due to surgical blood loss or risk of bleeding: yes

## 2012-08-17 NOTE — Transfer of Care (Signed)
Immediate Anesthesia Transfer of Care Note  Patient: [name]  Procedure(s) Performed: Procedure(s): INSERTION PLEURAL DRAINAGE CATHETER (Left)  Patient Location: PACU  Anesthesia Type:MAC  Level of Consciousness: awake, alert  and oriented  Airway & Oxygen Therapy: Patient Spontanous Breathing and Patient connected to nasal cannula oxygen  Post-op Assessment: Report given to PACU RN, Post -op Vital signs reviewed and stable and Patient moving all extremities  Post vital signs: Reviewed and stable  Complications: No apparent anesthesia complications

## 2012-08-17 NOTE — Preoperative (Signed)
Beta Blockers   Reason not to administer Beta Blockers:Not Applicable, metoprolol taken 08/17/12.

## 2012-08-17 NOTE — Interval H&P Note (Signed)
History and Physical Interval Note:  08/17/2012 11:18 AM  [name]  has presented today for surgery, with the diagnosis of RECURRENT (L) PLEURAL EFFUSION  The various methods of treatment have been discussed with the patient and family. After consideration of risks, benefits and other options for treatment, the patient has consented to  Procedure(s): INSERTION PLEURAL DRAINAGE CATHETER (Left) as a surgical intervention .  The patient's history has been reviewed, patient examined, no change in status, stable for surgery.  I have reviewed the patient's chart and labs.  Questions were answered to the patient's satisfaction.     [name] C

## 2012-08-17 NOTE — Anesthesia Preprocedure Evaluation (Signed)
Anesthesia Evaluation  Patient identified by MRN, date of birth, ID band Patient awake    Reviewed: Allergy & Precautions, H&P , NPO status , Patient's Chart, lab work & pertinent test results  Airway Mallampati: I TM Distance: >3 FB Neck ROM: full    Dental   Pulmonary shortness of breath,          Cardiovascular hypertension, + angina + CAD, + CABG and + Peripheral Vascular Disease Rhythm:regular Rate:Normal     Neuro/Psych    GI/Hepatic GERD-  ,  Endo/Other    Renal/GU      Musculoskeletal   Abdominal   Peds  Hematology   Anesthesia Other Findings   Reproductive/Obstetrics                           Anesthesia Physical Anesthesia Plan  ASA: III  Anesthesia Plan: MAC   Post-op Pain Management:    Induction: Intravenous  Airway Management Planned: Simple Face Mask  Additional Equipment:   Intra-op Plan:   Post-operative Plan:   Informed Consent: I have reviewed the patients History and Physical, chart, labs and discussed the procedure including the risks, benefits and alternatives for the proposed anesthesia with the patient or authorized representative who has indicated his/her understanding and acceptance.     Plan Discussed with: CRNA, Anesthesiologist and Surgeon  Anesthesia Plan Comments:         Anesthesia Quick Evaluation

## 2012-08-18 ENCOUNTER — Observation Stay (HOSPITAL_COMMUNITY): Payer: Medicare Other

## 2012-08-18 ENCOUNTER — Encounter (HOSPITAL_COMMUNITY): Payer: Self-pay | Admitting: Thoracic Surgery (Cardiothoracic Vascular Surgery)

## 2012-08-18 MED ORDER — TRAMADOL HCL 50 MG PO TABS: 50.0000 mg | ORAL_TABLET | Freq: Four times a day (QID) | ORAL | Status: DC | PRN

## 2012-08-18 NOTE — Progress Notes (Signed)
Pt discharged to home

## 2012-08-18 NOTE — Op Note (Signed)
[name], [name] NO.:  [id number]  MEDICAL RECORD NO.:  [id number]  LOCATION:  [id number]                        FACILITY:  [facility]  PHYSICIAN:  Salvatore Decent. Dorris Fetch, M.D.DATE OF BIRTH:  1924-01-30  DATE OF PROCEDURE:  08/17/2012 DATE OF DISCHARGE:                              OPERATIVE REPORT   PREOPERATIVE DIAGNOSIS:  Recurrent left pleural effusion.  POSTOPERATIVE DIAGNOSIS:  Recurrent left pleural effusion.  PROCEDURE:  Left Pleurx catheter placement.  SURGEON:  Salvatore Decent. Dorris Fetch, M.D.  ASSISTANT:  None.  ANESTHESIA:  Local with intravenous sedation.  FINDINGS:  750-800 mL of serous fluid evacuated. Good position of the catheter by fluoro.  CLINICAL NOTE:  [name] is an 79 year old woman who had coronary bypass grafting in February.  Postoperative course was notable for atrial fibrillation, which converted to sinus rhythm with amiodarone and also development of a left pleural effusion.  She had undergone 2 prior thoracenteses with rapid reaccumulation of the fluid despite diuretics and steroids.  She was advised to have a left Pleurx catheter placement for management of the effusion.  The indications, risks, benefits, and alternatives were discussed in detail with the patient.  She understood and accepted the risks and agreed to proceed.  OPERATIVE NOTE:  [name] was brought to the operating room on Aug 17, 2012.  She was given intravenous antibiotics.  She was given intravenous sedation and monitored by the Anesthesia Service.  She was placed in a supine position with her left chest elevated 30 degrees.  Her left chest was prepped and draped in the usual sterile fashion.  1% Lidocaine was used to achieve the local anesthesia.  The site for the skin incision for catheter insertion as well as the catheter exit site were anesthetized. The tract for placement of the catheter into the pleural space was Anesthetized, as was the subcutaneous tissue  in the tract connecting the 2 incisions.  After ensuring adequate local effect, a small incision was made posterolaterally. Through this incision, a needle was inserted into the pleural space.  There was return of serous fluid.  The wire was advanced into the pleural space and positioning confirmed by fluoroscopy.  The catheter then was tunneled from the exit site to the primary incision with the cuff just below the exit site. Dilators were passed over the wire.  The peel-away sheath introducer was placed.  The inner cannula and wire were removed.  The catheter was placed through this sheath introducer, which was then removed.  The catheter was placed to suction and 750-800 mL of serous fluid was evacuated.  The patient did experience some coughing at the completion of the drainage.  The catheter was secured at its exit site with a 3-0 nylon suture.  The posterior incision was closed with a 3-0 Vicryl subcuticular suture.  Dermabond was applied. The catheter was dressed.  The patient was taken from the operating room to the postanesthetic care unit in good condition.     Salvatore Decent Dorris Fetch, M.D.     SCH/MEDQ  D:  08/17/2012  T:  08/18/2012  Job:  [id number]

## 2012-08-18 NOTE — Progress Notes (Addendum)
[address]       [place] [address]             [phone]    1 Day Post-Op  Procedure(s) (LRB): INSERTION PLEURAL DRAINAGE CATHETER (Left) Subjective: Feels ok, no new complaints  Objective   Temp:  [97.7 F (36.5 C)-98.4 F (36.9 C)] 98 F (36.7 C) (05/09 0533) Pulse Rate:  [39-95] 82 (05/09 0533) Resp:  [13-21] 18 (05/09 0533) BP: (114-155)/(34-91) 148/57 mmHg (05/09 0533) SpO2:  [96 %-100 %] 96 % (05/09 0533) Weight:  [103 lb 6.3 oz (46.9 kg)] 103 lb 6.3 oz (46.9 kg) (05/08 1500)   Intake/Output Summary (Last 24 hours) at 08/18/12 0801 Last data filed at 08/18/12 0531  Gross per 24 hour  Intake    640 ml  Output    750 ml  Net   -110 ml       General appearance: alert, cooperative and no distress Heart: regular rate and rhythm and soft systolic murmur Lungs: clear to auscultation bilaterally  Lab Results:  Recent Labs  08/17/12 0758  NA 135  K 4.5  CL 106  CO2 19  GLUCOSE 100*  BUN 27*  CREATININE 1.88*  CALCIUM 8.2*    Recent Labs  08/17/12 0758  AST 27  ALT 24  ALKPHOS 91  BILITOT 0.3  PROT 6.7  ALBUMIN 2.8*   No results found for this basename: LIPASE, AMYLASE,  in the last 72 hours  Recent Labs  08/17/12 0758  WBC 8.3  HGB 10.8*  HCT 31.5*  MCV 87.5  PLT 203   No results found for this basename: CKTOTAL, CKMB, TROPONINI,  in the last 72 hours No components found with this basename: POCBNP,  No results found for this basename: DDIMER,  in the last 72 hours No results found for this basename: HGBA1C,  in the last 72 hours No results found for this basename: CHOL, HDL, LDLCALC, TRIG, CHOLHDL,  in the last 72 hours No results found for this basename: TSH, T4TOTAL, FREET3, T3FREE, THYROIDAB,  in the last 72 hours No results found for this basename: VITAMINB12, FOLATE, FERRITIN, TIBC, IRON, RETICCTPCT,  in the last 72 hours  Medications: Scheduled . acetaminophen  500 mg Oral BID  . ALPRAZolam  0.25 mg Oral  QHS  . amiodarone  200 mg Oral QPM  . amLODipine  10 mg Oral q morning - 10a  . aspirin EC  81 mg Oral Q1200  . ferrous sulfate  325 mg Oral Q1200  . levothyroxine  50 mcg Oral QAC breakfast  . metoprolol tartrate  25 mg Oral Once  . metoprolol  25 mg Oral BID  . mupirocin cream   Topical BID  . potassium chloride  20 mEq Oral Daily     Radiology/Studies:  Dg Chest Port 1 View  08/18/2012  *RADIOLOGY REPORT*  Clinical Data: Status post Pleurx placement  PORTABLE CHEST - 1 VIEW  Comparison: 08/15/2012  Findings: The heart and pulmonary vascularity are within normal limits.  Postsurgical changes are again seen.  A left-sided Pleurx catheter is noted.  Resolution of previously seen pleural effusion is noted.  No pneumothorax is seen.  Right lung is clear.  IMPRESSION: Status post Pleurx catheter without complicating factors. Resolution of previous left-sided pleural effusion.   Original Report Authenticated By: Alcide Clever, M.D.    Dg C-arm 1-60 Min-no Report  08/17/2012  CLINICAL DATA: needed for procedure   C-ARM 1-60 MINUTES  Fluoroscopy was utilized  by the requesting physician.  No radiographic  interpretation.     Pleurx catheter drained  250 cc, patient tolerated well   INR: Will add last result for INR, ABG once components are confirmed Will add last 4 CBG results once components are confirmed  Assessment/Plan: S/P Procedure(s) (LRB): INSERTION PLEURAL DRAINAGE CATHETER (Left) Doing well, stable for D/C home this am For HHN to drain M/W/F for now  LOS: 1 day    [name] E 5/9/20148:01 AM

## 2012-08-22 NOTE — Discharge Summary (Signed)
[address]       [place] [address]             [phone]     [name] Jan 29, 1924 79 y.o. [id number]  08/17/2012 08/18/2012  No att. providers found  RECURRENT L PLEURAL EFFUSION  HPI She is an 79 year old woman who underwent coronary bypass grafting x3 on February 27. Her postoperative course was notable for atrial fibrillation which converted to sinus rhythm with amiodarone. Her course was otherwise uncomplicated and she was discharged home on day 5.  She was seen in the office on March 25. At that time she was doing well overall but did have a left pleural effusion. She was given a prednisone taper but the effusion persisted. She underwent thoracentesis on 07/18/2012. 750 mL's of fluid was withdrawn. She was given another steroid taper. About 3 weeks later she developed shortness of breath and a chest x-ray showed recurrence of her left pleural effusion. She had another thoracentesis done on for 08/03/12, this time a liter was drained. Again she was treated with a steroid taper. Her breathing improved significantly for several days. However over the past week she started getting progressively more short of breath with exertion. After evaluation by Dr. Dorris Fetch it was felt that she would benefit from placement of a Pleurx catheter. She was admitted for the procedure.  Past Medical History   Diagnosis  Date   .  Allergic rhinitis    .  Anemia    .  Coronary artery disease    .  Hyperlipidemia    .  Hypertension    .  Osteopenia    .  GERD (gastroesophageal reflux disease)    .  Anginal pain    .  Heart murmur    .  Shortness of breath      at times"   .  Arthritis      hands osteo    Current Outpatient Prescriptions   Medication  Sig  Dispense  Refill   .  acetaminophen (TYLENOL) 500 MG tablet  Take 2 tablets (1,000 mg total) by mouth every 6 (six) hours as needed for pain.  30 tablet    .  ALPRAZolam (XANAX) 0.25 MG tablet  Take 0.125 mg by mouth daily  as needed for anxiety.     Marland Kitchen  amLODipine (NORVASC) 10 MG tablet  Take 10 mg by mouth daily.     Marland Kitchen  aspirin 325 MG tablet  Take 325 mg by mouth daily.     Marland Kitchen  docusate sodium 100 MG CAPS  Take 200 mg by mouth daily.  10 capsule    .  levothyroxine (SYNTHROID, LEVOTHROID) 50 MCG tablet  Take 50 mcg by mouth daily before breakfast.     .  lisinopril (PRINIVIL,ZESTRIL) 10 MG tablet  Take 10 mg by mouth daily.     .  metoprolol (LOPRESSOR) 50 MG tablet  Take 25 mg by mouth 2 (two) times daily.     .  Potassium Chloride ER 20 MEQ TBCR  Take 20 mEq by mouth daily. For 5 Days  5 tablet  0   .  pravastatin (PRAVACHOL) 80 MG tablet  Take 40 mg by mouth at bedtime.     Marland Kitchen  amiodarone (PACERONE) 200 MG tablet  Take 200 mg by mouth daily.     .  furosemide (LASIX) 40 MG tablet       No current facility-administered medications for this visit.  Hospital Course:  The patient was admitted and the Pleurx catheter was placed on 08/18/2012. She tolerated this procedure well. She was admitted for observation overnight. The following day the drain emptied 250 cc of straw-colored fluid. She was felt to be quite stable for discharge on postoperative day #1 and home health was arranged for drainage of the Pleurx catheter initially q. Monday/Wednesday/Friday.  No results found for this basename: NA, K, CL, CO2, GLUCOSE, BUN, CRATININE, CALCIUM,  in the last 72 hours No results found for this basename: WBC, HGB, HCT, PLT,  in the last 72 hours No results found for this basename: INR,  in the last 72 hours   Discharge Instructions:  The patient is discharged to home with extensive instructions on wound care and progressive ambulation.  They are instructed not to drive or perform any heavy lifting until returning to see the physician in his office.  Discharge Diagnosis:  RECURRENT L PLEURAL EFFUSION  Secondary Diagnosis: Patient Active Problem List   Diagnosis Date Noted  . S/P CABG x 3 06/13/2012  .  Coronary artery disease 06/13/2012  . CONSTIPATION, MILD 05/07/2009  . BACK PAIN, LUMBAR 05/07/2009  . LEG PAIN, BILATERAL 05/07/2009  . VERTIGO 05/16/2008  . COLONIC POLYPS, ADENOMATOUS 09/13/2007  . GERD 04/20/2007  . ANXIETY DISORDER, GENERALIZED 08/29/2006  . SYSTEMIC LUPUS ERYTHEMATOSUS 08/29/2006  . HYPERLIPIDEMIA 08/25/2006  . ANEMIA-NOS 08/25/2006  . HYPERTENSION 08/25/2006  . CAROTID ARTERY DISEASE 08/25/2006  . ALLERGIC RHINITIS 08/25/2006  . SJOGREN'S SYNDROME 08/25/2006  . OSTEOPENIA 08/23/2006   Past Medical History  Diagnosis Date  . Allergic rhinitis   . Anemia   . Coronary artery disease   . Hyperlipidemia   . Hypertension   . Osteopenia   . GERD (gastroesophageal reflux disease)   . Anginal pain   . Heart murmur   . Recurrent pleural effusion on left   . Shortness of breath     "since the heart OR in 05/2012 cause of fluid" (08/17/2012)  . Osteoarthritis of hand         Medication List    TAKE these medications       acetaminophen 500 MG tablet  Commonly known as:  TYLENOL  Take 500 mg by mouth 2 (two) times daily.     ALPRAZolam 0.25 MG tablet  Commonly known as:  XANAX  Take 0.25 mg by mouth See admin instructions. Takes 1 tablet every night but may take an extra 0.25 to 0.5 tablets if needed throughout the day     amiodarone 200 MG tablet  Commonly known as:  PACERONE  Take 200 mg by mouth every evening.     amLODipine 10 MG tablet  Commonly known as:  NORVASC  Take 10 mg by mouth every morning.     aspirin EC 81 MG tablet  Take 81 mg by mouth daily at 12 noon.     ferrous sulfate 325 (65 FE) MG tablet  Take 325 mg by mouth daily at 12 noon.     hydroxypropyl methylcellulose 2.5 % ophthalmic solution  Commonly known as:  ISOPTO TEARS  Place 1 drop into both eyes 2 (two) times daily as needed (dry eyes).     levothyroxine 50 MCG tablet  Commonly known as:  SYNTHROID, LEVOTHROID  Take 50 mcg by mouth daily before breakfast.      magnesium hydroxide 400 MG/5ML suspension  Commonly known as:  MILK OF MAGNESIA  Take 10 mLs by mouth daily as needed for  constipation.     metoprolol 50 MG tablet  Commonly known as:  LOPRESSOR  Take 25 mg by mouth 2 (two) times daily.     Potassium Chloride ER 20 MEQ Tbcr  Take 20 mEq by mouth every morning. For 5 Days     pravastatin 80 MG tablet  Commonly known as:  PRAVACHOL  Take 40 mg by mouth at bedtime.     traMADol 50 MG tablet  Commonly known as:  ULTRAM  Take 1 tablet (50 mg total) by mouth every 6 (six) hours as needed.       Follow-up Information   Follow up with HENDRICKSON,STEVEN C, MD. (2 weeks - office will contact you. Also, obtain chest xray 1 hour prior to appt from [facility].)    Contact information:   [address] [employer] [address] [place] [address] [phone]       Disposition: Discharged home  Patient's condition is Harlin Heys, PA-C 08/22/2012  1:02 PM

## 2012-08-29 ENCOUNTER — Other Ambulatory Visit: Payer: Self-pay | Admitting: *Deleted

## 2012-08-29 ENCOUNTER — Ambulatory Visit (INDEPENDENT_AMBULATORY_CARE_PROVIDER_SITE_OTHER): Payer: Self-pay | Admitting: Thoracic Surgery (Cardiothoracic Vascular Surgery)

## 2012-08-29 ENCOUNTER — Other Ambulatory Visit: Payer: Self-pay | Admitting: Thoracic Surgery (Cardiothoracic Vascular Surgery)

## 2012-08-29 ENCOUNTER — Ambulatory Visit
Admission: RE | Admit: 2012-08-29 | Discharge: 2012-08-29 | Disposition: A | Discharge status: Home / Self Care | Payer: Medicare Other | Source: Ambulatory Visit | Attending: Thoracic Surgery (Cardiothoracic Vascular Surgery) | Admitting: Thoracic Surgery (Cardiothoracic Vascular Surgery)

## 2012-08-29 ENCOUNTER — Encounter: Payer: Self-pay | Admitting: Thoracic Surgery (Cardiothoracic Vascular Surgery)

## 2012-08-29 VITALS — BP 128/56 | HR 47 | Resp 20 | Ht 62.0 in | Wt 103.0 lb

## 2012-08-29 DIAGNOSIS — J90 Pleural effusion, not elsewhere classified: Secondary | ICD-10-CM

## 2012-08-29 DIAGNOSIS — I2510 Atherosclerotic heart disease of native coronary artery without angina pectoris: Secondary | ICD-10-CM

## 2012-08-29 DIAGNOSIS — Z09 Encounter for follow-up examination after completed treatment for conditions other than malignant neoplasm: Secondary | ICD-10-CM

## 2012-08-29 NOTE — Progress Notes (Signed)
HPI:  [name] returns today for a scheduled followup visit. She had coronary bypass grafting x3 on February 27. Her postoperative course was remarkable for atrial fibrillation which was treated with amiodarone. She converted to sinus rhythm prior to discharge. She's had difficulty with recurrent left pleural effusions. We have done thoracentesis on multiple occasions. Finally we went ahead and did a Pleurx catheter on May 8. She's only been draining between 50 and 100 mL each time.  She says that she feels poorly. She complains of being very weak and tired and short of breath with any exertion. Her son says that she is feeling poorly last week and he had a member of their [employer] come by who is a paramedic. Her pulse was about 40 and her metoprolol dose was cut from 50 mg a day to 25 mg a day. Another time she felt poorly again and they contacted Dr. Darrold Junker' office. Her amiodarone was then stopped. She still continues to feel weak and tired with any exertion at all.  Past Medical History  Diagnosis Date  . Allergic rhinitis   . Anemia   . Coronary artery disease   . Hyperlipidemia   . Hypertension   . Osteopenia   . GERD (gastroesophageal reflux disease)   . Anginal pain   . Heart murmur   . Recurrent pleural effusion on left   . Shortness of breath     "since the heart OR in 05/2012 cause of fluid" (08/17/2012)  . Osteoarthritis of hand       Current Outpatient Prescriptions  Medication Sig Dispense Refill  . acetaminophen (TYLENOL) 500 MG tablet Take 500 mg by mouth 2 (two) times daily.      Marland Kitchen ALPRAZolam (XANAX) 0.25 MG tablet Take 0.25 mg by mouth See admin instructions. Takes 1 tablet every night but may take an extra 0.25 to 0.5 tablets if needed throughout the day      . amLODipine (NORVASC) 10 MG tablet Take 10 mg by mouth every morning.       Marland Kitchen aspirin EC 81 MG tablet Take 81 mg by mouth daily at 12 noon.      . ferrous sulfate 325 (65 FE) MG tablet Take 325 mg by mouth  daily at 12 noon.      . hydroxypropyl methylcellulose (ISOPTO TEARS) 2.5 % ophthalmic solution Place 1 drop into both eyes 2 (two) times daily as needed (dry eyes).      Marland Kitchen levothyroxine (SYNTHROID, LEVOTHROID) 50 MCG tablet Take 50 mcg by mouth daily before breakfast.       . magnesium hydroxide (MILK OF MAGNESIA) 400 MG/5ML suspension Take 10 mLs by mouth daily as needed for constipation.      . Potassium Chloride ER 20 MEQ TBCR Take 20 mEq by mouth every morning. For 5 Days      . pravastatin (PRAVACHOL) 80 MG tablet Take 40 mg by mouth at bedtime.       . traMADol (ULTRAM) 50 MG tablet Take 1 tablet (50 mg total) by mouth every 6 (six) hours as needed.  30 tablet  0   No current facility-administered medications for this visit.    Physical Exam BP 128/56  Pulse 47  Resp 20  Ht 5\' 2"  (1.575 m)  Wt 103 lb (46.72 kg)  BMI 18.83 kg/m2  SpO86 42% 80 year old woman in no acute distress Incision well-healed Cardiac bradycardic, regular, no rubs Lungs clear with equal breath sounds bilaterally  Diagnostic Tests: Chest x-ray 08/29/2012  CHEST - 2 VIEW  Comparison: 08/18/2012  Findings: Cardiomegaly again noted. Status post CABG. Stable left  pleural catheter. Dextroscoliosis thoracic spine again noted.  There is atelectasis or infiltrate in the lingula. Chronic  blunting of the left costophrenic angle is stable. Right lung is  clear.  IMPRESSION:  Status post CABG. Stable left pleural catheter. Dextroscoliosis  thoracic spine again noted. There is atelectasis or infiltrate in  the lingula. Chronic blunting of the left costophrenic angle is  stable.   Impression: 79 year old woman status post coronary bypass grafting about 2 and half months ago. She had atrial fibrillation postoperatively. She currently is having problems with bradycardia. I recommended that they stopped metoprolol given thather rate is only 47 today despite having cut her metoprolol in half in the seeing her  amiodarone in the last couple of weeks. She has an appointment with Dr. Darrold Junker next week. I hope this is merely medication related and not a manifestation of tachybradycardia syndrome. If it is she might need a pacemaker.  Regarding her pleural effusion. She's having minimal drainage. Her chest x-ray looks good. We will continue with 3 times weekly drainage for 2 more weeks and also her back at that time. If she continues to have minimal drainage and her x-ray shows good aeration and will likely wait about another week and then remove the tube.  Plan:  1. Discontinue metoprolol.  2. Continue 3 times weekly drainage a Pleurx catheter  3. Return in 2 weeks with PA and lateral chest x-ray to followup left effusion

## 2012-09-05 ENCOUNTER — Ambulatory Visit: Payer: Medicare Other | Admitting: Thoracic Surgery (Cardiothoracic Vascular Surgery)

## 2012-09-05 DIAGNOSIS — I2510 Atherosclerotic heart disease of native coronary artery without angina pectoris: Secondary | ICD-10-CM

## 2012-09-08 ENCOUNTER — Other Ambulatory Visit: Payer: Self-pay | Admitting: *Deleted

## 2012-09-08 DIAGNOSIS — I2510 Atherosclerotic heart disease of native coronary artery without angina pectoris: Secondary | ICD-10-CM

## 2012-09-10 ENCOUNTER — Encounter: Payer: Self-pay | Admitting: Cardiology

## 2012-09-12 ENCOUNTER — Ambulatory Visit (INDEPENDENT_AMBULATORY_CARE_PROVIDER_SITE_OTHER): Payer: Medicare Other | Admitting: Thoracic Surgery (Cardiothoracic Vascular Surgery)

## 2012-09-12 ENCOUNTER — Ambulatory Visit
Admission: RE | Admit: 2012-09-12 | Discharge: 2012-09-12 | Disposition: A | Discharge status: Home / Self Care | Payer: Medicare Other | Source: Ambulatory Visit | Attending: Thoracic Surgery (Cardiothoracic Vascular Surgery) | Admitting: Thoracic Surgery (Cardiothoracic Vascular Surgery)

## 2012-09-12 ENCOUNTER — Encounter: Payer: Self-pay | Admitting: Thoracic Surgery (Cardiothoracic Vascular Surgery)

## 2012-09-12 VITALS — BP 130/65 | HR 101 | Resp 20 | Ht 62.0 in | Wt 103.0 lb

## 2012-09-12 DIAGNOSIS — Z951 Presence of aortocoronary bypass graft: Secondary | ICD-10-CM

## 2012-09-12 DIAGNOSIS — J90 Pleural effusion, not elsewhere classified: Secondary | ICD-10-CM

## 2012-09-12 DIAGNOSIS — I2510 Atherosclerotic heart disease of native coronary artery without angina pectoris: Secondary | ICD-10-CM

## 2012-09-12 NOTE — Progress Notes (Signed)
HPI:  [name] is an 79 year old woman who had coronary bypass grafting on February 27. She had recurrent left pleural effusions despite multiple thoracenteses. We did a left Pleurx catheter on May 8. She was last in the office 2 weeks ago at which time she was feeling poorly. She was very bradycardic. Her beta blocker and amiodarone and just been discontinued. She was having minimal drainage from the Pleurx catheter.  Since her last visit she says she's feeling much better. She still gets tired with exertion but is able to do more than she was 2 weeks ago. The drainage from the Pleurx catheters continued to decrease and she only had a few milliliters of drainage of each of the last 2 occasions.  Past Medical History  Diagnosis Date  . Allergic rhinitis   . Anemia   . Coronary artery disease   . Hyperlipidemia   . Hypertension   . Osteopenia   . GERD (gastroesophageal reflux disease)   . Anginal pain   . Heart murmur   . Recurrent pleural effusion on left   . Shortness of breath     "since the heart OR in 05/2012 cause of fluid" (08/17/2012)  . Osteoarthritis of hand       Current Outpatient Prescriptions  Medication Sig Dispense Refill  . acetaminophen (TYLENOL) 500 MG tablet Take 500 mg by mouth 2 (two) times daily.      Marland Kitchen ALPRAZolam (XANAX) 0.25 MG tablet Take 0.25 mg by mouth See admin instructions. Takes 1 tablet every night but may take an extra 0.25 to 0.5 tablets if needed throughout the day      . amLODipine (NORVASC) 10 MG tablet Take 10 mg by mouth every morning.       Marland Kitchen aspirin EC 81 MG tablet Take 81 mg by mouth daily at 12 noon.      . ferrous sulfate 325 (65 FE) MG tablet Take 325 mg by mouth daily at 12 noon.      . hydroxypropyl methylcellulose (ISOPTO TEARS) 2.5 % ophthalmic solution Place 1 drop into both eyes 2 (two) times daily as needed (dry eyes).      Marland Kitchen levothyroxine (SYNTHROID, LEVOTHROID) 50 MCG tablet Take 50 mcg by mouth daily before breakfast.       .  magnesium hydroxide (MILK OF MAGNESIA) 400 MG/5ML suspension Take 10 mLs by mouth daily as needed for constipation.      . Potassium Chloride ER 20 MEQ TBCR Take 20 mEq by mouth every morning. For 5 Days      . pravastatin (PRAVACHOL) 80 MG tablet Take 40 mg by mouth at bedtime.       . traMADol (ULTRAM) 50 MG tablet Take 1 tablet (50 mg total) by mouth every 6 (six) hours as needed.  30 tablet  0   No current facility-administered medications for this visit.    Physical Exam BP 130/65  Pulse 101  Resp 20  Ht 5\' 2"  (1.575 m)  Wt 103 lb (46.72 kg)  BMI 18.83 kg/m2  SpO2 95% Lungs clear with equal breath sounds bilaterally   Diagnostic Tests: Chest x-ray 09/12/2012 shows good aeration of both lungs and no significant effusion  Impression: 79 year old woman now a little over 3 months out from coronary bypass grafting. She had a recurrent left pleural effusion which resolved about the time we had put in a Pleurx catheter. I was originally going to wait until next week to DC the catheter. But she's had almost no  drainage in the past week and there is no sign of the effusion on the chest x-ray, so we'll go ahead and remove it in the office today.  Procedure note Using sterile technique and 3 mL of 1% lidocaine local anesthetic, the Pleurx catheter was removed intact without difficulty. She tolerated the procedure well.  Plan: Plan to see her back in 4 weeks with a repeat chest x-ray

## 2012-10-10 ENCOUNTER — Encounter: Payer: Self-pay | Admitting: Cardiology

## 2012-10-11 ENCOUNTER — Other Ambulatory Visit: Payer: Self-pay | Admitting: *Deleted

## 2012-10-11 DIAGNOSIS — I2510 Atherosclerotic heart disease of native coronary artery without angina pectoris: Secondary | ICD-10-CM

## 2012-10-17 ENCOUNTER — Ambulatory Visit (INDEPENDENT_AMBULATORY_CARE_PROVIDER_SITE_OTHER): Payer: Medicare Other | Admitting: Thoracic Surgery (Cardiothoracic Vascular Surgery)

## 2012-10-17 ENCOUNTER — Ambulatory Visit
Admission: RE | Admit: 2012-10-17 | Discharge: 2012-10-17 | Disposition: A | Discharge status: Home / Self Care | Payer: Medicare Other | Source: Ambulatory Visit | Attending: Thoracic Surgery (Cardiothoracic Vascular Surgery) | Admitting: Thoracic Surgery (Cardiothoracic Vascular Surgery)

## 2012-10-17 VITALS — BP 146/77 | HR 103 | Resp 16 | Ht 62.0 in | Wt 99.0 lb

## 2012-10-17 DIAGNOSIS — Z951 Presence of aortocoronary bypass graft: Secondary | ICD-10-CM

## 2012-10-17 DIAGNOSIS — Z09 Encounter for follow-up examination after completed treatment for conditions other than malignant neoplasm: Secondary | ICD-10-CM

## 2012-10-17 DIAGNOSIS — I2510 Atherosclerotic heart disease of native coronary artery without angina pectoris: Secondary | ICD-10-CM

## 2012-10-17 DIAGNOSIS — J90 Pleural effusion, not elsewhere classified: Secondary | ICD-10-CM

## 2012-10-17 NOTE — Progress Notes (Signed)
HPI:  [name] returns today for followup of her left pleural effusion.  She had coronary bypass grafting in February of this year. She had problems with recurrent left pleural effusions. After multiple thoracenteses and trials and steroids we followed the Pleurx catheter on may 8. Shortly after that her drainage started dropping of dramatically and eventually went away altogether. We were able to discontinue her Pleurx catheter on June 3. She now returns for one month followup visit to make sure that there is been no reaccumulation of fluid.  She says that she's been feeling well. She has started heart strides. She is walking 2 miles a day without any shortness of breath.  Past Medical History  Diagnosis Date  . Allergic rhinitis   . Anemia   . Coronary artery disease   . Hyperlipidemia   . Hypertension   . Osteopenia   . GERD (gastroesophageal reflux disease)   . Anginal pain   . Heart murmur   . Recurrent pleural effusion on left   . Shortness of breath     "since the heart OR in 05/2012 cause of fluid" (08/17/2012)  . Osteoarthritis of hand       Current Outpatient Prescriptions  Medication Sig Dispense Refill  . acetaminophen (TYLENOL) 500 MG tablet Take 500 mg by mouth 2 (two) times daily.      Marland Kitchen ALPRAZolam (XANAX) 0.25 MG tablet Take 0.25 mg by mouth See admin instructions. Takes 1 tablet every night but may take an extra 0.25 to 0.5 tablets if needed throughout the day      . aspirin EC 81 MG tablet Take 81 mg by mouth daily at 12 noon.      . ferrous sulfate 325 (65 FE) MG tablet Take 325 mg by mouth daily at 12 noon.      . hydroxypropyl methylcellulose (ISOPTO TEARS) 2.5 % ophthalmic solution Place 1 drop into both eyes 2 (two) times daily as needed (dry eyes).      Marland Kitchen levothyroxine (SYNTHROID, LEVOTHROID) 50 MCG tablet Take 50 mcg by mouth daily before breakfast.       . magnesium hydroxide (MILK OF MAGNESIA) 400 MG/5ML suspension Take 10 mLs by mouth daily as needed  for constipation.      . Potassium Chloride ER 20 MEQ TBCR Take 20 mEq by mouth every morning. For 5 Days      . pravastatin (PRAVACHOL) 80 MG tablet Take 40 mg by mouth at bedtime.       . clobetasol cream (TEMOVATE) 0.05 %       . fluorometholone (FML) 0.1 % ophthalmic suspension       . hydroxychloroquine (PLAQUENIL) 200 MG tablet       . polyethylene glycol powder (GLYCOLAX/MIRALAX) powder       . potassium chloride SA (K-DUR,KLOR-CON) 20 MEQ tablet       . predniSONE (DELTASONE) 10 MG tablet       . triamcinolone cream (KENALOG) 0.1 %        No current facility-administered medications for this visit.    Physical Exam BP 146/77  Pulse 103  Resp 16  Ht 5\' 2"  (1.575 m)  Wt 99 lb (44.906 kg)  BMI 18.1 kg/m2  SpO96 2% 79 year old woman in no acute distress Lungs clear with equal breath sounds bilaterally Cardiac exam regular rate and rhythm normal S1 and S2 Sternum stable, incision well-healed No peripheral edema  Diagnostic Tests: Chest x-ray 10/17/2012 No pleural effusion   Impression: [name]  is an 79 year old woman who had recurring left pleural effusions after coronary bypass grafting. This ultimately required a Pleurx catheter. The catheter was removed a month ago and she's had no sign of recurrence of the pleural effusion. No further followup is necessary.  She will continue to be followed by Dr. Randa Lynn and Dr. Darrold Junker.   I will be happy to see her back any time the future during the of any further assistance with her care.

## 2012-11-10 ENCOUNTER — Encounter: Payer: Self-pay | Admitting: Cardiology

## 2012-12-11 ENCOUNTER — Encounter: Payer: Self-pay | Admitting: Cardiology

## 2013-01-28 ENCOUNTER — Inpatient Hospital Stay: Payer: Self-pay | Admitting: Internal Medicine

## 2013-01-28 DIAGNOSIS — I4891 Unspecified atrial fibrillation: Secondary | ICD-10-CM

## 2013-01-28 LAB — BASIC METABOLIC PANEL
Anion Gap: 10 (ref 7–16)
BUN: 20 mg/dL — ABNORMAL HIGH (ref 7–18)
Calcium, Total: 8.7 mg/dL (ref 8.5–10.1)
Co2: 21 mmol/L (ref 21–32)
EGFR (African American): 33 — ABNORMAL LOW
Glucose: 101 mg/dL — ABNORMAL HIGH (ref 65–99)
Potassium: 3.4 mmol/L — ABNORMAL LOW (ref 3.5–5.1)

## 2013-01-28 LAB — TROPONIN I: Troponin-I: 0.03 ng/mL

## 2013-01-28 LAB — CBC
HCT: 36.6 % (ref 35.0–47.0)
MCH: 31.2 pg (ref 26.0–34.0)
MCHC: 34 g/dL (ref 32.0–36.0)
MCV: 92 fL (ref 80–100)
WBC: 6.5 10*3/uL (ref 3.6–11.0)

## 2013-01-28 LAB — URINALYSIS, COMPLETE
Bacteria: NONE SEEN
Hyaline Cast: 2
Ketone: NEGATIVE
Leukocyte Esterase: NEGATIVE
Ph: 7 (ref 4.5–8.0)
Specific Gravity: 1.008 (ref 1.003–1.030)

## 2013-01-29 LAB — BASIC METABOLIC PANEL
BUN: 23 mg/dL — ABNORMAL HIGH (ref 7–18)
Chloride: 110 mmol/L — ABNORMAL HIGH (ref 98–107)
Co2: 21 mmol/L (ref 21–32)
EGFR (African American): 32 — ABNORMAL LOW
EGFR (Non-African Amer.): 28 — ABNORMAL LOW
Osmolality: 279 (ref 275–301)
Potassium: 3.7 mmol/L (ref 3.5–5.1)

## 2013-01-29 LAB — CBC WITH DIFFERENTIAL/PLATELET
Basophil #: 0.1 10*3/uL (ref 0.0–0.1)
Eosinophil %: 3.8 %
HCT: 31.1 % — ABNORMAL LOW (ref 35.0–47.0)
Lymphocyte #: 1.7 10*3/uL (ref 1.0–3.6)
Lymphocyte %: 26.3 %
MCV: 91 fL (ref 80–100)
Neutrophil %: 59 %
RBC: 3.41 10*6/uL — ABNORMAL LOW (ref 3.80–5.20)
RDW: 13.5 % (ref 11.5–14.5)

## 2013-01-29 LAB — LIPID PANEL
Cholesterol: 144 mg/dL (ref 0–200)
HDL Cholesterol: 31 mg/dL — ABNORMAL LOW (ref 40–60)
Ldl Cholesterol, Calc: 97 mg/dL (ref 0–100)
Triglycerides: 80 mg/dL (ref 0–200)

## 2013-01-29 LAB — TROPONIN I: Troponin-I: 0.03 ng/mL

## 2013-08-14 ENCOUNTER — Observation Stay: Payer: Self-pay | Admitting: Internal Medicine

## 2013-08-14 LAB — TROPONIN I
Troponin-I: <0.02 ng/mL
Troponin-I: <0.02 ng/mL

## 2013-08-14 LAB — CBC
HCT: 30.6 % — ABNORMAL LOW (ref 35.0–47.0)
HGB: 10.1 g/dL — ABNORMAL LOW (ref 12.0–16.0)
MCH: 30.8 pg (ref 26.0–34.0)
MCHC: 33 g/dL (ref 32.0–36.0)
MCV: 93 fL (ref 80–100)
Platelet: 178 10*3/uL (ref 150–440)
RBC: 3.28 10*6/uL — AB (ref 3.80–5.20)
RDW: 13.7 % (ref 11.5–14.5)
WBC: 6.1 10*3/uL (ref 3.6–11.0)

## 2013-08-14 LAB — BASIC METABOLIC PANEL
Anion Gap: 6 — ABNORMAL LOW (ref 7–16)
BUN: 29 mg/dL — ABNORMAL HIGH (ref 7–18)
CALCIUM: 9.1 mg/dL (ref 8.5–10.1)
CO2: 27 mmol/L (ref 21–32)
CREATININE: 1.78 mg/dL — AB (ref 0.60–1.30)
Chloride: 106 mmol/L (ref 98–107)
EGFR (African American): 29 — ABNORMAL LOW
EGFR (Non-African Amer.): 25 — ABNORMAL LOW
GLUCOSE: 111 mg/dL — AB (ref 65–99)
OSMOLALITY: 284 (ref 275–301)
Potassium: 3.6 mmol/L (ref 3.5–5.1)
Sodium: 139 mmol/L (ref 136–145)

## 2013-08-14 LAB — CK TOTAL AND CKMB (NOT AT ARMC)
CK, Total: 37 U/L
CK, Total: 37 U/L
CK-MB: 1.1 ng/mL (ref 0.5–3.6)
CK-MB: 0.9 ng/mL (ref 0.5–3.6)

## 2014-04-15 DIAGNOSIS — S92351D Displaced fracture of fifth metatarsal bone, right foot, subsequent encounter for fracture with routine healing: Secondary | ICD-10-CM | POA: Diagnosis not present

## 2014-04-15 DIAGNOSIS — M79671 Pain in right foot: Secondary | ICD-10-CM | POA: Diagnosis not present

## 2014-04-29 DIAGNOSIS — I480 Paroxysmal atrial fibrillation: Secondary | ICD-10-CM | POA: Diagnosis not present

## 2014-04-29 DIAGNOSIS — I10 Essential (primary) hypertension: Secondary | ICD-10-CM | POA: Diagnosis not present

## 2014-04-29 DIAGNOSIS — I25810 Atherosclerosis of coronary artery bypass graft(s) without angina pectoris: Secondary | ICD-10-CM | POA: Diagnosis not present

## 2014-04-29 DIAGNOSIS — Z8679 Personal history of other diseases of the circulatory system: Secondary | ICD-10-CM | POA: Diagnosis not present

## 2014-05-15 DIAGNOSIS — S92351D Displaced fracture of fifth metatarsal bone, right foot, subsequent encounter for fracture with routine healing: Secondary | ICD-10-CM | POA: Diagnosis not present

## 2014-06-03 DIAGNOSIS — I480 Paroxysmal atrial fibrillation: Secondary | ICD-10-CM | POA: Diagnosis not present

## 2014-06-03 DIAGNOSIS — E538 Deficiency of other specified B group vitamins: Secondary | ICD-10-CM | POA: Diagnosis not present

## 2014-06-03 DIAGNOSIS — E782 Mixed hyperlipidemia: Secondary | ICD-10-CM | POA: Diagnosis not present

## 2014-06-03 DIAGNOSIS — D638 Anemia in other chronic diseases classified elsewhere: Secondary | ICD-10-CM | POA: Diagnosis not present

## 2014-06-10 DIAGNOSIS — I10 Essential (primary) hypertension: Secondary | ICD-10-CM | POA: Diagnosis not present

## 2014-06-10 DIAGNOSIS — N183 Chronic kidney disease, stage 3 (moderate): Secondary | ICD-10-CM | POA: Diagnosis not present

## 2014-06-10 DIAGNOSIS — I480 Paroxysmal atrial fibrillation: Secondary | ICD-10-CM | POA: Diagnosis not present

## 2014-06-10 DIAGNOSIS — D638 Anemia in other chronic diseases classified elsewhere: Secondary | ICD-10-CM | POA: Diagnosis not present

## 2014-06-25 DIAGNOSIS — H35383 Toxic maculopathy, bilateral: Secondary | ICD-10-CM | POA: Diagnosis not present

## 2014-07-03 DIAGNOSIS — L930 Discoid lupus erythematosus: Secondary | ICD-10-CM | POA: Diagnosis not present

## 2014-07-03 DIAGNOSIS — Z79899 Other long term (current) drug therapy: Secondary | ICD-10-CM | POA: Diagnosis not present

## 2014-07-04 DIAGNOSIS — L930 Discoid lupus erythematosus: Secondary | ICD-10-CM | POA: Diagnosis not present

## 2014-07-04 DIAGNOSIS — Z5181 Encounter for therapeutic drug level monitoring: Secondary | ICD-10-CM | POA: Diagnosis not present

## 2014-07-08 DIAGNOSIS — M7751 Other enthesopathy of right foot: Secondary | ICD-10-CM | POA: Diagnosis not present

## 2014-07-08 DIAGNOSIS — M79671 Pain in right foot: Secondary | ICD-10-CM | POA: Diagnosis not present

## 2014-08-02 NOTE — Discharge Summary (Signed)
PATIENT NAME:  [name], [name] MR#:  [id number] DATE OF BIRTH:  Apr 15, 1923  DATE OF ADMISSION:  06/06/2012 DATE OF DISCHARGE:  06/06/2012  DISPOSITION: Discharged to a tertiary care center, [facility], on 06/06/2012.   ADMISSION DIAGNOSIS: Unstable angina.   DISCHARGE DIAGNOSES: 1. Unstable angina.  2. Mild elevation of troponin, no myocardial infarction.  3. Three-vessel coronary artery disease, status post recent percutaneous transluminal angioplasty on left main 06/01/2012 by Dr. Saralyn Pilar.  4. Anemia, likely posthemorrhagic acute into soft tissues of right groin vascular access.  5. Acute renal failure, improving, likely due to intravenous dye.  6. Hypertension.  7. Gallstones, no cholecystitis.  8. Suspected congestive heart failure, acute diastolic, due to fluid overload in the hospital for renal failure therapy.   DISCHARGE CONDITION: Stable.   DISCHARGE MEDICATIONS: The patient is to continue her outpatient medications which are: 1. Aspirin 325 mg p.o. daily.  2. Lisinopril 10 mg p.o. daily.  The patient's lisinopril has been suspended now due to acute renal failure.  3. Metoprolol tartrate 25 mg p.o., 2 tablets twice daily.  4. Pravachol 80 mg p.o. at bedtime.  5. Restasis 0.05% ophthalmic emulsion, 1 drop twice daily.   CONSULTANTS: Dr. Ubaldo Glassing.   LABORATORY AND RADIOLOGICAL DATA:  Chest x-ray, portable single view on 06/05/2012, showed no acute abnormality. CT scan of abdomen and pelvis without contrast on 06/06/2012 revealed mild distention of the gallbladder with multiple calcified stones which may indicate acute cholecystitis as well as cholelithiasis. Follow-up ultrasound would be useful, according to the radiologist. In the right groin, there is increased soft tissue density which may be related to previous vascular access. There is no discrete hematoma demonstrated. There are no bulky inguinal lymph nodes. There is no evidence of acute urinary tract abnormality. No  acute bowel abnormality. Ultrasound of abdomen, limited survey on 06/06/2012, right upper quadrant: Multiple mobile gallstones without sonographic evidence of acute cholecystitis noted. There is a simple-appearing cyst in the left hepatic lobe consistent with CT findings. No pancreatic abnormality is demonstrated. The common bile duct is normal. Right lower extremity Doppler ultrasound on 06/06/2012 showed no evidence of DVT in the right lower extremity. EKG showed normal sinus rhythm 90 beats minutes, LVH with repolarization abnormalities, anteroseptal infarct which was already noted  in December 2012, and no significant acute ST-T changes were noted. Chest x-ray was within normal limits.   On arrival to the hospital, glucose was 106, BNP was 3281, BUN and creatinine were 26 and 1.74, otherwise BMP was unremarkable. Estimated GFR for African American would be 30, for a non-African American would be 26.  Calcium level was low at 7.9. The patient's liver enzymes revealed albumin level of 2.9, otherwise liver enzymes were normal. Cardiac enzymes x2 were within normal limits. The patient's white blood cell count was normal at 7.3, hemoglobin was 8.3, platelet count 167. Coagulation panel was normal. Urinalysis was unremarkable except a few white blood cells were noted, 4 white blood cells were noted, as well as 3+ bacteria, otherwise, no abnormalities.   HISTORY AND PHYSICAL: The patient is an 81 year old female with past medical history significant for history of recent admission to the hospital for cardiac catheterization on 06/01/2012 by Dr. Saralyn Pilar which revealed 3-vessel disease, status post percutaneous intervention of left main artery, presented back to the hospital on 06/06/2012  with complaints of recurrent chest pains. Please refer to Dr. Boykin Reaper admission note on 06/06/2012.   On arrival to the hospital, she was noted to have temperature  of 98.2, pulse was 88, respiratory rate was normal, blood  pressure 154/69. Saturation was 97% on room air. Physical exam revealed bruising in the right groin going up into her abdominal wall area with some tenderness to palpation. No other abnormalities were noted.   HOSPITAL COURSE: The patient was admitted to the hospital to cycle cardiac enzymes.  Her cardiac enzymes were normal. She was consulted by a cardiologist, Dr. Ubaldo Glassing, who felt that the patient would be best served to be sent to a tertiary care center for consideration of coronary artery bypass grafting. The patient is being discharged to [facility] on 06/06/2012 to consider coronary artery bypass grafting.  While in the hospital, the patient had her anemia investigated. The patient had iron studies done. The patient's iron level was found to be low at 41. Total iron binding capacity was 248, which was somewhat low. Iron saturation was 17, however, ferritin level was low at 48, signifying iron deficiency anemia. The patient's hemoglobin level was noted to decrease from a level of 12.9 on 07/06/2011 to a level of 8.3 on the day of admission, 06/05/2012, in approximately 1 year. It was felt that, at least partially, the patient's iron deficiency anemia could have been related to some hemorrhage from vascular access site in the right groin, as the patient was noted to have a hemorrhage area subcutaneously. The patient did have a CT scan of her abdomen and pelvis done to evaluate for possible retroperitoneal hemorrhage. However, no further abnormalities were found except a right groin soft tissue density was noted which did not show any discrete hematoma.   In regards to acute renal failure, as mentioned above, on presentation the patient was noted to have acute renal failure with creatinine level of 1.74 on the 06/05/2012.  The patient was initiated on IV fluids, and her kidney function improved somewhat. On the day of discharge, 06/06/2012, the patient's BUN and creatinine were 22 and 1.56,  respectively. It is recommended to continue the patient on some IV fluids; however, during her re-evaluation today on 06/06/2012, she was noted to have some rales in the posterior aspect of her lungs, especially the left posterior lower lung area, concerning for possible congestive heart failure. It is recommended to get a chest x-ray repeated and be cautious with IV fluid administration. It was felt that the patient's kidney function abnormalities could have been related to ACE inhibitors as well as possibly recent IV dye received during cardiac catheterization.  In regards to hypertension, the patient's blood pressure was poorly controlled while she was in the hospital. She had her blood pressure medications resumed except of ACE inhibitor.  She also had nitroglycerin added topically. On the day of discharge, 06/06/2012, the patient's vital signs revealed temperature was 98, pulse was 81, respiratory rate was 20, blood pressure 162/61, saturation was 96% on room air at rest.   In regards to gallstones, as mentioned above, CT scan of abdomen revealed gallstones. The patient was clinically asymptomatic. Ultrasound of abdomen also showed gallstones, however, no common bile duct dilatation or inflammation around the gallbladder itself was noted. The patient is being discharged to a tertiary care center for consideration of coronary artery bypass grafting today, on 06/05/2012, in stable condition with the above-mentioned medications and follow-up.      TIME SPENT:  40 minutes.   ____________________________ Theodoro Grist, MD rv:cb D: 06/06/2012 13:50:32 ET T: 06/06/2012 15:09:05 ET JOB#: [id number]  cc: Theodoro Grist, MD, <Dictator> Aaylah Pokorny Ether Griffins MD ELECTRONICALLY SIGNED  07/12/2012 14:18 

## 2014-08-02 NOTE — H&P (Signed)
PATIENT NAME:  [name], [name] MR#:  [id number] DATE OF BIRTH:  24-Mar-1924  DATE OF ADMISSION:  06/06/2012  PRIMARY CARE PHYSICIAN: Apolonio Schneiders.   CARDIOLOGIST: Dr. Saralyn Pilar.   CHIEF COMPLAINT: Chest pain.   HISTORY OF PRESENT ILLNESS: An 81 year old female patient with a history of hypertension, coronary artery disease who had a cath on 06/01/2012 with Dr. Saralyn Pilar, with percutaneous intervention of the left main, presents to the Emergency Room with recurrent chest pain on exertion. The patient had onset of chest pain with minimal walking of about 20 to 30 feet, and resolves with rest. No radiation. Associated with shortness of breath. No nausea, light headedness. The patient had new-onset chest pain, shortness of breath since December, and had a cath for that reason. She feels like she was told that a CABG would be the next step.   She also complains of right groin pain and bruising at the cath site.   The patient has been found to have anemia of 8.6, elevated creatinine of 1.76 in the Emergency Room. Last creatinine and hemoglobin were stable from March of 2013.   PAST MEDICAL HISTORY: 1. Coronary artery disease, with cardiac cath done on 06/03/2012 showing proximal LAD  90% stenosis, mid-LAD 95% stenosis, mid-RCA 70% stenosis, and ostial left main  70% stenosis.  2. Hypertension.  3. Hyperlipidemia.  4. Anxiety.  5. Arthritis.  6. Skin lupus.  7. Iron deficiency anemia.   PAST SURGICAL HISTORY: Cardiac cath.   ALLERGIES: No known drug allergies.   MEDICATIONS: 1. Aspirin 325 mg oral once a day.  2. Pravastatin 40 mg oral once a day.  3. Lisinopril 20 mg oral once a day.  4. Metoprolol tartrate 50 mg oral twice a day.   SOCIAL HISTORY: The patient does not smoke. No alcohol. No illicit drugs. Lives alone. She  used to work at a [employer].   FAMILY HISTORY: Father died of liver cancer. Mother died of congestive heart failure.   REVIEW OF SYSTEMS:  CONSTITUTIONAL: No fever. Has  fatigue and weakness.  EYES: No blurred vision, pain, or redness.  ENT: No tinnitus, ear pain, hearing loss.  RESPIRATORY: Has shortness of breath. No cough, wheezing.  CARDIOVASCULAR: Has chest pain. No orthopnea, edema.  GASTROINTESTINAL: No nausea, vomiting, diarrhea, abdominal pain.  GENITOURINARY: No dysuria, hematuria, or frequency.  ENDOCRINE: No polyuria, nocturia, thyroid problems.  HEMATOLOGIC/LYMPHATIC: Has iron deficiency anemia; bruising in the right groin. No bleeding.  INTEGUMENTARY: No skin rash, lesions.  MUSCULOSKELETAL: No back pain, shoulder pain.  NEUROLOGIC: No focal numbness, weakness, dysarthria.  PSYCHIATRIC: Has anxiety. No depression, insomnia.   PHYSICAL EXAMINATION: Shows temperature 98.2, pulse of 88, blood pressure 154/69, saturating 97% on room air.   GENERAL: Elderly Caucasian female patient lying in bed, comfortable, conversational, cooperative with exam.  PSYCHIATRIC: Alert, oriented x 3. Mood and affect appropriate. Judgment intact.  HEENT: Atraumatic, normocephalic. Oral mucosa moist and pink. External ears and nose normal. Pallor positive. No icterus. Pupils bilaterally equal and reactive to light.  NECK: Supple. No thyromegaly. No palpable lymph nodes. Trachea midline. No carotid bruits, JVD.  CARDIOVASCULAR: S1, S2. Ejection systolic murmur, not radiating to the carotids. Peripheral pulses 2+. No edema.  RESPIRATORY: Normal work of breathing. Clear to auscultation on both sides.  GASTROINTESTINAL: Soft abdomen, nontender. Bowel sounds present. No hepatosplenomegaly palpable. SKIN: Warm and dry. No petechiae or cyanosis.  EXTREMITIES: Has right groin bruising extending into the abdominal wall, with tenderness.  MUSCULOSKELETAL: No joint swelling, redness, effusion of  the large joints. Normal muscle tone.  NEUROLOGICAL: Motor strength 5 x 5 in upper and lower extremities. Sensation to fine touch intact all over.   Laboratory studies show glucose of  106, BUN 26, creatinine 1.74.  Sodium 139, potassium 4. AST, ALT, alkaline phosphatase normal. Troponin less than 0.02. CK of 24.   WBC 7.3. Hemoglobin 8.3, with platelets of 167, with MCV of 92, INR of 1.   EKG shows poor R wave progression. Normal sinus rhythm.   Chest x-ray shows no acute abnormalities.   Ultrasound of the right groin shows no hematoma or aneurysm.   ASSESSMENT AND PLAN: 1. Unstable angina, with worsening chest pain: The patient had a catheterization a week prior. Reviewed records. The patient might actually need a coronary artery bypass grafting, but will consult Dr. Saralyn Pilar with cardiology and get his input on the case. Will check two more sets of cardiac enzymes. The patient will be on nitro paste, beta blocker, and a statin. Aspirin shall be continued. No heparin at this time. Cardiac enzymes normal.  2. Acute renal failure, likely secondary to contrast load from the catheterization: Will start her on IV fluids and monitor.  3. Acute anemia, with hemoglobin 8.6; last known hemoglobin 12.5: Concern for possible large hematoma in the groin/abdominal wall, status post catheterization. Ultrasound Dopplers have not shown any abnormalities in the groin. Will get a CT abdomen and pelvis to look for a hematoma, abdominal or groin, or retroperitoneal. Will also check stool for Hemoccult and get iron studies.  4. Hypertension: We will hold the lisinopril for the acute renal failure. Will use IV medications p.r.n. Continue metoprolol.  5. Deep vein thrombosis prophylaxis: No heparin products, secondary to concern for hematoma/bleeding.   CODE STATUS: FULL CODE.   Time spent today on this case was 60 minutes.    ____________________________ Leia Alf Zylee Marchiano, MD srs:dm D: 06/06/2012 03:23:00 ET T: 06/06/2012 08:22:26 ET JOB#: [id number]  cc: Vianne Bulls. Arline Asp, MD Isaias Cowman, MD Ariellah Faust R. Ford Peddie, MD, <Dictator>  Neita Carp MD ELECTRONICALLY SIGNED 06/10/2012  15:24

## 2014-08-02 NOTE — Op Note (Signed)
PATIENT NAME:  [name], [name] MR#:  [id number] DATE OF BIRTH:  May 05, 1923  DATE OF PROCEDURE:  01/30/2013  REFERRING: Tana Conch. Wieting, MD  INDICATION:  1. Sinus pauses.  2. Atrial fibrillation.  3. Tachybrady syndrome.   PROCEDURE: Dual chamber pacemaker implantation.   POSTPROCEDURE DIAGNOSIS: Atrial sensing with ventricular pacing.   INDICATION: The patient is an 81 year old female who was admitted on 01/27/2013 with atrial fibrillation with a rapid ventricular rate. The patient was treated with intravenous diltiazem, was placed on oral diltiazem and continued on metoprolol, but then experienced 4-second pauses after converting to sinus rhythm. Procedure, risks, benefits and alternatives of dual-chamber pacemaker implantation were explained to the patient, and informed written consent was obtained.   DESCRIPTION OF PROCEDURE: She was brought to the Operating Room in a fasting state. The left pectoral region was prepped and draped in the usual sterile manner. Anesthesia was obtained with 1% Xylocaine locally. A 6 cm incision was performed over the left pectoral region. The pacemaker pocket was generated by electrocautery and blunt dissection. Access was obtained to the left subclavian vein by fine needle aspiration. Medtronic ventricular [phone]) and atrial (5592) leads were positioned to the right ventricular apical septum and right atrial appendage under fluoroscopic guidance. After proper thresholds were obtained, the leads were sutured in place. The pacemaker pocket was irrigated by gentamicin solution. The leads were connected to a dual chamber rate responsive pacemaker generator (Medtronic Adapta [id number]) and positioned into the pocket. The pocket was closed with 2-0 and 4-0 Vicryl, respectively. Steri-Strips and pressure dressing were applied.   ____________________________ Isaias Cowman, MD ap:lb D: 01/30/2013 13:31:29 ET T: 01/30/2013 13:37:46 ET JOB#: [id number]  cc: Isaias Cowman, MD, <Dictator> Isaias Cowman MD ELECTRONICALLY SIGNED 02/28/2013 9:31

## 2014-08-02 NOTE — Consult Note (Signed)
   General Aspect 81 yo female with history of hypertension, hyperlipidemia and carotid disease who was admitted after presenting to the er with complaints of chest pain. Had cardiac cath on 06/03/12 which revealed severe three vessel cad with 70% left main. CABG was recommended but pt deferred at the time. She had further chest pain at rest at home and presented for admission. She has ruled out for an mi and ekg does not reveal evidence of ischemia. She and her family have decided to proceed with surgery and prefer it to be done in [place].   Physical Exam:  GEN well developed, well nourished, no acute distress   HEENT PERRL, hearing intact to voice, bilateral carotid bruits   NECK supple  No masses   RESP normal resp effort  clear BS   CARD Regular rate and rhythm  Normal, S1, S2  Murmur   Murmur Systolic   Systolic Murmur axilla   ABD no hernia  normal BS   LYMPH negative neck, negative axillae   EXTR negative cyanosis/clubbing, negative edema   SKIN normal to palpation   NEURO cranial nerves intact, motor/sensory function intact   PSYCH A+O to time, place, person   Review of Systems:  Subjective/Chief Complaint chest pain   General: No Complaints   Skin: No Complaints   ENT: No Complaints   Eyes: No Complaints   Neck: No Complaints   Respiratory: No Complaints   Cardiovascular: Chest pain or discomfort  Tightness   Gastrointestinal: No Complaints   Genitourinary: No Complaints   Vascular: No Complaints   Musculoskeletal: No Complaints   Neurologic: No Complaints   Hematologic: No Complaints   Endocrine: No Complaints   Psychiatric: No Complaints   Review of Systems: All other systems were reviewed and found to be negative   Medications/Allergies Reviewed Medications/Allergies reviewed   EKG:  EKG NSR   Abnormal NSSTTW changes    Fosamax: N/V/Diarrhea  Plaquenil Sulfate: N/V/Diarrhea   Impression 81 yo female with hsitory of  hypertension, hyperlipidemia and recent diagnosis of three vewssel cad now admitted with progressive rest chest pain ono medical therapy. She has ruled out for mi. Has gall stones but no evidence of cholescystitis. Mild to moderately anemic. Has decided to undergo cabg.   Plan 1. Conitnue current meds 2. Consider carotid dopplers 3. Transfer to [facility] for consideration for cabg.   Electronic Signatures: Teodoro Spray (MD)  (Signed 25-Feb-14 11:46)  Authored: General Aspect/Present Illness, History and Physical Exam, Review of System, EKG , Allergies, Impression/Plan   Last Updated: 25-Feb-14 11:46 by Teodoro Spray (MD)

## 2014-08-02 NOTE — Discharge Summary (Signed)
PATIENT NAME:  [name], [name] MR#:  [id number] DATE OF BIRTH:  October 03, 1923  DATE OF ADMISSION:  01/28/2013 DATE OF DISCHARGE:  01/31/2013  PRESENTING COMPLAINT: Dizziness and not feeling well with palpitations.   DISCHARGE DIAGNOSES: 1.  Tachybrady syndrome status post pacemaker placement.  2.  Hypertension.   PROCEDURES: Pacemaker placement by Dr. Saralyn Pilar on 01/30/2013.   CODE STATUS: FULL CODE.   MEDICATIONS AT DISCHARGE: 1.  Eliquis 2.5 mg 1 tablet b.i.d.  2.  Diltiazem 240 mg extended release p.o. daily.  3.  Metoprolol 50 mg extended release daily.  4.  Levothyroxine 50 mcg 1 tablet p.o. daily.  5.  Hydroxychloroquine 200 mg p.o. daily.  6.  Alprazolam 0.25 mg 1/2 tablet 3 times a day as needed.  7.  Pravastatin 80 mg p.o. daily at bedtime.  8.  Restasis 0.05% ophthalmic drops 1 drop to affected eye b.i.d.  9.  Aspirin 325 mg p.o. daily.   NOTE: The patient advised to stop taking Norvasc.   DIET: Low sodium.   DISCHARGE FOLLOWUP:  1.  With Dr. Saralyn Pilar in 1 to 2 weeks. 2.  With Dr. Baldemar Lenis in 1 to 2 weeks at [facility], [place].  LABORATORY AND DIAGNOSTICS: EKG: At discharge, normal sinus rhythm. LVH with repolarization changes.   Chest x-ray: Pacemaker placement. No evidence of pneumothorax. Lungs are clear.   White count is 6.6, H and H is 10.7 and 31.1. Creatinine is 1.65 and BUN is 23. TSH is 0.976. Troponin is 0.03.   Echo Doppler showed EF of 55% to 60%. Normal global left ventricular systolic function. Mild mitral valve regurgitation, mild aortic regurgitation, moderate to severe aortic valve stenosis.   UA negative for UTI.  BRIEF SUMMARY OF HOSPITAL COURSE: [name] is an 81 year old Caucasian female with history of hypertension who comes in with not feeling well with palpitations.  1.  She was found to have A-fib with RVR which turned to tachybrady syndrome. She brady down into the 40s. She was initially started on Cardizem, however, it had to be held  because of bradycardia. She was seen by Dr. Saralyn Pilar. Given her tachybrady syndrome and her symptoms, she was recommended to get a pacemaker, which was placed 01/30/2013 without any complications. She was started on beta blockers, calcium channel blockers and Eliquis. The patient was explained the risks and complications with blood thinners. She and her daughter voiced understanding.  2.  Recent left lower extremity weakness, resolved.  3.  Coronary artery disease. Continued aspirin and beta blockers.  4.  Hypertension. The patient's blood pressure is currently acceptable at this point. Her beta blockers and calcium channel blockers were continued.  5.  Hypothyroidism. On Synthroid.  6.  CKD. The patient's creatinine remained stable at 1.65.   Hospital stay otherwise remained stable. The patient remained a FULL CODE.   TIME SPENT: 40 minutes. ____________________________ Hart Rochester Posey Pronto, MD sap:sb D: 02/01/2013 07:34:28 ET T: 02/01/2013 09:09:48 ET JOB#: [id number]  cc: Alivya Wegman A. Posey Pronto, MD, <Dictator> Derinda Late, MD Isaias Cowman, MD  Ilda Basset MD ELECTRONICALLY SIGNED 02/04/2013 10:55

## 2014-08-02 NOTE — Consult Note (Signed)
PATIENT NAME:  [name], [name] MR#:  [id number] DATE OF BIRTH:  25-Jun-1923  DATE OF CONSULTATION:  01/28/2013  REFERRING PHYSICIAN:   Loletha Grayer, MD  CONSULTING PHYSICIAN:  Isaias Cowman, MD  CHIEF COMPLAINT: "My heart was racing."   REASON FOR CONSULTATION: Consultation requested for evaluation of atrial fibrillation.   HISTORY OF PRESENT ILLNESS: The patient is an 81 year old female with known history of coronary artery disease. The patient recently underwent coronary artery bypass graft surgery 06/08/2012. The patient has experienced chronic left pleural effusion and has undergone thoracentesis. The patient has been attending heart track following surgery, with intermittent episodes of tachycardia. Earlier this week, the patient was seen by Dr. Baldemar Lenis, at which time. Metoprolol was decreased. She was seen in acute care clinic yesterday at which time amlodipine was also decreased. This morning, the patient felt her heart was racing, presented to [facility] Emergency Room where she was noted to be in atrial fibrillation with a rapid ventricular rate of 145 BPM. The patient was treated with a Cardizem bolus and drip and admitted to the CCU where she has converted to sinus rhythm with heart rates in the in the 70s. Troponin was negative.   PAST MEDICAL HISTORY: 1. Status post coronary artery bypass graft surgery 06/08/2012 with LIMA to LAD, SVG to OM1 and SVG to PDA.   2.  Hypertension.  3.  Hyperlipidemia.  4.  Chronic left pleural effusion.  5.  Connective tissue disease, followed by Dr. Cristi Loron.  6.  History of carotid disease.   MEDICATIONS ON ADMISSION: Aspirin 81 mg daily, metoprolol succinate 25 mg daily, pravastatin 80 mg at bedtime, Klor-Con 20 mEq daily, alprazolam 0.25 1/2 tablet t.i.d. p.r.n., calcium 600+ D 1 daily, levothyroxine 50 mcg daily,  sodium  2 teaspoon fulls  b.i.d.   SOCIAL HISTORY: The patient is a widow. She has one son. She lives at home. Her sister stays  with her at night.    FAMILY HISTORY: Mother with history of congestive heart failure.   REVIEW OF SYSTEMS:  CONSTITUTIONAL: No fever or chills.  EYES: No blurry vision.  EARS: No hearing loss.  RESPIRATORY: No shortness of breath.  CARDIOVASCULAR: Heart racing. No chest pain.  GASTROINTESTINAL: The patient has some mild nausea this morning.  GENITOURINARY: No dysuria or hematuria.  ENDOCRINE: No polyuria or polydipsia.  MUSCULOSKELETAL: No arthralgias or myalgias.  NEUROLOGICAL: No focal muscle weakness or numbness.  PSYCHOLOGICAL: No depression or anxiety.   PHYSICAL EXAMINATION: VITAL SIGNS: Blood pressure is 118/70, heart rate was 81 and regular.  HEENT: Pupils equal, reactive to light and accommodation.  NECK: Supple without thyromegaly.  LUNGS: Clear.  HEART: Normal JVP. Normal PMI. Regular rate and rhythm. Normal S1, S2. No appreciable gallop, murmur, or rub.  ABDOMEN: Soft and nontender. Pulses were intact bilaterally.  MUSCULOSKELETAL: Normal muscle tone.  NEUROLOGIC: The patient is alert and oriented x 3. Motor and sensory both grossly intact.   IMPRESSION: This is an 81 year old female with known coronary artery disease, status post recent bypass graft surgery, who presents with atrial fibrillation with a rapid ventricular rate which has responded to diltiazem bolus and drip and has converted to sinus rhythm.   RECOMMENDATIONS: 1.  Agree with overall current therapy.  2.  Continue metoprolol.  3.  Agree with starting diltiazem 60 mg every 6 hours. 4.  Taper diltiazem drip to off.  5. The patient has a CHADS2 Score of 2. The patient would benefit from being on chronic anticoagulation. Discussed  her choices of warfarin versus the new novel anticoagulants. Based on her advanced age, low weight and creatinine of 1.6, we will start Eliquis 2.5 mg b.i.d. I would stop aspirin after two doses of Eliquis.   ____________________________ Isaias Cowman,  MD ap:cc D: 01/28/2013 14:28:58 ET T: 01/28/2013 18:19:09 ET JOB#: [id number]  cc: Isaias Cowman, MD, <Dictator> Isaias Cowman MD ELECTRONICALLY SIGNED 02/28/2013 9:31

## 2014-08-02 NOTE — H&P (Signed)
PATIENT NAME:  [name], [name] MR#:  [id number] DATE OF BIRTH:  1923/04/25  DATE OF ADMISSION:  01/28/2013  CHIEF COMPLAINT: Not feeling well and palpitations.   HISTORY OF PRESENT ILLNESS: This is an 81 year old female who states that she has not been feeling well for the past week. Today, she got up, she felt like she was going to pass out and sat down on the couch. Her heart was beating really fast. As per the patient, Dr. Saralyn Pilar recently put her back on her Toprol but on her last appointment with Dr. Baldemar Lenis they cut back her blood pressure medications in half. On Tuesday, she did drag her left foot and had some numbness in the left hand and leg but she feels stronger with regards to her left-sided strength. In the ER, she was found to be in rapid atrial fibrillation, received a diltiazem push and was started on diltiazem drip. She was fluctuating back from normal sinus to atrial fibrillation when I saw her. Hospitalist services were contacted for further evaluation.   PAST MEDICAL HISTORY: Coronary artery disease, hypertension, hyperlipidemia, hypothyroidism, lupus of the skin.   PAST SURGICAL HISTORY: CABG back in March.   ALLERGIES: LISTED IN THE COMPUTER AS PLAQUENIL SULFATE AND FOSAMAX.   MEDICATIONS: Include pravastatin 80 mg 1/2 tablet at bedtime, Toprol-XL 25 mg 1/2 tablet daily, Xanax 0.25 mg 1/2 tablet 3 times a day as needed for anxiety, Norvasc 10 mg 1/2 tablet daily, levothyroxine 50 mcg daily, hydroxychloroquine 200 mg I think she takes that every other day, aspirin 81 mg daily and a stool softener.   SOCIAL HISTORY: She lives alone, used to work at a [employer]. No smoking, alcohol or drug use.   FAMILY HISTORY: Father died of cancer of the liver. Her mother died in her 53s of heart disease.   REVIEW OF SYSTEMS: CONSTITUTIONAL: Positive for weight loss. No fever, chills or sweats. Positive for weakness recently left leg.  EYES: No blurry vision.  EARS, NOSE, MOUTH AND THROAT: No  sore throat. No difficulty swallowing. No difficulty hearing.  CARDIOVASCULAR: Positive for palpitations and feeling her heart flipping.  RESPIRATORY: No shortness of breath. No coughing. No sputum. No hemoptysis.  GASTROINTESTINAL: No nausea. No vomiting. No abdominal pain. No diarrhea. No constipation. No bright red blood per rectum. No melena.  GENITOURINARY: No burning on urination. No hematuria.  MUSCULOSKELETAL: No joint pain or muscle pain.  INTEGUMENT: No rashes or eruptions.  NEUROLOGIC:  The patient felt faint this morning.  PSYCHIATRIC: No anxiety or depression.  ENDOCRINE: Positive for hypothyroidism.  HEMATOLOGIC AND LYMPHATIC: No anemia.   PHYSICAL EXAMINATION:  VITAL SIGNS:  On presentation, heart rate registered at 150, temperature 98.6, blood pressure 107/67, respirations 28, pulse ox 99% on room air.  GENERAL: No respiratory distress.  EYES: Conjunctivae and lids normal. Pupils equal, round and reactive to light. Extraocular muscles intact. No nystagmus.  EARS, NOSE, MOUTH AND THROAT: Tympanic membranes: No erythema. Nasal mucosa: No erythema. Throat: No erythema. No exudate seen. Lips and gums: No lesions.  NECK: No JVD. No bruits. No lymphadenopathy. No thyromegaly. No thyroid nodules palpated.  LUNGS: Clear to auscultation. No use of accessory muscles to breathe. No rhonchi, rales or wheeze heard.  CARDIOVASCULAR: S1, S2, irregularly irregular. No gallops, rubs or murmurs heard. Carotid upstroke 2+ bilaterally. No bruits. Dorsalis pedis pulses 2+ bilaterally. No edema of the lower extremity.  ABDOMEN: Soft, nontender. No organomegaly/splenomegaly megaly. Normoactive bowel sounds. No masses felt.  LYMPHATIC: No lymph nodes  in the neck.  MUSCULOSKELETAL: No clubbing, edema or cyanosis.  SKIN: No rashes or ulcers seen.  NEUROLOGIC: Cranial nerves II through XII grossly intact. Deep tendon reflexes 2+ bilateral lower extremities. Power 5 out of 5 bilateral upper and lower  extremities. Babinski negative.  PSYCHIATRIC: The patient is oriented to person, place and time.   LABORATORY AND RADIOLOGICAL DATA: TSH 0.68. Troponin negative. Glucose 101, BUN 20, creatinine 1.60, sodium 140, potassium 3.4, chloride 109, CO2 21, calcium 8.7. White blood cell count 6.5, H and H 12.4 and 36.6, platelet count 181. CT scan of the head showed no acute intracranial abnormality. Chest x-ray showed no acute abnormality. Looking back at old labs, the patient's last creatinine was 1.56, making this chronic kidney disease. EKG showed atrial fibrillation, 145 beats per minute   ASSESSMENT AND PLAN: 1.  Atrial fibrillation with rapid ventricular response. The patient was given IV diltiazem and started on Cardizem drip. I will start oral Cardizem also, admit to the Critical Care Unit. I did speak with Dr. Saralyn Pilar, cardiology, who will see the patient and I will order an echocardiogram. Looking at the patient's CHADSVASC2 score it is elevated with the patient's age, hypertension, possible recent transient ischemic attack, female age and prior myocardial infarction. Will benefit from anticoagulation, probably with 1 of the newer agents, but I will get an MRI of the brain since the patient recently had the left lower extremity weakness.  2.  Recent left lower extremity weakness. We will get a MRI of the brain to rule out cerebrovascular accident. I am hesitant on starting anticoagulation right at this point in time but may benefit from 1 of the newer anticoagulation agents.  3.  Coronary artery disease. Continue aspirin and low-dose metoprolol. No complaints of chest pain at this time.  4.  Hypertension. Blood pressure is currently acceptable at this point.  5.  Hyperlipidemia. We will check a lipid profile in the a.m. The patient is on pravastatin, will continue.  6.  Hypothyroidism. Continue levothyroxine.  7.  Chronic kidney disease. Watch currently while here.  8.  Hypokalemia. Will give a  potassium chloride IV fluid bolus and check a magnesium.   TIME SPENT ON ADMISSION: 55 minutes.    ____________________________ Tana Conch. Leslye Peer, MD rjw:cs D: 01/28/2013 13:55:00 ET T: 01/28/2013 15:05:50 ET JOB#: [id number]  cc: Tana Conch. Leslye Peer, MD, <Dictator> Marisue Brooklyn MD ELECTRONICALLY SIGNED 01/29/2013 10:19

## 2014-08-03 NOTE — H&P (Signed)
PATIENT NAME:  [name], [name] MR#:  [id number] DATE OF BIRTH:  08/31/1923  DATE OF ADMISSION:  08/14/2013  PRIMARY CARE PHYSICIAN: Dr. Apolonio Schneiders.   REFERRING PHYSICIAN: Dr. Marta Antu.   CHIEF COMPLAINT: Chest pain.   HISTORY OF PRESENT ILLNESS: The patient is an 81 year old, pleasant, white female with past medical history of coronary artery disease, status post coronary artery bypass grafting one year back, tachybrady syndrome, status post pacemaker placement about six months back, who comes to the Emergency Department with complaints of chest pain. The patient felt indigestion with nausea. The patient took a her Pepcid. After some time, the symptoms were resolved; however, started to have pain at the pacemaker site, which is chronic for her. The patient was also noted to have systolic blood pressure in the 170s. Concerning this, the patient is brought to the Emergency Department. Work-up in the Emergency Department with EKG did not show any ST-T wave abnormalities. Her initial set of cardiac enzymes are negative. The patient currently denies having any pain.   PAST MEDICAL HISTORY: 1.  Coronary artery disease.  2.  Hypertension.  3.  Hyperlipidemia.  4.  Hypothyroidism.  5.  Lupus.  6.  Tachybrady syndrome, status post pacemaker placement.  7.  Coronary artery disease status post coronary artery bypass grafting.    ALLERGIES: PLAQUENIL AND FOSAMAX.   HOME MEDICATIONS: 1.  Restasis 0.05% to each affected.  2.  Pravastatin 80 mg once a day. 3.  Metoprolol 50 mg once a day.  4.  Levothyroxine 50 mcg once a day.  5.  Hydroxychloroquine 200 mg 1 tablet once a day.  6.  Diltiazem 240 mg once a day.  7.  Aspirin 325 mg once a day.  8.  Eliquis 2.5 mg 2 times a day.  9.  Alprazolam 0.5 mg 3 times a day as needed.      SOCIAL HISTORY: No history of smoking, drinking alcohol or using illicit drugs. Lives by herself. Independent of activities of daily living and IADLs.   FAMILY HISTORY:  Father died of liver cancer. Mother died in her 46s from heart disease.   REVIEW OF SYSTEMS:  CONSTITUTIONAL: Denies any generalized weakness.  EYES: No change in vision.  ENT: No change in hearing.  RESPIRATORY: No cough, shortness of breath.  CARDIOVASCULAR: Experiences some chest pain, chronically at the pacemaker site. No pedal edema.  GASTROINTESTINAL: No nausea, vomiting, abdominal pain.  GENITOURINARY: No dysuria or hematuria.  ENDOCRINE: No polyuria or polydipsia.  HEMATOLOGIC: No easy bruising or bleeding.  SKIN: No rash or lesions.  MUSCULOSKELETAL: No joint pains and aches.  NEUROLOGIC: No weakness or numbness in any part of the body.   PHYSICAL EXAMINATION: GENERAL: This is a thin-built, well-nourished female, lying down in the bed, not in distress.  VITAL SIGNS: Temperature 98, pulse 77, blood pressure 119/44, respiratory rate of 16, oxygen saturation 96% on room air.  HEENT: Head normocephalic, atraumatic. No scleral icterus. Conjunctivae normal. Pupils equal and react to light. Extraocular movements are intact. Mucous membranes moist. No pharyngeal erythema.  NECK: Supple. No lymphadenopathy. No JVD. No carotid bruit. No thyromegaly.  CHEST: Has focal tenderness on the pacemaker site. LUNGS: Bilaterally clear to auscultation.  HEART: S1, S2. Regular. Has pericardial rub as well as systolic murmur. No pedal edema. Pulses 2+.  ABDOMEN: Bowel sounds present. Soft, nontender, nondistended. Could not appreciate any hepatosplenomegaly.  SKIN: No rash or lesions.  MUSCULOSKELETAL: Good range of motion in all the extremities.  NEUROLOGIC: The  patient is alert, oriented to place, person, and time. Cranial nerves II through XII intact. Motor 5/5 in upper and lower extremities.   LABORATORY DATA: CBC: WBC of 6.1, hemoglobin 10.1. BMP is BUN 29, creatinine of 1.78. The rest of the values are within normal limits. Chest x-ray, one view portable: No acute cardiopulmonary disease.    ASSESSMENT AND PLAN: The patient is an 81 year old female who comes to the Emergency Department with complaints of chest pain.   1.  Chest pain. The patient had coronary artery bypass grafting one year back. The patient, at baseline, well functional. Per patient and family, after the bypass grafting, the patient had significant improvement in functional status. The patient participated in exercise program yesterday without any chest pain or shortness of breath. Most likely gastrointestinal cause with pain from the local pacemaker placement. Initial set of cardiac enzymes is negative. Considering the patient's risk factors, will cycle two more sets cardiac enzymes. If negative, the patient could be discharged home.  2.  Hypertension. Currently well controlled. This could be from anxiety. Continue with home medications of diltiazem, metoprolol.  3.  Chronic renal insufficiency. Seems to be at baseline.  4.  Keep the patient on deep vein thrombosis prophylaxis with Lovenox.   TIME SPENT: 45 minutes.     ____________________________ Monica Becton, MD pv:cg D: 08/14/2013 03:49:36 ET T: 08/14/2013 07:28:11 ET JOB#: [id number]  cc: Monica Becton, MD, <Dictator> Vianne Bulls. Arline Asp, MD Grier Mitts Amram Maya MD ELECTRONICALLY SIGNED 08/15/2013 4:28

## 2014-08-03 NOTE — Discharge Summary (Signed)
PATIENT NAME:  [name], JOB MR#:  [id number] DATE OF BIRTH:  [birth year]  DATE OF ADMISSION:  08/14/2013 DATE OF DISCHARGE:  08/14/2013  Addendum.  This is a continuation.  Follow up with Dr. Saralyn Pilar on your scheduled appointment.  Follow up with your primary care physician in 1 to 2 weeks.  Continue cardiac care rehab as per your routine.  IMAGING AND LABORATORY DATA:  Cardiac enzymes x 3 negative.   Chest x-ray:  No acute cardiopulmonary abnormality.  H and H are 10.1 and 30.6.  White count is 6.1.  Creatinine 1.78.  EKG showed sinus rhythm with possible left atrial enlargement.  No acute ST-T abnormality.  HOSPITAL COURSE:  [name] is a pleasant 81 year old Caucasian  female with history of coronary artery disease, underwent CABG last year, about a year ago, comes to the emergency room after she had some complaints of chest pain.  She reports having heavy meal last night, started having some burning  pain, took some Pepcid AC at home.  She came in, was admitted with: 1.  Chest pain.  She was ruled out with 3 sets of negative cardiac enzymes.  She remained hemodynamically stable without any EKG changes of acute ST elevation or depression.  Chest pain appears likely due to some GERD or gastric indigestion.  The patient currently is chest pain free.  Her symptoms have resolved.  She will continue her cardiac meds.  She is asked to take Pepcid as p.r.n.  2.  Hypertension.  Continue metoprolol and diltiazem. 3.  Chronic kidney disease stage III, seems to be at baseline. 4.  Deep vein thrombosis prophylaxis.  The patient is on Eliquis. 5.  History of atrial fib in the past, on rate blocking agent and on Eliquis.  She has history of pacemaker and has followup appointment with Dr. Saralyn Pilar next week, which she is advised to keep.  Hospital stay otherwise remained stable.    The patient remained a FULL CODE.  TIME SPENT:  40 minutes.    ____________________________ Hart Rochester Posey Pronto,  MD sap:dmm D: 08/14/2013 14:16:00 ET T: 08/14/2013 22:59:03 ET JOB#: [id number]  cc: Siona Coulston A. Posey Pronto, MD, <Dictator> Isaias Cowman, MD Ilda Basset MD ELECTRONICALLY SIGNED 09/02/2013 16:14

## 2014-08-03 NOTE — Discharge Summary (Signed)
PATIENT NAME:  [name], [name] MR#:  [id number] DATE OF BIRTH:  [birth year]  DATE OF ADMISSION:   DATE OF DISCHARGE:    PRESENTING COMPLAINT: Chest pain.   DISCHARGE DIAGNOSES: 1.  Chest pain, appears atypical, likely due to indigestion/acid reflux.  2.  History of coronary artery disease status post coronary artery bypass graft in the past.  3.  History of lupus.  4.  Chronic atrial fib in the past.  5.  History of pacemaker placement.   CONDITION ON DISCHARGE: Fair.   Cardiac enzymes x 3 negative.   CODE STATUS: FULL CODE.  DISCHARGE MEDICATIONS: 1.  Pravastatin 80 mg daily at bedtime.  2.  Restasis 0.5% ophthalmic drop to affected eye b.i.d.  3.  Alprazolam 0.25, 1/2 tablet 3 times a day as needed.  4.  Hydroxychloroquine 200 mg p.o. daily.  5.  Levothyroxine 50 mcg p.o. daily.  6.  Metoprolol 50 mg daily.  7.  Diltiazem CD 240 p.o. daily.  8.  Apixaban 2.5 mg b.i.d.   DICTATION ENDS HERE.  ____________________________ Hart Rochester. Posey Pronto, MD sap:dmm D: 08/14/2013 14:11:00 ET T: 08/14/2013 22:54:34 ET JOB#: [id number]  cc: Antonea Gaut A. Posey Pronto, MD, <Dictator> Ilda Basset MD ELECTRONICALLY SIGNED 09/02/2013 16:14

## 2014-08-04 NOTE — H&P (Signed)
PATIENT NAME:  [name], [name] MR#:  [id number] DATE OF BIRTH:  [birth year]  DATE OF ADMISSION:  03/31/2011  PRIMARY CARE PHYSICIAN: Apolonio Schneiders, MD    CHIEF COMPLAINT: Chest pain.   HISTORY OF PRESENT ILLNESS: The patient is an 81 year old female who presents with chest pain across the top of her chest. This morning she woke up with the pain. It seems to come and go. She cannot describe it, but something was not right. It was described as a 3 to 4 out of 10 in intensity pain, but she cannot describe the type of pain. She took two aspirin. She chewed some Rolaids. She thought it was indigestion and took some Pepto-Bismol, then she went on to do some cleaning of the house and went out to lunch.  The pain returned, and she called her son; they took her over to Urgent Care, and they sent her over to the Emergency Room for further evaluation. No complaints of shortness of breath or sweating or nausea with this. The pain lasted 5 minutes at maximum. She recently started Plaquenil in November and thinks it may be secondary to that. She normally walks 2 miles per day without any chest pain or shortness of breath. In the Emergency Room, she was found to have high blood pressure with a blood pressure systolic greater than 315. Hospitalist Services were contacted for further evaluation.   PAST MEDICAL HISTORY:  1. Hypertension. 2. Hyperlipidemia. 3. Anxiety. 4. Arthritis.  5. Skin lupus.   PAST SURGICAL HISTORY: None.   ALLERGIES: No known drug allergies.   MEDICATIONS:  1. Norvasc 5 mg at bedtime.  2. Aspirin, 2 baby aspirin at night. 3. Pravastatin 40 mg at bedtime.  4. Xanax p.r.n.  5. Plaquenil 200 mg daily.   SOCIAL HISTORY: No smoking. No alcohol. No drug use. She lives alone. She used to work at a [employer].   FAMILY HISTORY: Father died of liver cancer. Mother died of congestive heart failure.   REVIEW OF SYSTEMS: CONSTITUTIONAL: No fever, chills, or sweats. No weight loss. Positive for weight  gain, a few pounds. No weakness or fatigue. EYES: No eye pain or eye strain. EARS, NOSE, MOUTH, AND THROAT: No hearing loss. No sore throat. No difficulty swallowing. CARDIOVASCULAR: Positive for chest pain. No palpitations. RESPIRATORY: No shortness of breath. No cough, no sputum. No hemoptysis. GASTROINTESTINAL: No nausea. No vomiting. No diarrhea. Positive for constipation, no abdominal pain. No bright red blood per rectum. No melena. GENITOURINARY: No burning on urination. No hematuria. MUSCULOSKELETAL: Positive for arthritis pain. INTEGUMENT: No other issues besides the skin lupus. NEUROLOGICAL: No fainting or blackouts. PSYCHIATRIC: Positive for anxiety. No depression. ENDOCRINE: No thyroid problems. HEMATOLOGIC/LYMPHATIC: No anemia. No easy bruising or bleeding.   PHYSICAL EXAMINATION:  VITAL SIGNS: Temperature 98.4, pulse 117, respirations 20, blood pressure 194/76, pulse oximetry 99%.   GENERAL: No respiratory distress.   HEENT: Eyes: Conjunctivae and lids are normal. Pupils are equal, round, and reactive to light. Extraocular muscles are intact. No nystagmus. Ears, nose, mouth, and throat: Tympanic membranes no erythema. Nasal mucosa no erythema. Throat no erythema. No exudate seen. Lips and gums normal.   NECK: No JVD. No bruits. No lymphadenopathy. No thyromegaly. No thyroid nodules palpated.   LUNGS: Lungs are clear to auscultation. No use of accessory muscles to breathe. No rhonchi, rales, or wheeze heard.   HEART: S1, S2 normal. A positive 2 out of 6 systolic ejection murmur. Carotid upstroke 2+ bilaterally. No bruits.   EXTREMITIES: Dorsalis  pedis pulses 2+ bilaterally. No edema of the lower extremity.   ABDOMEN: Soft, nontender. No organomegaly/splenomegaly. Normoactive bowel sounds. No masses felt.   LYMPHATIC: No lymph nodes in the neck.   MUSCULOSKELETAL: No clubbing, edema, or cyanosis.   SKIN: No ulcers seen.   NEUROLOGICAL: Cranial nerves II through XII are grossly  intact. Deep tendon reflexes are 2+ bilateral lower extremities.   PSYCHIATRIC: The patient is oriented to person, place, and time.   LABORATORY, DIAGNOSTIC AND RADIOLOGICAL DATA:  White blood cell count 6.3, hemoglobin and hematocrit 11.8 and 35.9, platelet count of 159. Glucose 101, BUN 19, creatinine 1.03, sodium 140, potassium 3.4, chloride 106, CO2 23, calcium 8.6.  Liver function tests: AST slightly elevated at 39.  Troponin negative.  EKG: Sinus tachycardia at 105 beats per minute, Q waves septally.   ASSESSMENT AND PLAN:  1. Malignant hypertension: I will give metoprolol 25 mg b.i.d., Norvasc 5 mg at bedtime. Continue to monitor closely. I think anxiety may play a role in this.  2. Chest pain: The patient is currently asymptomatic. The pain is atypical. The patient thinks it may be related to her Plaquenil. We will hold off on the Plaquenil at this point. We will admit to Telemetry, get serial cardiac enzymes to rule out myocardial infarction. We will get a stress test in the a.m. if enzymes are negative. We will continue aspirin, metoprolol and Lovenox injections. 3. Hyperlipidemia: Continue pravastatin. Check a cholesterol profile in the a.m.  4. Anemia: Hemoglobin of 11.8. Back in 2010 it was 12.9.  5. Hypokalemia: Replace potassium and check a magnesium in the a.m.  6. Lupus of the skin: We will hold on Plaquenil since the patient thinks her symptoms may be secondary to that.   CODE STATUS: The patient is a FULL CODE.  TIME SPENT ON ADMISSION: 55 minutes.   ____________________________ Tana Conch. Leslye Peer, MD rjw:cbb D: 03/31/2011 18:08:38 ET T: 03/31/2011 19:19:39 ET JOB#: [id number]  cc: Tana Conch. Leslye Peer, MD, <Dictator> Vianne Bulls. Arline Asp, [place] MD ELECTRONICALLY SIGNED 04/28/2011 15:54

## 2014-08-07 DIAGNOSIS — N183 Chronic kidney disease, stage 3 (moderate): Secondary | ICD-10-CM | POA: Diagnosis not present

## 2014-08-07 DIAGNOSIS — D649 Anemia, unspecified: Secondary | ICD-10-CM | POA: Diagnosis not present

## 2014-08-07 DIAGNOSIS — R5382 Chronic fatigue, unspecified: Secondary | ICD-10-CM | POA: Diagnosis not present

## 2014-08-07 DIAGNOSIS — I480 Paroxysmal atrial fibrillation: Secondary | ICD-10-CM | POA: Diagnosis not present

## 2014-09-19 DIAGNOSIS — I25810 Atherosclerosis of coronary artery bypass graft(s) without angina pectoris: Secondary | ICD-10-CM | POA: Diagnosis not present

## 2014-09-19 DIAGNOSIS — I480 Paroxysmal atrial fibrillation: Secondary | ICD-10-CM | POA: Diagnosis not present

## 2014-09-19 DIAGNOSIS — I10 Essential (primary) hypertension: Secondary | ICD-10-CM | POA: Diagnosis not present

## 2014-09-19 DIAGNOSIS — Z8679 Personal history of other diseases of the circulatory system: Secondary | ICD-10-CM | POA: Diagnosis not present

## 2014-10-02 DIAGNOSIS — D638 Anemia in other chronic diseases classified elsewhere: Secondary | ICD-10-CM | POA: Diagnosis not present

## 2014-10-02 DIAGNOSIS — I480 Paroxysmal atrial fibrillation: Secondary | ICD-10-CM | POA: Diagnosis not present

## 2014-10-02 DIAGNOSIS — N183 Chronic kidney disease, stage 3 (moderate): Secondary | ICD-10-CM | POA: Diagnosis not present

## 2014-10-02 DIAGNOSIS — I10 Essential (primary) hypertension: Secondary | ICD-10-CM | POA: Diagnosis not present

## 2014-10-09 DIAGNOSIS — E782 Mixed hyperlipidemia: Secondary | ICD-10-CM | POA: Diagnosis not present

## 2014-10-09 DIAGNOSIS — Z79899 Other long term (current) drug therapy: Secondary | ICD-10-CM | POA: Diagnosis not present

## 2014-10-09 DIAGNOSIS — D638 Anemia in other chronic diseases classified elsewhere: Secondary | ICD-10-CM | POA: Diagnosis not present

## 2014-10-09 DIAGNOSIS — E538 Deficiency of other specified B group vitamins: Secondary | ICD-10-CM | POA: Diagnosis not present

## 2014-11-14 IMAGING — CR DG CHEST 2V SAME DAY
2 series · 2 of 2 positions shown · non-contrast
Comparison: 08/03/2012

CLINICAL DATA: Post thoracentesis today

CHEST - 2 VIEW SAME DAY

[w chest pa (1 of 2)]
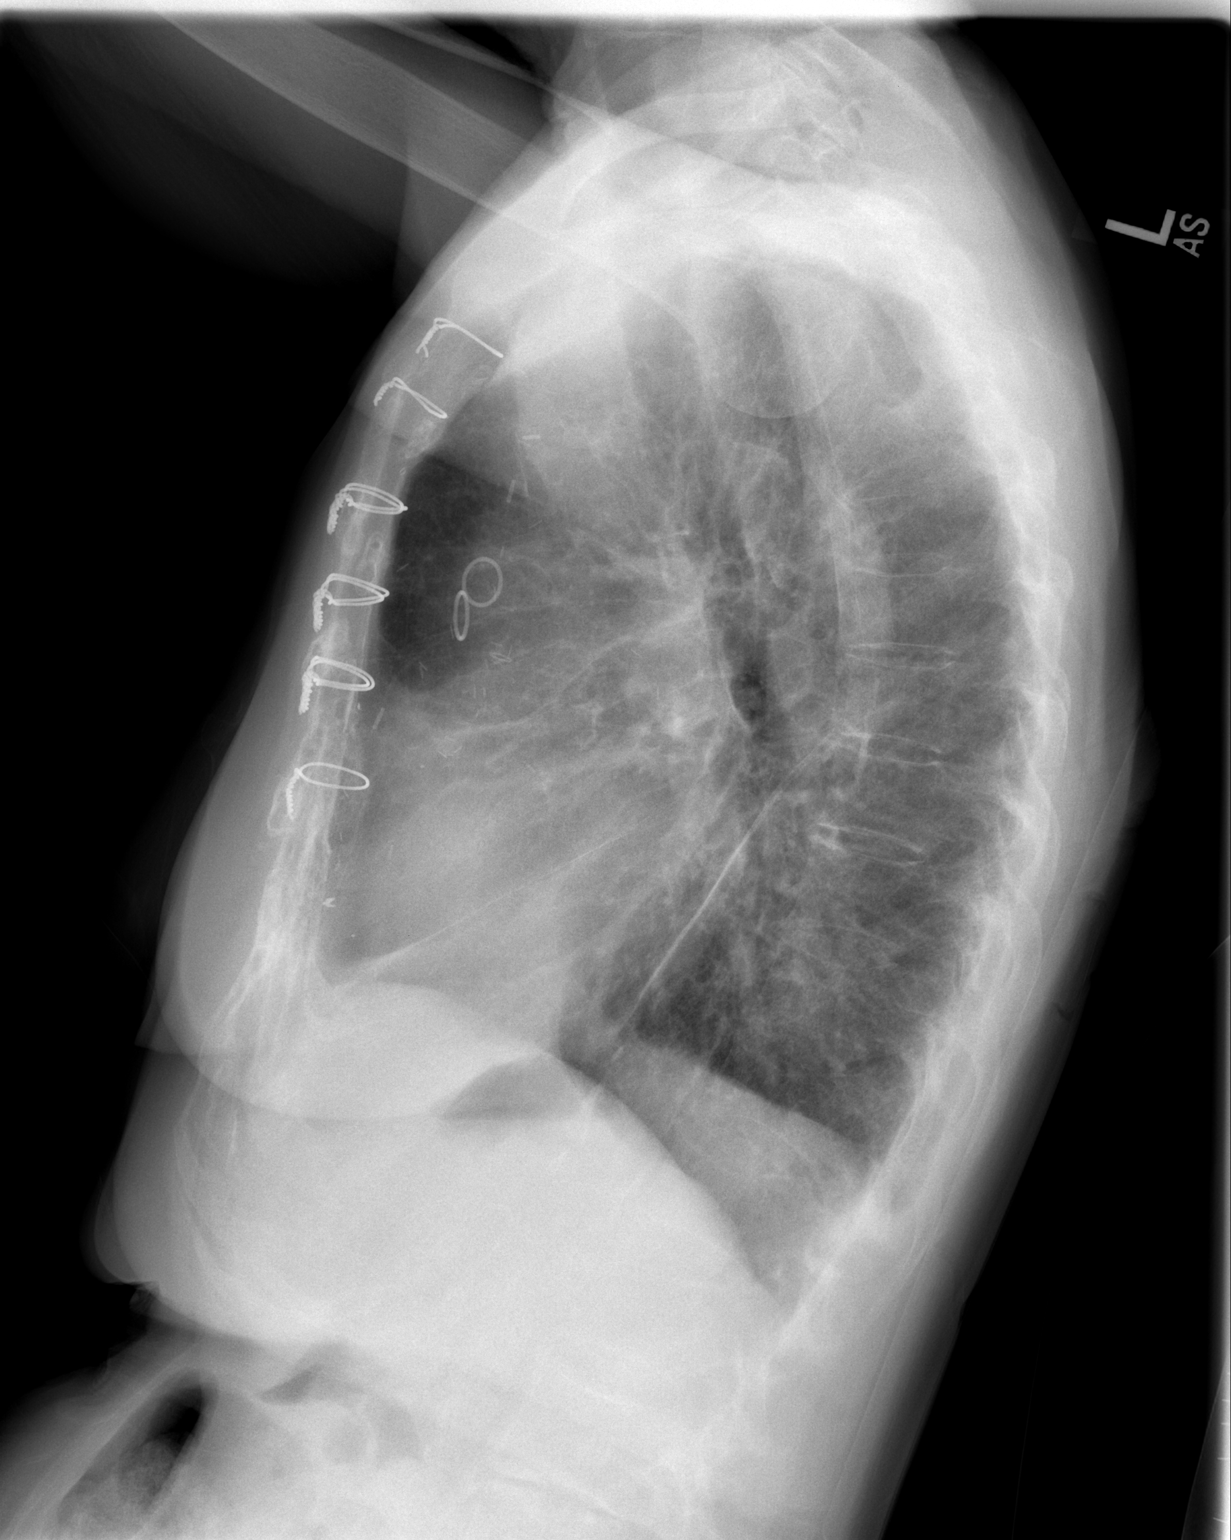

[w chest pa (2 of 2)]
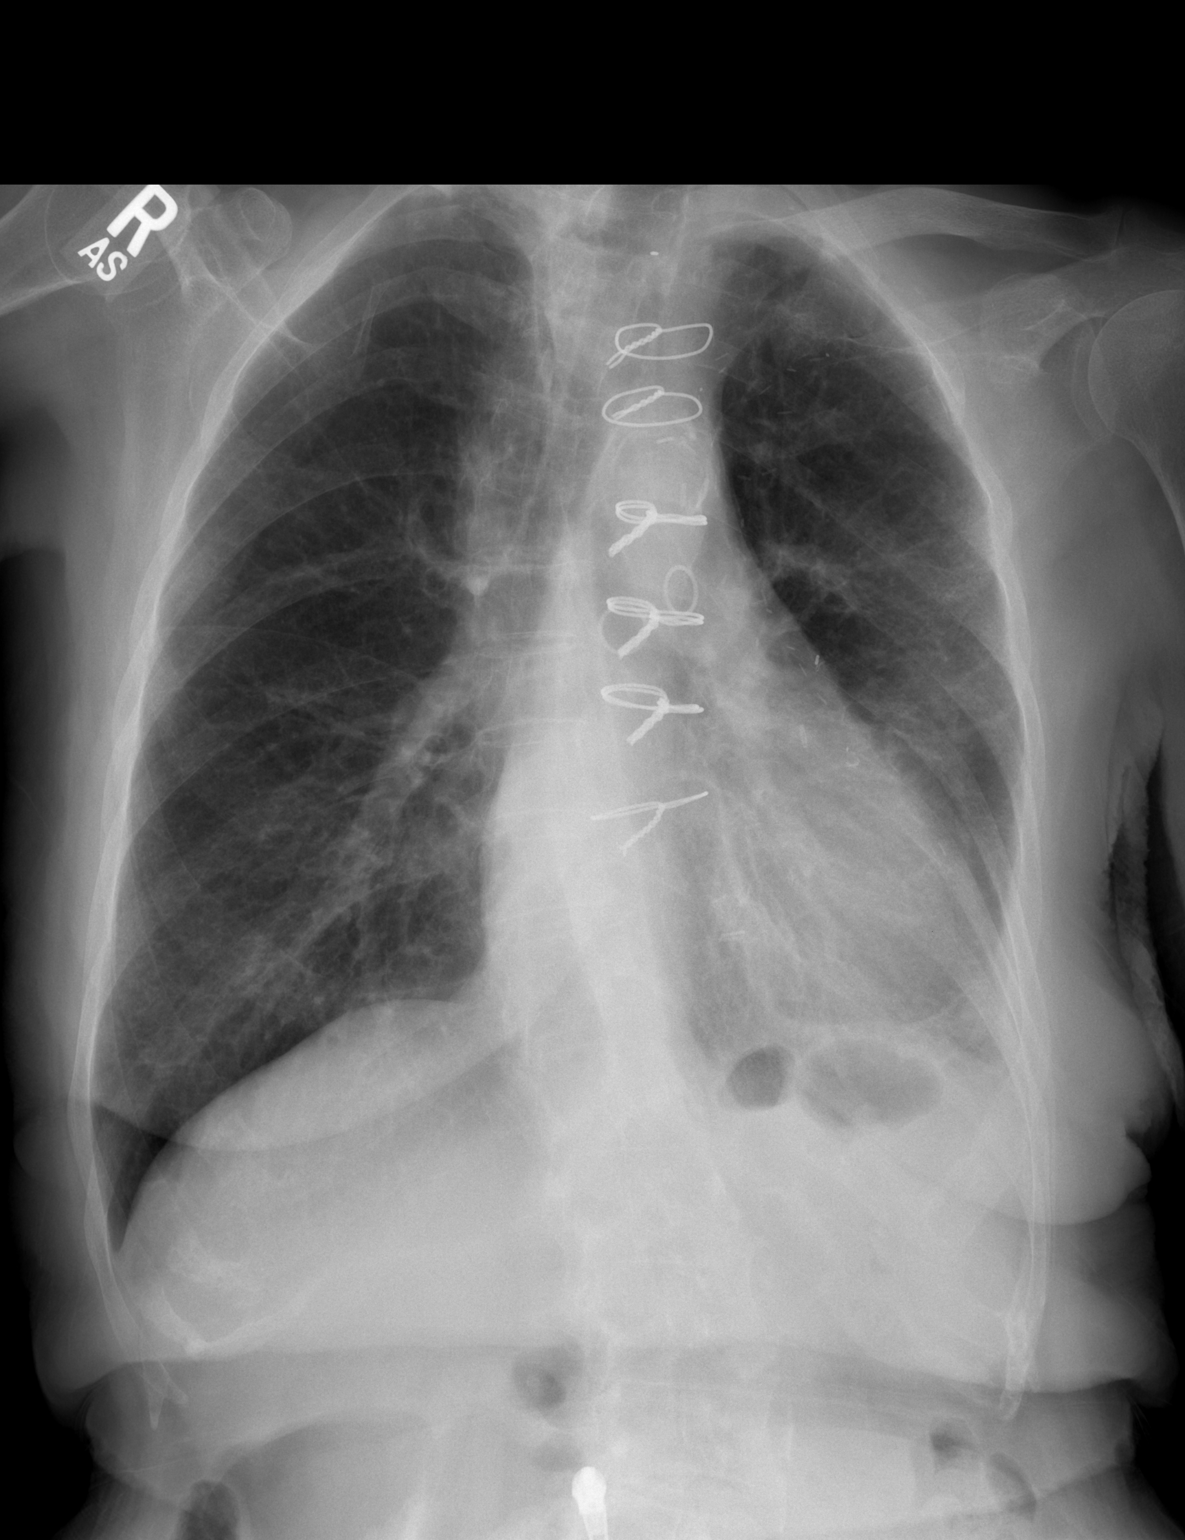

[2 of 2 positions shown; findings below may reference images not displayed]

FINDINGS: Marked improvement in left effusion which has  nearly
completely resolved following thoracentesis.  No pneumothorax.

COPD.  Negative for heart failure.  Right lung is clear.
IMPRESSION: Near complete evacuation of left pleural effusion.  No pneumothorax
post thoracentesis.

## 2014-11-26 IMAGING — CR DG CHEST 2V
2 series · 2 of 2 positions shown · non-contrast
Comparison: [DATE] and [DATE] and 07/04/2012

CLINICAL DATA: Shortness of breath.  Coronary artery disease.  Left
pleural effusion.

CHEST - 2 VIEW

[w chest lat]
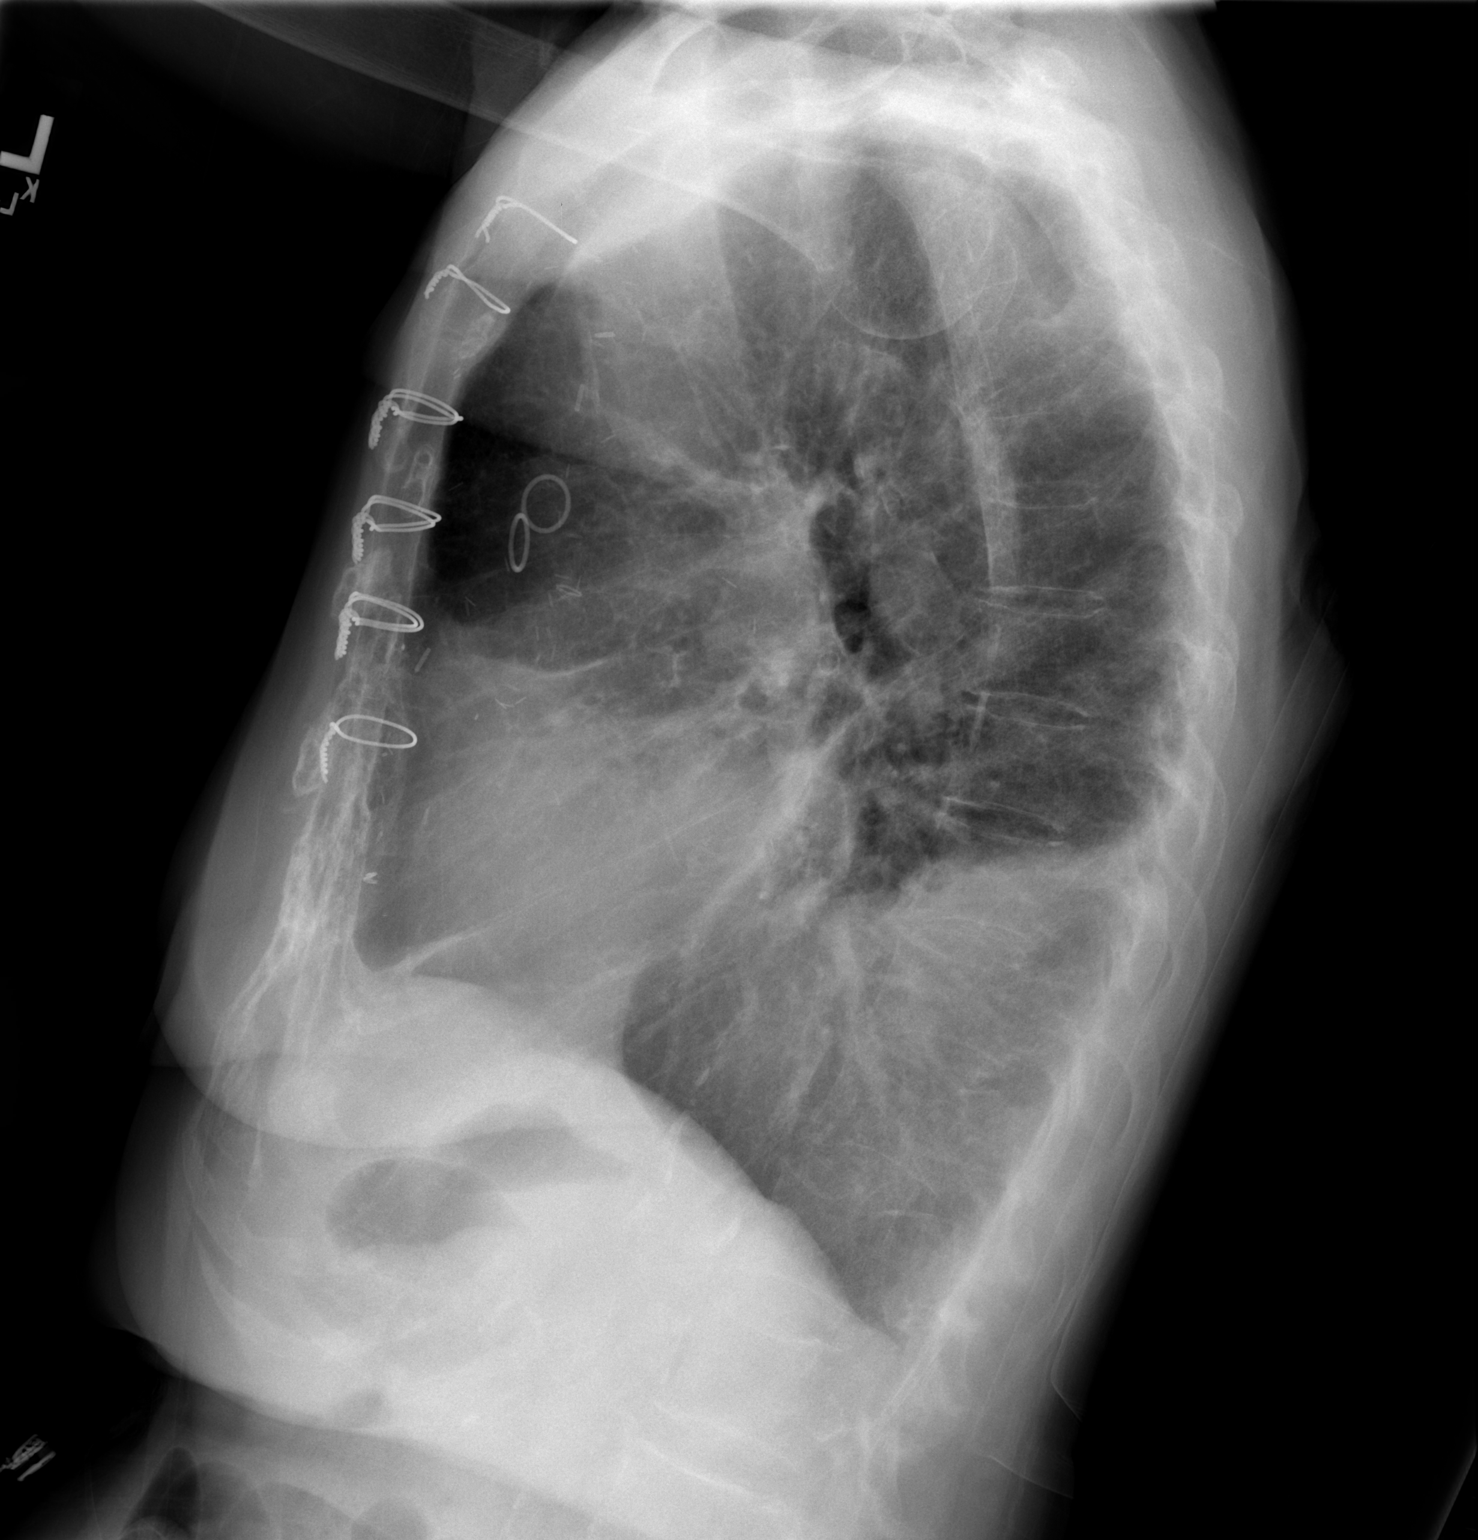

[w chest ap]
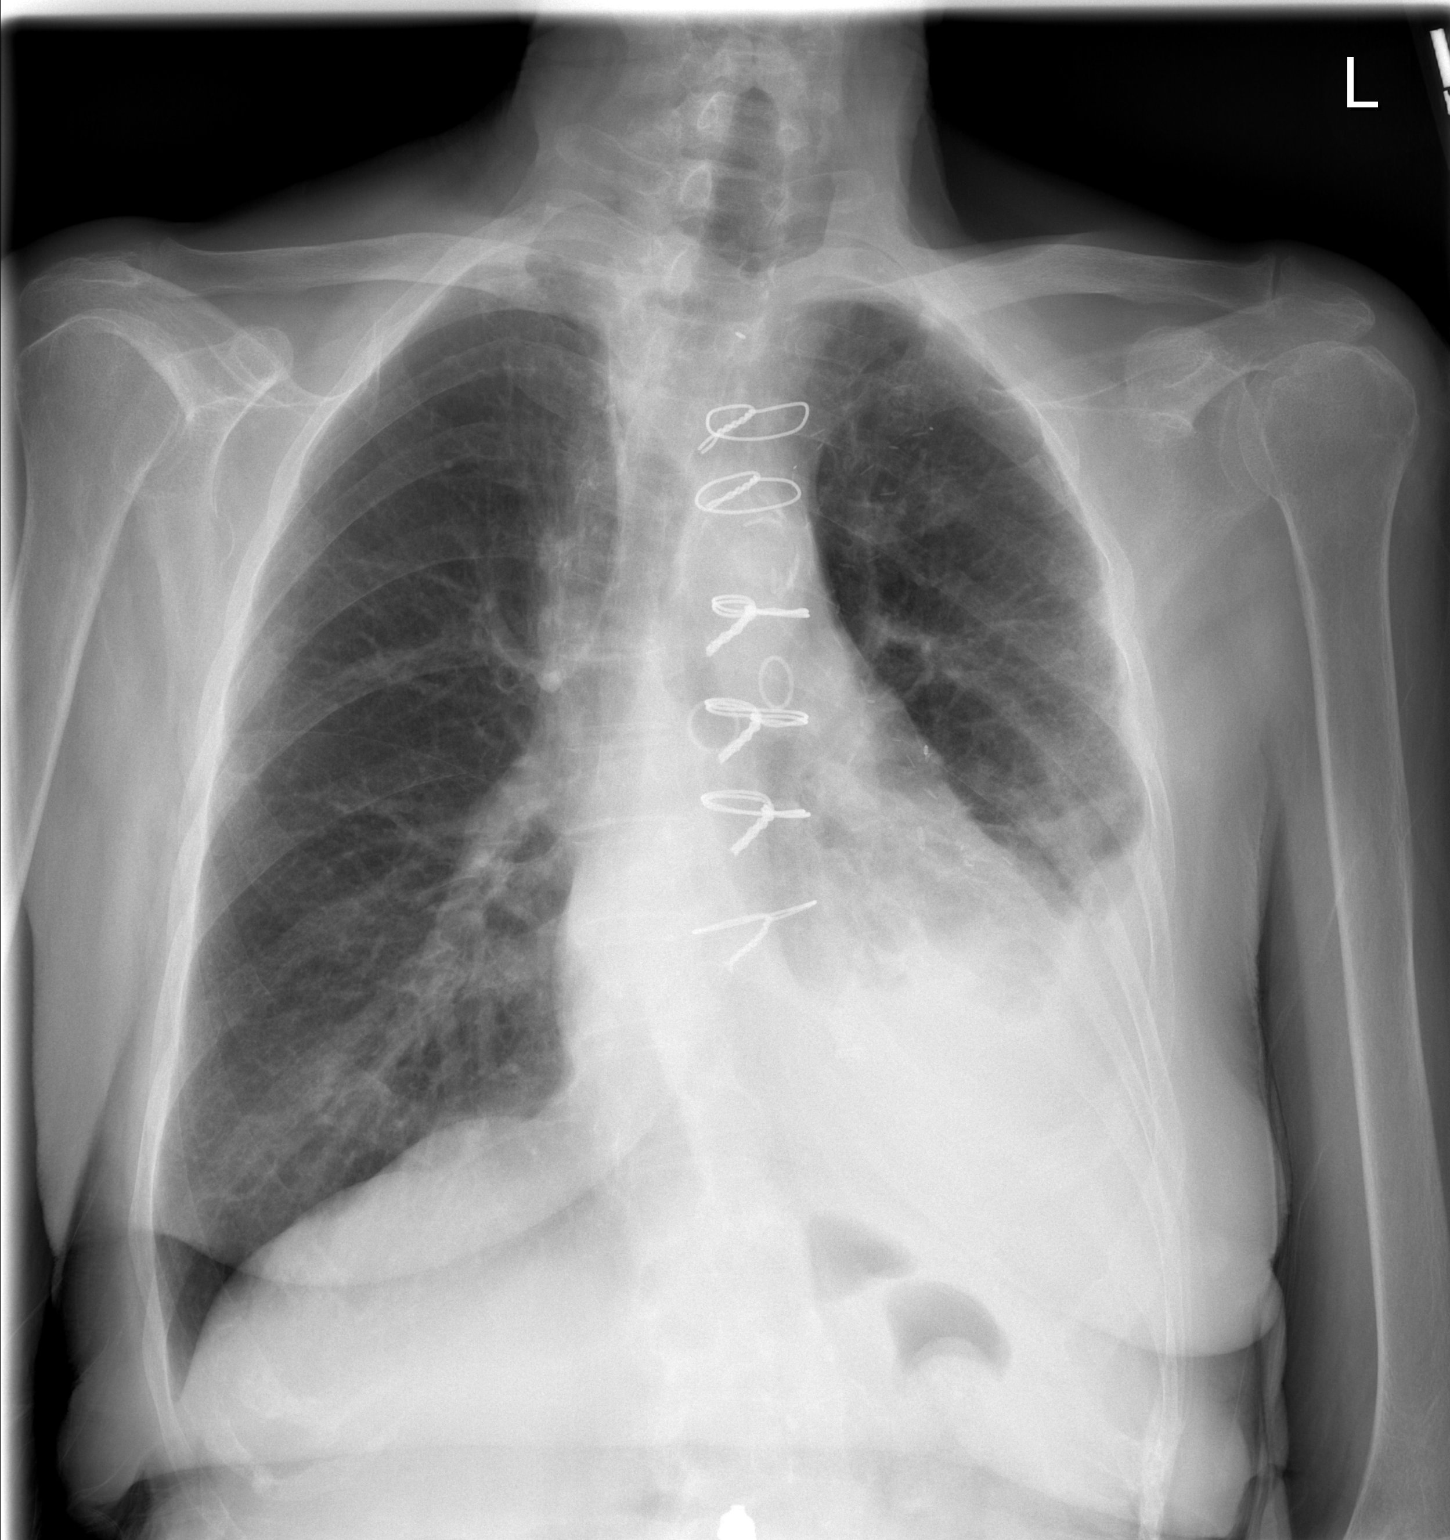

[2 of 2 positions shown; findings below may reference images not displayed]

FINDINGS: A moderate left pleural effusion has reaccumulated, not
quite as large as on the pre thoracentesis image of 08/03/2012.

The heart size and pulmonary vascularity are normal.  Right lung is
clear.  No pneumothorax.
IMPRESSION: Reaccumulation of moderate left pleural effusion.

## 2015-01-01 DIAGNOSIS — L930 Discoid lupus erythematosus: Secondary | ICD-10-CM | POA: Diagnosis not present

## 2015-01-01 DIAGNOSIS — L818 Other specified disorders of pigmentation: Secondary | ICD-10-CM | POA: Diagnosis not present

## 2015-01-17 DIAGNOSIS — Z23 Encounter for immunization: Secondary | ICD-10-CM | POA: Diagnosis not present

## 2015-01-22 DIAGNOSIS — H353130 Nonexudative age-related macular degeneration, bilateral, stage unspecified: Secondary | ICD-10-CM | POA: Diagnosis not present

## 2015-03-20 DIAGNOSIS — Z951 Presence of aortocoronary bypass graft: Secondary | ICD-10-CM | POA: Diagnosis not present

## 2015-03-20 DIAGNOSIS — Z8679 Personal history of other diseases of the circulatory system: Secondary | ICD-10-CM | POA: Diagnosis not present

## 2015-03-20 DIAGNOSIS — E782 Mixed hyperlipidemia: Secondary | ICD-10-CM | POA: Diagnosis not present

## 2015-03-20 DIAGNOSIS — I10 Essential (primary) hypertension: Secondary | ICD-10-CM | POA: Diagnosis not present

## 2015-03-20 DIAGNOSIS — N183 Chronic kidney disease, stage 3 (moderate): Secondary | ICD-10-CM | POA: Diagnosis not present

## 2015-03-20 DIAGNOSIS — Z950 Presence of cardiac pacemaker: Secondary | ICD-10-CM | POA: Diagnosis not present

## 2015-03-20 DIAGNOSIS — I480 Paroxysmal atrial fibrillation: Secondary | ICD-10-CM | POA: Diagnosis not present

## 2015-03-20 DIAGNOSIS — I25810 Atherosclerosis of coronary artery bypass graft(s) without angina pectoris: Secondary | ICD-10-CM | POA: Diagnosis not present

## 2015-04-01 DIAGNOSIS — Z79899 Other long term (current) drug therapy: Secondary | ICD-10-CM | POA: Diagnosis not present

## 2015-04-01 DIAGNOSIS — E782 Mixed hyperlipidemia: Secondary | ICD-10-CM | POA: Diagnosis not present

## 2015-04-01 DIAGNOSIS — D638 Anemia in other chronic diseases classified elsewhere: Secondary | ICD-10-CM | POA: Diagnosis not present

## 2015-04-01 DIAGNOSIS — E538 Deficiency of other specified B group vitamins: Secondary | ICD-10-CM | POA: Diagnosis not present

## 2015-04-10 DIAGNOSIS — Z0000 Encounter for general adult medical examination without abnormal findings: Secondary | ICD-10-CM | POA: Diagnosis not present

## 2015-04-10 DIAGNOSIS — E538 Deficiency of other specified B group vitamins: Secondary | ICD-10-CM | POA: Diagnosis not present

## 2015-04-10 DIAGNOSIS — R011 Cardiac murmur, unspecified: Secondary | ICD-10-CM | POA: Diagnosis not present

## 2015-04-10 DIAGNOSIS — N184 Chronic kidney disease, stage 4 (severe): Secondary | ICD-10-CM | POA: Diagnosis not present

## 2015-04-10 DIAGNOSIS — M359 Systemic involvement of connective tissue, unspecified: Secondary | ICD-10-CM | POA: Diagnosis not present

## 2015-04-17 DIAGNOSIS — R011 Cardiac murmur, unspecified: Secondary | ICD-10-CM | POA: Diagnosis not present

## 2015-06-14 DIAGNOSIS — Z7982 Long term (current) use of aspirin: Secondary | ICD-10-CM | POA: Diagnosis not present

## 2015-06-14 DIAGNOSIS — I10 Essential (primary) hypertension: Secondary | ICD-10-CM | POA: Insufficient documentation

## 2015-06-14 DIAGNOSIS — Z79899 Other long term (current) drug therapy: Secondary | ICD-10-CM | POA: Insufficient documentation

## 2015-06-14 DIAGNOSIS — R319 Hematuria, unspecified: Secondary | ICD-10-CM | POA: Insufficient documentation

## 2015-06-14 DIAGNOSIS — Z792 Long term (current) use of antibiotics: Secondary | ICD-10-CM | POA: Insufficient documentation

## 2015-06-14 DIAGNOSIS — N939 Abnormal uterine and vaginal bleeding, unspecified: Secondary | ICD-10-CM | POA: Diagnosis present

## 2015-06-14 NOTE — ED Notes (Addendum)
Patient reports getting ready for bed and "felt something running down my legs and it was blood.  I have hemorrid and I thought it was them but its not it was coming from my vagina."  Patient reports that she takes Elaquis twice a day.

## 2015-06-15 ENCOUNTER — Emergency Department
Admission: EM | Admit: 2015-06-15 | Discharge: 2015-06-15 | Disposition: A | Discharge status: Home / Self Care | Payer: Commercial Managed Care - HMO | Attending: Emergency Medicine | Admitting: Emergency Medicine

## 2015-06-15 DIAGNOSIS — R319 Hematuria, unspecified: Secondary | ICD-10-CM

## 2015-06-15 LAB — URINALYSIS COMPLETE WITH MICROSCOPIC (ARMC ONLY)
BACTERIA UA: NONE SEEN
BILIRUBIN URINE: NEGATIVE
GLUCOSE, UA: NEGATIVE mg/dL
Ketones, ur: NEGATIVE mg/dL
LEUKOCYTES UA: NEGATIVE
Nitrite: NEGATIVE
PH: 7 (ref 5.0–8.0)
Protein, ur: 30 mg/dL — AB
Specific Gravity, Urine: 1.008 (ref 1.005–1.030)
WBC, UA: NONE SEEN WBC/hpf (ref 0–5)

## 2015-06-15 LAB — CBC
HCT: 34.7 % — ABNORMAL LOW (ref 35.0–47.0)
Hemoglobin: 11.6 g/dL — ABNORMAL LOW (ref 12.0–16.0)
MCH: 30.4 pg (ref 26.0–34.0)
MCHC: 33.6 g/dL (ref 32.0–36.0)
MCV: 90.6 fL (ref 80.0–100.0)
PLATELETS: 174 10*3/uL (ref 150–440)
RBC: 3.83 MIL/uL (ref 3.80–5.20)
RDW: 14.1 % (ref 11.5–14.5)
WBC: 6.3 10*3/uL (ref 3.6–11.0)

## 2015-06-15 LAB — APTT: APTT: 36 s (ref 24–36)

## 2015-06-15 LAB — PROTIME-INR
INR: 1.37
PROTHROMBIN TIME: 17 s — AB (ref 11.4–15.0)

## 2015-06-15 MED ORDER — CEPHALEXIN 500 MG PO CAPS: 500.0000 mg | ORAL_CAPSULE | Freq: Four times a day (QID) | ORAL | Status: AC

## 2015-06-15 NOTE — ED Provider Notes (Signed)
[facility] Emergency Department Provider Note  ____________________________________________  Time seen: Approximately 259 AM  I have reviewed the triage vital signs and the nursing notes.   HISTORY  Chief Complaint Vaginal Bleeding    HPI [name] is a 82 y.o. female who comes into the hospital today with vaginal bleeding. The patient reports she was doing well until tonight. She was turning down her covers and getting ready to go to bed when she felt something running down her leg. When the patient quite that she noticed that it was blood. She reports that it seems to be coming from her vagina. She reports that she has hemorrhoids and thought initially was from her bottom but when she checked in the bathroom from her vagina. She reports currently the bleeding has stopped but she did notice some specks in the bathroom after her arrival to the emergency department. The patient has never had a hysterectomy and does not have an OB/GYN. She denies any abdominal pain and reports that this has not occurred before. The patient is currently on L Eliquis.    Past Medical History  Diagnosis Date  . Allergic rhinitis   . Anemia   . Coronary artery disease   . Hyperlipidemia   . Hypertension   . Osteopenia   . GERD (gastroesophageal reflux disease)   . Anginal pain   . Heart murmur   . Recurrent pleural effusion on left   . Shortness of breath     "since the heart OR in 05/2012 cause of fluid" (08/17/2012)  . Osteoarthritis of hand     Patient Active Problem List   Diagnosis Date Noted  . S/P CABG x 3 06/13/2012  . Coronary artery disease 06/13/2012  . CONSTIPATION, MILD 05/07/2009  . BACK PAIN, LUMBAR 05/07/2009  . LEG PAIN, BILATERAL 05/07/2009  . VERTIGO 05/16/2008  . COLONIC POLYPS, ADENOMATOUS 09/13/2007  . GERD 04/20/2007  . ANXIETY DISORDER, GENERALIZED 08/29/2006  . SYSTEMIC LUPUS ERYTHEMATOSUS 08/29/2006  . HYPERLIPIDEMIA 08/25/2006  .  ANEMIA-NOS 08/25/2006  . HYPERTENSION 08/25/2006  . CAROTID ARTERY DISEASE 08/25/2006  . ALLERGIC RHINITIS 08/25/2006  . [place] SYNDROME 08/25/2006  . OSTEOPENIA 08/23/2006    Past Surgical History  Procedure Laterality Date  . Carotid testing negative    . Stress echo negative  7/04  . Colonoscopy  9/02    polyp, hematochezia adenoma due in 5 yrs.  . Dexa op  03/02  . Tspine xray scoliosis  11/09/2004    mild thoracic  . Tee without cardioversion N/A 06/08/2012    Procedure: TRANSESOPHAGEAL ECHOCARDIOGRAM (TEE);  Surgeon: Melrose Nakayama, MD;  Location: [place];  Service: Open Heart Surgery;  Laterality: N/A;  . Insertion / placement pleural catheter Left 08/17/2012  . Breast cyst aspiration Left ~ 1948    "benign" (08/17/2012)  . Coronary artery bypass graft N/A 06/08/2012    Procedure: CORONARY ARTERY BYPASS GRAFTING (CABG);  Surgeon: Melrose Nakayama, MD;  Location: [place];  Service: Open Heart Surgery;  Laterality: N/A;  . Coronary angioplasty    . Chest tube insertion Left 08/17/2012    Procedure: INSERTION PLEURAL DRAINAGE CATHETER;  Surgeon: Melrose Nakayama, MD;  Location: [place];  Service: Thoracic;  Laterality: Left;    Current Outpatient Rx  Name  Route  Sig  Dispense  Refill  . acetaminophen (TYLENOL) 500 MG tablet   Oral   Take 500 mg by mouth 2 (two) times daily.         Marland Kitchen  ALPRAZolam (XANAX) 0.25 MG tablet   Oral   Take 0.25 mg by mouth See admin instructions. Takes 1 tablet every night but may take an extra 0.25 to 0.5 tablets if needed throughout the day         . aspirin EC 81 MG tablet   Oral   Take 81 mg by mouth daily at 12 noon.         . cephALEXin (KEFLEX) 500 MG capsule   Oral   Take 1 capsule (500 mg total) by mouth 4 (four) times daily.   20 capsule   0   . clobetasol cream (TEMOVATE) 0.05 %               . ferrous sulfate 325 (65 FE) MG tablet   Oral   Take 325 mg by mouth daily at 12 noon.         . fluorometholone  (FML) 0.1 % ophthalmic suspension               . hydroxychloroquine (PLAQUENIL) 200 MG tablet               . hydroxypropyl methylcellulose (ISOPTO TEARS) 2.5 % ophthalmic solution   Both Eyes   Place 1 drop into both eyes 2 (two) times daily as needed (dry eyes).         Marland Kitchen levothyroxine (SYNTHROID, LEVOTHROID) 50 MCG tablet   Oral   Take 50 mcg by mouth daily before breakfast.          . magnesium hydroxide (MILK OF MAGNESIA) 400 MG/5ML suspension   Oral   Take 10 mLs by mouth daily as needed for constipation.         . polyethylene glycol powder (GLYCOLAX/MIRALAX) powder               . Potassium Chloride ER 20 MEQ TBCR   Oral   Take 20 mEq by mouth every morning. For 5 Days         . potassium chloride SA (K-DUR,KLOR-CON) 20 MEQ tablet               . pravastatin (PRAVACHOL) 80 MG tablet   Oral   Take 40 mg by mouth at bedtime.          . predniSONE (DELTASONE) 10 MG tablet               . triamcinolone cream (KENALOG) 0.1 %                 Allergies Alendronate sodium and Simvastatin  Family History  Problem Relation Age of Onset  . Heart disease Mother   . Hypertension Mother   . Cancer Father 65    liver  . Alzheimer's disease Sister   . Hypertension Brother   . Sudden death Brother   . Hypertension Brother 82  . Heart disease Brother   . Hypertension Brother   . Alzheimer's disease Sister     Social History Social History  Substance Use Topics  . Smoking status: Never Smoker   . Smokeless tobacco: Never Used  . Alcohol Use: No    Review of Systems Constitutional: No fever/chills Eyes: No visual changes. ENT: No sore throat. Cardiovascular: Denies chest pain. Respiratory: Denies shortness of breath. Gastrointestinal: No abdominal pain.  No nausea, no vomiting.  No diarrhea.  No constipation. Genitourinary: Vaginal bleeding Musculoskeletal: Negative for back pain. Skin: Negative for rash. Neurological:  Negative for headaches, focal weakness or numbness.  10-point ROS otherwise negative.  ____________________________________________   PHYSICAL EXAM:  VITAL SIGNS: ED Triage Vitals  Enc Vitals Group     BP 06/14/15 2341 173/61 mmHg     Pulse Rate 06/14/15 2341 89     Resp 06/14/15 2341 18     Temp 06/14/15 2341 98.3 F (36.8 C)     Temp Source 06/14/15 2341 Oral     SpO2 06/14/15 2341 97 %     Weight 06/14/15 2341 114 lb (51.71 kg)     Height 06/14/15 2341 5\' 2"  (1.575 m)     Head Cir --      Peak Flow --      Pain Score --      Pain Loc --      Pain Edu? --      Excl. in [place]? --     Constitutional: Alert and oriented. Well appearing and in no acute distress. Eyes: Conjunctivae are normal. PERRL. EOMI. Head: Atraumatic. Nose: No congestion/rhinnorhea. Mouth/Throat: Mucous membranes are moist.  Oropharynx non-erythematous. Cardiovascular: Normal rate, regular rhythm. Grossly normal heart sounds.  Good peripheral circulation. Respiratory: Normal respiratory effort.  No retractions. Lungs CTAB. Gastrointestinal: Soft and nontender. No distention. Positive bowel sounds Genitourinary: Normal external genitalia, no vaginal bleeding, cervix normal in appearance with no tenderness to palpation. Musculoskeletal: No lower extremity tenderness nor edema.   Neurologic:  Normal speech and language.  Skin:  Skin is warm, dry and intact.  Psychiatric: Mood and affect are normal.   ____________________________________________   LABS (all labs ordered are listed, but only abnormal results are displayed)  Labs Reviewed  CBC - Abnormal; Notable for the following:    Hemoglobin 11.6 (*)    HCT 34.7 (*)    All other components within normal limits  PROTIME-INR - Abnormal; Notable for the following:    Prothrombin Time 17.0 (*)    All other components within normal limits  URINALYSIS COMPLETEWITH MICROSCOPIC ([facility] ONLY) - Abnormal; Notable for the following:    Color, Urine RED (*)     APPearance HAZY (*)    Hgb urine dipstick 3+ (*)    Protein, ur 30 (*)    Squamous Epithelial / LPF 0-5 (*)    All other components within normal limits  URINE CULTURE  APTT   ____________________________________________  EKG  None ____________________________________________  RADIOLOGY  None ____________________________________________   PROCEDURES  Procedure(s) performed: None  Critical Care performed: No  ____________________________________________   INITIAL IMPRESSION / ASSESSMENT AND PLAN / ED COURSE  Pertinent labs & imaging results that were available during my care of the patient were reviewed by me and considered in my medical decision making (see chart for details).  This is an 82 year old female who comes into the hospital with vaginal bleeding. I will check the patient's pelvic exam and reassess the patient once I determined the origin of her bleeding.   I did check some urine and it appears as though the patient's bleeding is from her bladder and not from her vagina. I did place the patient on some antibiotics and sent a urine culture evaluating for infection. I feel that the patient needs to follow-up with urology and she needs to contact her cardiologist to determine if she is able to stop taking L [place] or decrease her dose. Otherwise the patient is well in the emergency department. She will be discharged to home to follow-up with her urologist as well as cardiologist.   ____________________________________________   FINAL CLINICAL IMPRESSION(S) /  ED DIAGNOSES  Final diagnoses:  Hematuria      Loney Hering, MD 06/15/15 [phone]

## 2015-06-15 NOTE — ED Notes (Addendum)
MD Webster at bedside 

## 2015-06-15 NOTE — Discharge Instructions (Signed)

## 2015-06-16 DIAGNOSIS — I480 Paroxysmal atrial fibrillation: Secondary | ICD-10-CM | POA: Diagnosis not present

## 2015-06-16 DIAGNOSIS — N184 Chronic kidney disease, stage 4 (severe): Secondary | ICD-10-CM | POA: Diagnosis not present

## 2015-06-16 DIAGNOSIS — M359 Systemic involvement of connective tissue, unspecified: Secondary | ICD-10-CM | POA: Diagnosis not present

## 2015-06-16 DIAGNOSIS — N3001 Acute cystitis with hematuria: Secondary | ICD-10-CM | POA: Diagnosis not present

## 2015-06-16 LAB — URINE CULTURE

## 2015-07-01 DIAGNOSIS — R300 Dysuria: Secondary | ICD-10-CM | POA: Diagnosis not present

## 2015-07-03 DIAGNOSIS — E538 Deficiency of other specified B group vitamins: Secondary | ICD-10-CM | POA: Diagnosis not present

## 2015-07-03 DIAGNOSIS — N184 Chronic kidney disease, stage 4 (severe): Secondary | ICD-10-CM | POA: Diagnosis not present

## 2015-07-03 DIAGNOSIS — R5383 Other fatigue: Secondary | ICD-10-CM | POA: Diagnosis not present

## 2015-07-09 DIAGNOSIS — N184 Chronic kidney disease, stage 4 (severe): Secondary | ICD-10-CM | POA: Diagnosis not present

## 2015-07-09 DIAGNOSIS — I2510 Atherosclerotic heart disease of native coronary artery without angina pectoris: Secondary | ICD-10-CM | POA: Diagnosis not present

## 2015-07-09 DIAGNOSIS — Z950 Presence of cardiac pacemaker: Secondary | ICD-10-CM | POA: Diagnosis not present

## 2015-07-09 DIAGNOSIS — I480 Paroxysmal atrial fibrillation: Secondary | ICD-10-CM | POA: Diagnosis not present

## 2015-07-09 DIAGNOSIS — I350 Nonrheumatic aortic (valve) stenosis: Secondary | ICD-10-CM | POA: Diagnosis not present

## 2015-07-09 DIAGNOSIS — Z8679 Personal history of other diseases of the circulatory system: Secondary | ICD-10-CM | POA: Diagnosis not present

## 2015-07-09 DIAGNOSIS — Z951 Presence of aortocoronary bypass graft: Secondary | ICD-10-CM | POA: Diagnosis not present

## 2015-07-30 DIAGNOSIS — L930 Discoid lupus erythematosus: Secondary | ICD-10-CM | POA: Diagnosis not present

## 2015-09-01 DIAGNOSIS — I350 Nonrheumatic aortic (valve) stenosis: Secondary | ICD-10-CM | POA: Diagnosis not present

## 2015-09-01 DIAGNOSIS — R0602 Shortness of breath: Secondary | ICD-10-CM | POA: Diagnosis not present

## 2015-09-01 DIAGNOSIS — R5383 Other fatigue: Secondary | ICD-10-CM | POA: Diagnosis not present

## 2015-09-18 DIAGNOSIS — Z8679 Personal history of other diseases of the circulatory system: Secondary | ICD-10-CM | POA: Diagnosis not present

## 2015-09-18 DIAGNOSIS — N184 Chronic kidney disease, stage 4 (severe): Secondary | ICD-10-CM | POA: Diagnosis not present

## 2015-09-18 DIAGNOSIS — Z950 Presence of cardiac pacemaker: Secondary | ICD-10-CM | POA: Diagnosis not present

## 2015-09-18 DIAGNOSIS — I10 Essential (primary) hypertension: Secondary | ICD-10-CM | POA: Diagnosis not present

## 2015-09-18 DIAGNOSIS — Z951 Presence of aortocoronary bypass graft: Secondary | ICD-10-CM | POA: Diagnosis not present

## 2015-09-18 DIAGNOSIS — I350 Nonrheumatic aortic (valve) stenosis: Secondary | ICD-10-CM | POA: Diagnosis not present

## 2015-09-18 DIAGNOSIS — I480 Paroxysmal atrial fibrillation: Secondary | ICD-10-CM | POA: Diagnosis not present

## 2015-09-18 DIAGNOSIS — E7800 Pure hypercholesterolemia, unspecified: Secondary | ICD-10-CM | POA: Diagnosis not present

## 2015-10-09 DIAGNOSIS — E782 Mixed hyperlipidemia: Secondary | ICD-10-CM | POA: Diagnosis not present

## 2015-10-09 DIAGNOSIS — I350 Nonrheumatic aortic (valve) stenosis: Secondary | ICD-10-CM | POA: Diagnosis not present

## 2015-10-09 DIAGNOSIS — I480 Paroxysmal atrial fibrillation: Secondary | ICD-10-CM | POA: Diagnosis not present

## 2015-10-30 DIAGNOSIS — I480 Paroxysmal atrial fibrillation: Secondary | ICD-10-CM | POA: Diagnosis not present

## 2015-10-30 DIAGNOSIS — R000 Tachycardia, unspecified: Secondary | ICD-10-CM | POA: Diagnosis not present

## 2015-12-10 DIAGNOSIS — I480 Paroxysmal atrial fibrillation: Secondary | ICD-10-CM | POA: Diagnosis not present

## 2015-12-10 DIAGNOSIS — R5383 Other fatigue: Secondary | ICD-10-CM | POA: Diagnosis not present

## 2015-12-18 DIAGNOSIS — H35389 Toxic maculopathy, unspecified eye: Secondary | ICD-10-CM | POA: Diagnosis not present

## 2015-12-18 DIAGNOSIS — H353131 Nonexudative age-related macular degeneration, bilateral, early dry stage: Secondary | ICD-10-CM | POA: Diagnosis not present

## 2015-12-18 DIAGNOSIS — Z79899 Other long term (current) drug therapy: Secondary | ICD-10-CM | POA: Diagnosis not present

## 2016-01-06 DIAGNOSIS — Z23 Encounter for immunization: Secondary | ICD-10-CM | POA: Diagnosis not present

## 2016-03-18 DIAGNOSIS — Z951 Presence of aortocoronary bypass graft: Secondary | ICD-10-CM | POA: Diagnosis not present

## 2016-03-18 DIAGNOSIS — I2510 Atherosclerotic heart disease of native coronary artery without angina pectoris: Secondary | ICD-10-CM | POA: Diagnosis not present

## 2016-03-18 DIAGNOSIS — I350 Nonrheumatic aortic (valve) stenosis: Secondary | ICD-10-CM | POA: Diagnosis not present

## 2016-03-18 DIAGNOSIS — I480 Paroxysmal atrial fibrillation: Secondary | ICD-10-CM | POA: Diagnosis not present

## 2016-03-18 DIAGNOSIS — Z950 Presence of cardiac pacemaker: Secondary | ICD-10-CM | POA: Diagnosis not present

## 2016-03-18 DIAGNOSIS — N184 Chronic kidney disease, stage 4 (severe): Secondary | ICD-10-CM | POA: Diagnosis not present

## 2016-03-18 DIAGNOSIS — Z8679 Personal history of other diseases of the circulatory system: Secondary | ICD-10-CM | POA: Diagnosis not present

## 2016-04-08 DIAGNOSIS — D500 Iron deficiency anemia secondary to blood loss (chronic): Secondary | ICD-10-CM | POA: Diagnosis not present

## 2016-04-08 DIAGNOSIS — E538 Deficiency of other specified B group vitamins: Secondary | ICD-10-CM | POA: Diagnosis not present

## 2016-04-08 DIAGNOSIS — I350 Nonrheumatic aortic (valve) stenosis: Secondary | ICD-10-CM | POA: Diagnosis not present

## 2016-04-08 DIAGNOSIS — E782 Mixed hyperlipidemia: Secondary | ICD-10-CM | POA: Diagnosis not present

## 2016-04-14 DIAGNOSIS — N184 Chronic kidney disease, stage 4 (severe): Secondary | ICD-10-CM | POA: Diagnosis not present

## 2016-04-14 DIAGNOSIS — Z0000 Encounter for general adult medical examination without abnormal findings: Secondary | ICD-10-CM | POA: Diagnosis not present

## 2016-04-14 DIAGNOSIS — I350 Nonrheumatic aortic (valve) stenosis: Secondary | ICD-10-CM | POA: Diagnosis not present

## 2016-04-14 DIAGNOSIS — M359 Systemic involvement of connective tissue, unspecified: Secondary | ICD-10-CM | POA: Diagnosis not present

## 2016-04-14 DIAGNOSIS — I480 Paroxysmal atrial fibrillation: Secondary | ICD-10-CM | POA: Diagnosis not present

## 2016-04-23 DIAGNOSIS — R05 Cough: Secondary | ICD-10-CM | POA: Diagnosis not present

## 2016-04-23 DIAGNOSIS — R6883 Chills (without fever): Secondary | ICD-10-CM | POA: Diagnosis not present

## 2016-04-23 DIAGNOSIS — R5381 Other malaise: Secondary | ICD-10-CM | POA: Diagnosis not present

## 2016-04-23 DIAGNOSIS — R5383 Other fatigue: Secondary | ICD-10-CM | POA: Diagnosis not present

## 2016-04-27 DIAGNOSIS — K5900 Constipation, unspecified: Secondary | ICD-10-CM | POA: Diagnosis not present

## 2016-04-27 DIAGNOSIS — R1084 Generalized abdominal pain: Secondary | ICD-10-CM | POA: Diagnosis not present

## 2016-05-03 DIAGNOSIS — I350 Nonrheumatic aortic (valve) stenosis: Secondary | ICD-10-CM | POA: Diagnosis not present

## 2016-05-03 DIAGNOSIS — Z951 Presence of aortocoronary bypass graft: Secondary | ICD-10-CM | POA: Diagnosis not present

## 2016-05-07 DIAGNOSIS — I350 Nonrheumatic aortic (valve) stenosis: Secondary | ICD-10-CM | POA: Diagnosis not present

## 2016-05-07 DIAGNOSIS — I480 Paroxysmal atrial fibrillation: Secondary | ICD-10-CM | POA: Diagnosis not present

## 2016-05-10 DIAGNOSIS — Z950 Presence of cardiac pacemaker: Secondary | ICD-10-CM | POA: Diagnosis not present

## 2016-05-10 DIAGNOSIS — I480 Paroxysmal atrial fibrillation: Secondary | ICD-10-CM | POA: Diagnosis not present

## 2016-05-10 DIAGNOSIS — Z8679 Personal history of other diseases of the circulatory system: Secondary | ICD-10-CM | POA: Diagnosis not present

## 2016-05-10 DIAGNOSIS — I350 Nonrheumatic aortic (valve) stenosis: Secondary | ICD-10-CM | POA: Diagnosis not present

## 2016-05-10 DIAGNOSIS — I2510 Atherosclerotic heart disease of native coronary artery without angina pectoris: Secondary | ICD-10-CM | POA: Diagnosis not present

## 2016-05-10 DIAGNOSIS — N184 Chronic kidney disease, stage 4 (severe): Secondary | ICD-10-CM | POA: Diagnosis not present

## 2016-05-10 DIAGNOSIS — Z951 Presence of aortocoronary bypass graft: Secondary | ICD-10-CM | POA: Diagnosis not present

## 2016-06-01 DIAGNOSIS — R05 Cough: Secondary | ICD-10-CM | POA: Diagnosis not present

## 2016-06-16 DIAGNOSIS — I6529 Occlusion and stenosis of unspecified carotid artery: Secondary | ICD-10-CM | POA: Diagnosis not present

## 2016-06-16 DIAGNOSIS — I10 Essential (primary) hypertension: Secondary | ICD-10-CM | POA: Diagnosis not present

## 2016-06-16 DIAGNOSIS — E782 Mixed hyperlipidemia: Secondary | ICD-10-CM | POA: Diagnosis not present

## 2016-06-16 DIAGNOSIS — I2510 Atherosclerotic heart disease of native coronary artery without angina pectoris: Secondary | ICD-10-CM | POA: Diagnosis not present

## 2016-06-16 DIAGNOSIS — I350 Nonrheumatic aortic (valve) stenosis: Secondary | ICD-10-CM | POA: Diagnosis not present

## 2016-06-16 DIAGNOSIS — Z8679 Personal history of other diseases of the circulatory system: Secondary | ICD-10-CM | POA: Diagnosis not present

## 2016-06-16 DIAGNOSIS — R200 Anesthesia of skin: Secondary | ICD-10-CM | POA: Diagnosis not present

## 2016-06-16 DIAGNOSIS — I480 Paroxysmal atrial fibrillation: Secondary | ICD-10-CM | POA: Diagnosis not present

## 2016-06-16 DIAGNOSIS — N184 Chronic kidney disease, stage 4 (severe): Secondary | ICD-10-CM | POA: Diagnosis not present

## 2016-06-22 DIAGNOSIS — F5101 Primary insomnia: Secondary | ICD-10-CM | POA: Diagnosis not present

## 2016-06-29 DIAGNOSIS — I350 Nonrheumatic aortic (valve) stenosis: Secondary | ICD-10-CM | POA: Diagnosis not present

## 2016-06-29 DIAGNOSIS — I10 Essential (primary) hypertension: Secondary | ICD-10-CM | POA: Diagnosis not present

## 2016-06-29 DIAGNOSIS — J90 Pleural effusion, not elsewhere classified: Secondary | ICD-10-CM | POA: Diagnosis not present

## 2016-06-29 DIAGNOSIS — R0609 Other forms of dyspnea: Secondary | ICD-10-CM | POA: Diagnosis not present

## 2016-07-06 DIAGNOSIS — I5033 Acute on chronic diastolic (congestive) heart failure: Secondary | ICD-10-CM | POA: Diagnosis not present

## 2016-07-06 DIAGNOSIS — I350 Nonrheumatic aortic (valve) stenosis: Secondary | ICD-10-CM | POA: Diagnosis not present

## 2016-07-06 DIAGNOSIS — I480 Paroxysmal atrial fibrillation: Secondary | ICD-10-CM | POA: Diagnosis not present

## 2016-07-16 DIAGNOSIS — I350 Nonrheumatic aortic (valve) stenosis: Secondary | ICD-10-CM | POA: Diagnosis not present

## 2016-07-16 DIAGNOSIS — I480 Paroxysmal atrial fibrillation: Secondary | ICD-10-CM | POA: Diagnosis not present

## 2016-07-22 ENCOUNTER — Encounter: Payer: Self-pay | Admitting: Emergency Medicine

## 2016-07-22 ENCOUNTER — Emergency Department: Payer: Medicare HMO

## 2016-07-22 ENCOUNTER — Emergency Department
Admission: EM | Admit: 2016-07-22 | Discharge: 2016-07-22 | Disposition: A | Discharge status: Home / Self Care | Payer: Medicare HMO | Attending: Emergency Medicine | Admitting: Emergency Medicine

## 2016-07-22 DIAGNOSIS — I4891 Unspecified atrial fibrillation: Secondary | ICD-10-CM

## 2016-07-22 DIAGNOSIS — R002 Palpitations: Secondary | ICD-10-CM | POA: Diagnosis present

## 2016-07-22 DIAGNOSIS — I2510 Atherosclerotic heart disease of native coronary artery without angina pectoris: Secondary | ICD-10-CM | POA: Insufficient documentation

## 2016-07-22 DIAGNOSIS — I10 Essential (primary) hypertension: Secondary | ICD-10-CM | POA: Diagnosis not present

## 2016-07-22 DIAGNOSIS — Z79899 Other long term (current) drug therapy: Secondary | ICD-10-CM | POA: Diagnosis not present

## 2016-07-22 DIAGNOSIS — I480 Paroxysmal atrial fibrillation: Secondary | ICD-10-CM | POA: Diagnosis not present

## 2016-07-22 DIAGNOSIS — R000 Tachycardia, unspecified: Secondary | ICD-10-CM | POA: Diagnosis not present

## 2016-07-22 LAB — CBC
HCT: 35 % (ref 35.0–47.0)
Hemoglobin: 11.4 g/dL — ABNORMAL LOW (ref 12.0–16.0)
MCH: 30 pg (ref 26.0–34.0)
MCHC: 32.7 g/dL (ref 32.0–36.0)
MCV: 91.6 fL (ref 80.0–100.0)
PLATELETS: 231 10*3/uL (ref 150–440)
RBC: 3.82 MIL/uL (ref 3.80–5.20)
RDW: 14.9 % — ABNORMAL HIGH (ref 11.5–14.5)
WBC: 5.6 10*3/uL (ref 3.6–11.0)

## 2016-07-22 LAB — BASIC METABOLIC PANEL
Anion gap: 7 (ref 5–15)
BUN: 18 mg/dL (ref 6–20)
CHLORIDE: 104 mmol/L (ref 101–111)
CO2: 25 mmol/L (ref 22–32)
CREATININE: 1.53 mg/dL — AB (ref 0.44–1.00)
Calcium: 8.4 mg/dL — ABNORMAL LOW (ref 8.9–10.3)
GFR calc non Af Amer: 29 mL/min — ABNORMAL LOW (ref >60)
GFR, EST AFRICAN AMERICAN: 33 mL/min — AB (ref >60)
Glucose, Bld: 112 mg/dL — ABNORMAL HIGH (ref 65–99)
Potassium: 3.5 mmol/L (ref 3.5–5.1)
Sodium: 136 mmol/L (ref 135–145)

## 2016-07-22 LAB — TROPONIN I
Troponin I: 0.03 ng/mL (ref <0.03)
Troponin I: 0.03 ng/mL (ref <0.03)

## 2016-07-22 MED ORDER — DILTIAZEM HCL 25 MG/5ML IV SOLN
15.0000 mg | Freq: Once | INTRAVENOUS | Status: DC
  Filled 2016-07-22: qty 5

## 2016-07-22 MED ORDER — METOPROLOL TARTRATE 5 MG/5ML IV SOLN
INTRAVENOUS | Status: AC
  Filled 2016-07-22: qty 5

## 2016-07-22 MED ORDER — METOPROLOL TARTRATE 5 MG/5ML IV SOLN
5.0000 mg | Freq: Once | INTRAVENOUS | Status: AC
  Administered 2016-07-22: 5 mg via INTRAVENOUS

## 2016-07-22 NOTE — ED Notes (Signed)
Pt states she woke up today and her HR was fast. She went to get paper, she took lasix and potassium twice today. Dr. Sabra Heck told her to do that. Pt has hx of leaky valve, and bypass 3 years ago. Pt is alert, oriented, denies CP, SOB, nausea. States "I just feel it racing." Denies hx of tachycardia. Sees Dr. Sharlene Dory for cardiology.

## 2016-07-22 NOTE — Discharge Instructions (Signed)
Please seek medical attention for any high fevers, chest pain, shortness of breath, change in behavior, persistent vomiting, bloody stool or any other new or concerning symptoms.  

## 2016-07-22 NOTE — ED Triage Notes (Signed)
Pt reports feeling her heart racing and beating fast. Denies chest pain. Pt reports feeling short of breath sometimes. Pt states she has a pacemaker.

## 2016-07-22 NOTE — ED Provider Notes (Signed)
[facility] Emergency Department Provider Note   ____________________________________________   I have reviewed the triage vital signs and the nursing notes.   HISTORY  Chief Complaint Palpitations   History limited by: Not Limited   HPI [name] is a 83 y.o. female who presents to the emergency department today because of concerns for palpitations fast heart rate. Patient states she noticed this shortly upon awakening this morning. Has been persistent throughout the day. She has a history of paroxysmal A. fib and does have home medications. She tried taking the home medications as instructed by her doctor but this did resolve the tachycardia. She denies any chest pain. No shortness of breath.   Past Medical History:  Diagnosis Date  . Allergic rhinitis   . Anemia   . Anginal pain ([place])   . Coronary artery disease   . GERD (gastroesophageal reflux disease)   . Heart murmur   . Hyperlipidemia   . Hypertension   . Osteoarthritis of hand   . Osteopenia   . Recurrent pleural effusion on left   . Shortness of breath    "since the heart OR in 05/2012 cause of fluid" (08/17/2012)    Patient Active Problem List   Diagnosis Date Noted  . S/P CABG x 3 06/13/2012  . Coronary artery disease 06/13/2012  . CONSTIPATION, MILD 05/07/2009  . BACK PAIN, LUMBAR 05/07/2009  . LEG PAIN, BILATERAL 05/07/2009  . VERTIGO 05/16/2008  . COLONIC POLYPS, ADENOMATOUS 09/13/2007  . GERD 04/20/2007  . ANXIETY DISORDER, GENERALIZED 08/29/2006  . SYSTEMIC LUPUS ERYTHEMATOSUS 08/29/2006  . HYPERLIPIDEMIA 08/25/2006  . ANEMIA-NOS 08/25/2006  . HYPERTENSION 08/25/2006  . CAROTID ARTERY DISEASE 08/25/2006  . ALLERGIC RHINITIS 08/25/2006  . [place] SYNDROME 08/25/2006  . OSTEOPENIA 08/23/2006    Past Surgical History:  Procedure Laterality Date  . BREAST CYST ASPIRATION Left ~ 1948   "benign" (08/17/2012)  . carotid testing negative    . CHEST TUBE INSERTION Left  08/17/2012   Procedure: INSERTION PLEURAL DRAINAGE CATHETER;  Surgeon: Melrose Nakayama, MD;  Location: [facility];  Service: Thoracic;  Laterality: Left;  . COLONOSCOPY  9/02   polyp, hematochezia adenoma due in 5 yrs.  . CORONARY ANGIOPLASTY    . CORONARY ARTERY BYPASS GRAFT N/A 06/08/2012   Procedure: CORONARY ARTERY BYPASS GRAFTING (CABG);  Surgeon: Melrose Nakayama, MD;  Location: [facility];  Service: Open Heart Surgery;  Laterality: N/A;  . DEXA OP  03/02  . INSERTION / PLACEMENT PLEURAL CATHETER Left 08/17/2012  . stress Echo negative  7/04  . TEE WITHOUT CARDIOVERSION N/A 06/08/2012   Procedure: TRANSESOPHAGEAL ECHOCARDIOGRAM (TEE);  Surgeon: Melrose Nakayama, MD;  Location: [facility];  Service: Open Heart Surgery;  Laterality: N/A;  . TSpine XRay Scoliosis  11/09/2004   mild thoracic    Prior to Admission medications   Medication Sig Start Date End Date Taking? Authorizing Provider  acetaminophen (TYLENOL) 500 MG tablet Take 500 mg by mouth 2 (two) times daily. 06/13/12   Erin R Barrett, PA-C  ALPRAZolam (XANAX) 0.25 MG tablet Take 0.25 mg by mouth See admin instructions. Takes 1 tablet every night but may take an extra 0.25 to 0.5 tablets if needed throughout the day    Historical Provider, MD  aspirin EC 81 MG tablet Take 81 mg by mouth daily at 12 noon.    Historical Provider, MD  clobetasol cream (TEMOVATE) 0.05 %  10/10/12   Historical Provider, MD  ferrous sulfate 325 (65  FE) MG tablet Take 325 mg by mouth daily at 12 noon.    Historical Provider, MD  fluorometholone (FML) 0.1 % ophthalmic suspension  10/09/12   Historical Provider, MD  hydroxychloroquine (PLAQUENIL) 200 MG tablet  10/10/12   Historical Provider, MD  hydroxypropyl methylcellulose (ISOPTO TEARS) 2.5 % ophthalmic solution Place 1 drop into both eyes 2 (two) times daily as needed (dry eyes).    Historical Provider, MD  levothyroxine (SYNTHROID, LEVOTHROID) 50 MCG tablet Take 50 mcg by mouth daily before breakfast.  07/24/12    Historical Provider, MD  magnesium hydroxide (MILK OF MAGNESIA) 400 MG/5ML suspension Take 10 mLs by mouth daily as needed for constipation.    Historical Provider, MD  polyethylene glycol powder (GLYCOLAX/MIRALAX) powder  09/15/12   Historical Provider, MD  Potassium Chloride ER 20 MEQ TBCR Take 20 mEq by mouth every morning. For 5 Days 06/14/12   Erin R Barrett, PA-C  potassium chloride SA (K-DUR,KLOR-CON) 20 MEQ tablet  09/18/12   Historical Provider, MD  pravastatin (PRAVACHOL) 80 MG tablet Take 40 mg by mouth at bedtime.     Historical Provider, MD  predniSONE (DELTASONE) 10 MG tablet  09/22/12   Historical Provider, MD  triamcinolone cream (KENALOG) 0.1 %  09/15/12   Historical Provider, MD    Allergies Alendronate sodium and Simvastatin  Family History  Problem Relation Age of Onset  . Heart disease Mother   . Hypertension Mother   . Cancer Father 62    liver  . Alzheimer's disease Sister   . Hypertension Brother   . Sudden death Brother   . Hypertension Brother 69  . Heart disease Brother   . Hypertension Brother   . Alzheimer's disease Sister     Social History Social History  Substance Use Topics  . Smoking status: Never Smoker  . Smokeless tobacco: Never Used  . Alcohol use No    Review of Systems  Constitutional: Negative for fever. Cardiovascular: Positive for palpitations.  Respiratory: Negative for shortness of breath. Gastrointestinal: Negative for abdominal pain, vomiting and diarrhea. Neurological: Negative for headaches, focal weakness or numbness.  10-point ROS otherwise negative.  ____________________________________________   PHYSICAL EXAM:  VITAL SIGNS: ED Triage Vitals  Enc Vitals Group     BP 07/22/16 1654 137/65     Pulse Rate 07/22/16 1654 (!) 136     Resp 07/22/16 1654 18     Temp 07/22/16 1654 97.9 F (36.6 C)     Temp Source 07/22/16 1654 Oral     SpO2 07/22/16 1654 97 %     Weight 07/22/16 1651 101 lb (45.8 kg)     Height 07/22/16  1651 5\' 2"  (1.575 m)   Constitutional: Alert and oriented. Well appearing and in no distress. Eyes: Conjunctivae are normal. Normal extraocular movements. ENT   Head: Normocephalic and atraumatic.   Nose: No congestion/rhinnorhea.   Mouth/Throat: Mucous membranes are moist.   Neck: No stridor. Hematological/Lymphatic/Immunilogical: No cervical lymphadenopathy. Cardiovascular: Tachycardic.  Positive for systolic murmur.  Respiratory: Normal respiratory effort without tachypnea nor retractions. Breath sounds are clear and equal bilaterally. No wheezes/rales/rhonchi. Gastrointestinal: Soft and non tender. No rebound. No guarding.  Genitourinary: Deferred Musculoskeletal: Normal range of motion in all extremities. No lower extremity edema. Neurologic:  Normal speech and language. No gross focal neurologic deficits are appreciated.  Skin:  Skin is warm, dry and intact. No rash noted. Psychiatric: Mood and affect are normal. Speech and behavior are normal. Patient exhibits appropriate insight and judgment.  ____________________________________________    LABS (pertinent positives/negatives)  Labs Reviewed  BASIC METABOLIC PANEL - Abnormal; Notable for the following:       Result Value   Glucose, Bld 112 (*)    Creatinine, Ser 1.53 (*)    Calcium 8.4 (*)    GFR calc non Af Amer 29 (*)    GFR calc Af Amer 33 (*)    All other components within normal limits  CBC - Abnormal; Notable for the following:    Hemoglobin 11.4 (*)    RDW 14.9 (*)    All other components within normal limits  TROPONIN I - Abnormal; Notable for the following:    Troponin I 0.03 (*)    All other components within normal limits  TROPONIN I - Abnormal; Notable for the following:    Troponin I 0.03 (*)    All other components within normal limits     ____________________________________________   EKG  I, Nance Pear, attending physician, personally viewed and interpreted this EKG  EKG  Time: 1653 Rate: 138 Rhythm: atrial flutter with 2:1 av conduction vs sinus tachycardia Axis: left axis deviation Intervals: qtc 493 QRS: LVH ST changes: no st elevation Impression: abnormal ekg  I, Nance Pear, attending physician, personally viewed and interpreted this EKG  EKG Time: 2119 Rate: 91 Rhythm: sinus rhythm Axis: right axis deviation Intervals: qtc 491 QRS: narrow, q waves V1, V2 ST changes: no st elevation Impression: abnormal ekg   ____________________________________________    RADIOLOGY  CXR IMPRESSION: The cardiomegaly with hyperinflation. No acute cardiopulmonary findings.  ____________________________________________   PROCEDURES  Procedures  ____________________________________________   INITIAL IMPRESSION / ASSESSMENT AND PLAN / ED COURSE  Pertinent labs & imaging results that were available during my care of the patient were reviewed by me and considered in my medical decision making (see chart for details).  Patient presented to the emergency department today because of concerns for palpitations. Initial EKG did show A. fib with RVR. She was given IV metoprolol and converted back to sinus rhythm. Patient did feel better. Initial troponin was 0.03. This was repeated and remained 0.03. This point do not think patient suffering from ACS given lack of increased. Will discharge follow-up with primary care doctor.  ____________________________________________   FINAL CLINICAL IMPRESSION(S) / ED DIAGNOSES  Final diagnoses:  Atrial fibrillation with RVR ([place])     Note: This dictation was prepared with Dragon dictation. Any transcriptional errors that result from this process are unintentional     Nance Pear, MD 07/22/16 2209

## 2016-07-27 DIAGNOSIS — Z8679 Personal history of other diseases of the circulatory system: Secondary | ICD-10-CM | POA: Diagnosis not present

## 2016-07-27 DIAGNOSIS — I480 Paroxysmal atrial fibrillation: Secondary | ICD-10-CM | POA: Diagnosis not present

## 2016-07-27 DIAGNOSIS — E782 Mixed hyperlipidemia: Secondary | ICD-10-CM | POA: Diagnosis not present

## 2016-07-27 DIAGNOSIS — Z951 Presence of aortocoronary bypass graft: Secondary | ICD-10-CM | POA: Diagnosis not present

## 2016-07-27 DIAGNOSIS — I2510 Atherosclerotic heart disease of native coronary artery without angina pectoris: Secondary | ICD-10-CM | POA: Diagnosis not present

## 2016-07-27 DIAGNOSIS — N184 Chronic kidney disease, stage 4 (severe): Secondary | ICD-10-CM | POA: Diagnosis not present

## 2016-07-27 DIAGNOSIS — I10 Essential (primary) hypertension: Secondary | ICD-10-CM | POA: Diagnosis not present

## 2016-07-27 DIAGNOSIS — I350 Nonrheumatic aortic (valve) stenosis: Secondary | ICD-10-CM | POA: Diagnosis not present

## 2016-07-27 DIAGNOSIS — Z950 Presence of cardiac pacemaker: Secondary | ICD-10-CM | POA: Diagnosis not present

## 2016-08-02 DIAGNOSIS — Z79899 Other long term (current) drug therapy: Secondary | ICD-10-CM | POA: Diagnosis not present

## 2016-08-02 DIAGNOSIS — L930 Discoid lupus erythematosus: Secondary | ICD-10-CM | POA: Diagnosis not present

## 2016-08-04 DIAGNOSIS — Z5181 Encounter for therapeutic drug level monitoring: Secondary | ICD-10-CM | POA: Diagnosis not present

## 2016-08-04 DIAGNOSIS — L930 Discoid lupus erythematosus: Secondary | ICD-10-CM | POA: Diagnosis not present

## 2016-08-14 ENCOUNTER — Inpatient Hospital Stay
Admission: EM | Admit: 2016-08-14 | Discharge: 2016-08-16 | DRG: 291 | Disposition: A | Discharge status: Home / Self Care | Payer: Medicare HMO | Attending: Internal Medicine | Admitting: Internal Medicine

## 2016-08-14 ENCOUNTER — Emergency Department: Payer: Medicare HMO

## 2016-08-14 DIAGNOSIS — Z950 Presence of cardiac pacemaker: Secondary | ICD-10-CM

## 2016-08-14 DIAGNOSIS — I110 Hypertensive heart disease with heart failure: Secondary | ICD-10-CM | POA: Diagnosis not present

## 2016-08-14 DIAGNOSIS — I350 Nonrheumatic aortic (valve) stenosis: Secondary | ICD-10-CM | POA: Diagnosis present

## 2016-08-14 DIAGNOSIS — R531 Weakness: Secondary | ICD-10-CM | POA: Diagnosis not present

## 2016-08-14 DIAGNOSIS — Z820 Family history of epilepsy and other diseases of the nervous system: Secondary | ICD-10-CM

## 2016-08-14 DIAGNOSIS — J810 Acute pulmonary edema: Secondary | ICD-10-CM

## 2016-08-14 DIAGNOSIS — Z7901 Long term (current) use of anticoagulants: Secondary | ICD-10-CM | POA: Diagnosis not present

## 2016-08-14 DIAGNOSIS — I5023 Acute on chronic systolic (congestive) heart failure: Secondary | ICD-10-CM | POA: Diagnosis not present

## 2016-08-14 DIAGNOSIS — M419 Scoliosis, unspecified: Secondary | ICD-10-CM | POA: Diagnosis present

## 2016-08-14 DIAGNOSIS — E785 Hyperlipidemia, unspecified: Secondary | ICD-10-CM | POA: Diagnosis not present

## 2016-08-14 DIAGNOSIS — Z79899 Other long term (current) drug therapy: Secondary | ICD-10-CM

## 2016-08-14 DIAGNOSIS — I700 Atherosclerosis of aorta: Secondary | ICD-10-CM | POA: Diagnosis present

## 2016-08-14 DIAGNOSIS — Z86010 Personal history of colonic polyps: Secondary | ICD-10-CM | POA: Diagnosis not present

## 2016-08-14 DIAGNOSIS — R0602 Shortness of breath: Secondary | ICD-10-CM

## 2016-08-14 DIAGNOSIS — N189 Chronic kidney disease, unspecified: Secondary | ICD-10-CM | POA: Diagnosis present

## 2016-08-14 DIAGNOSIS — M3500 Sicca syndrome, unspecified: Secondary | ICD-10-CM | POA: Diagnosis present

## 2016-08-14 DIAGNOSIS — N179 Acute kidney failure, unspecified: Secondary | ICD-10-CM | POA: Diagnosis not present

## 2016-08-14 DIAGNOSIS — Z951 Presence of aortocoronary bypass graft: Secondary | ICD-10-CM

## 2016-08-14 DIAGNOSIS — M329 Systemic lupus erythematosus, unspecified: Secondary | ICD-10-CM | POA: Diagnosis present

## 2016-08-14 DIAGNOSIS — I480 Paroxysmal atrial fibrillation: Secondary | ICD-10-CM | POA: Diagnosis present

## 2016-08-14 DIAGNOSIS — K5900 Constipation, unspecified: Secondary | ICD-10-CM | POA: Diagnosis present

## 2016-08-14 DIAGNOSIS — I429 Cardiomyopathy, unspecified: Secondary | ICD-10-CM | POA: Diagnosis present

## 2016-08-14 DIAGNOSIS — Z888 Allergy status to other drugs, medicaments and biological substances status: Secondary | ICD-10-CM

## 2016-08-14 DIAGNOSIS — E039 Hypothyroidism, unspecified: Secondary | ICD-10-CM | POA: Diagnosis present

## 2016-08-14 DIAGNOSIS — I10 Essential (primary) hypertension: Secondary | ICD-10-CM | POA: Diagnosis not present

## 2016-08-14 DIAGNOSIS — I2510 Atherosclerotic heart disease of native coronary artery without angina pectoris: Secondary | ICD-10-CM | POA: Diagnosis present

## 2016-08-14 DIAGNOSIS — K219 Gastro-esophageal reflux disease without esophagitis: Secondary | ICD-10-CM | POA: Diagnosis present

## 2016-08-14 DIAGNOSIS — Z9861 Coronary angioplasty status: Secondary | ICD-10-CM

## 2016-08-14 DIAGNOSIS — M19049 Primary osteoarthritis, unspecified hand: Secondary | ICD-10-CM | POA: Diagnosis present

## 2016-08-14 DIAGNOSIS — I130 Hypertensive heart and chronic kidney disease with heart failure and stage 1 through stage 4 chronic kidney disease, or unspecified chronic kidney disease: Principal | ICD-10-CM | POA: Diagnosis present

## 2016-08-14 DIAGNOSIS — I509 Heart failure, unspecified: Secondary | ICD-10-CM

## 2016-08-14 DIAGNOSIS — F411 Generalized anxiety disorder: Secondary | ICD-10-CM | POA: Diagnosis present

## 2016-08-14 DIAGNOSIS — Z808 Family history of malignant neoplasm of other organs or systems: Secondary | ICD-10-CM

## 2016-08-14 DIAGNOSIS — Z952 Presence of prosthetic heart valve: Secondary | ICD-10-CM

## 2016-08-14 DIAGNOSIS — I5021 Acute systolic (congestive) heart failure: Secondary | ICD-10-CM | POA: Diagnosis not present

## 2016-08-14 DIAGNOSIS — Z8249 Family history of ischemic heart disease and other diseases of the circulatory system: Secondary | ICD-10-CM

## 2016-08-14 LAB — CBC
HCT: 32.9 % — ABNORMAL LOW (ref 35.0–47.0)
HEMOGLOBIN: 10.8 g/dL — AB (ref 12.0–16.0)
MCH: 29.8 pg (ref 26.0–34.0)
MCHC: 32.9 g/dL (ref 32.0–36.0)
MCV: 90.8 fL (ref 80.0–100.0)
PLATELETS: 187 10*3/uL (ref 150–440)
RBC: 3.63 MIL/uL — AB (ref 3.80–5.20)
RDW: 14.4 % (ref 11.5–14.5)
WBC: 6.7 10*3/uL (ref 3.6–11.0)

## 2016-08-14 LAB — BASIC METABOLIC PANEL
ANION GAP: 7 (ref 5–15)
BUN: 22 mg/dL — ABNORMAL HIGH (ref 6–20)
CALCIUM: 8.4 mg/dL — AB (ref 8.9–10.3)
CHLORIDE: 105 mmol/L (ref 101–111)
CO2: 23 mmol/L (ref 22–32)
CREATININE: 1.81 mg/dL — AB (ref 0.44–1.00)
GFR calc non Af Amer: 23 mL/min — ABNORMAL LOW (ref >60)
GFR, EST AFRICAN AMERICAN: 27 mL/min — AB (ref >60)
Glucose, Bld: 95 mg/dL (ref 65–99)
Potassium: 4.2 mmol/L (ref 3.5–5.1)
SODIUM: 135 mmol/L (ref 135–145)

## 2016-08-14 LAB — TROPONIN I

## 2016-08-14 NOTE — ED Triage Notes (Signed)
Patient reports feeling short of breath.  States "I haven't felt good since yesterday."

## 2016-08-15 ENCOUNTER — Inpatient Hospital Stay
Admit: 2016-08-15 | Discharge: 2016-08-15 | Disposition: A | Discharge status: Home / Self Care | Payer: Medicare HMO | Attending: Internal Medicine | Admitting: Internal Medicine

## 2016-08-15 DIAGNOSIS — R0602 Shortness of breath: Secondary | ICD-10-CM | POA: Diagnosis present

## 2016-08-15 DIAGNOSIS — I429 Cardiomyopathy, unspecified: Secondary | ICD-10-CM | POA: Diagnosis present

## 2016-08-15 DIAGNOSIS — I2510 Atherosclerotic heart disease of native coronary artery without angina pectoris: Secondary | ICD-10-CM | POA: Diagnosis present

## 2016-08-15 DIAGNOSIS — I480 Paroxysmal atrial fibrillation: Secondary | ICD-10-CM | POA: Diagnosis present

## 2016-08-15 DIAGNOSIS — Z86010 Personal history of colonic polyps: Secondary | ICD-10-CM | POA: Diagnosis not present

## 2016-08-15 DIAGNOSIS — Z79899 Other long term (current) drug therapy: Secondary | ICD-10-CM | POA: Diagnosis not present

## 2016-08-15 DIAGNOSIS — I5023 Acute on chronic systolic (congestive) heart failure: Secondary | ICD-10-CM | POA: Diagnosis present

## 2016-08-15 DIAGNOSIS — N189 Chronic kidney disease, unspecified: Secondary | ICD-10-CM | POA: Diagnosis present

## 2016-08-15 DIAGNOSIS — I700 Atherosclerosis of aorta: Secondary | ICD-10-CM | POA: Diagnosis present

## 2016-08-15 DIAGNOSIS — I130 Hypertensive heart and chronic kidney disease with heart failure and stage 1 through stage 4 chronic kidney disease, or unspecified chronic kidney disease: Secondary | ICD-10-CM | POA: Diagnosis present

## 2016-08-15 DIAGNOSIS — M19049 Primary osteoarthritis, unspecified hand: Secondary | ICD-10-CM | POA: Diagnosis present

## 2016-08-15 DIAGNOSIS — M3500 Sicca syndrome, unspecified: Secondary | ICD-10-CM | POA: Diagnosis present

## 2016-08-15 DIAGNOSIS — M329 Systemic lupus erythematosus, unspecified: Secondary | ICD-10-CM | POA: Diagnosis present

## 2016-08-15 DIAGNOSIS — F411 Generalized anxiety disorder: Secondary | ICD-10-CM | POA: Diagnosis present

## 2016-08-15 DIAGNOSIS — K219 Gastro-esophageal reflux disease without esophagitis: Secondary | ICD-10-CM | POA: Diagnosis present

## 2016-08-15 DIAGNOSIS — N179 Acute kidney failure, unspecified: Secondary | ICD-10-CM | POA: Diagnosis present

## 2016-08-15 DIAGNOSIS — R531 Weakness: Secondary | ICD-10-CM | POA: Diagnosis present

## 2016-08-15 DIAGNOSIS — I350 Nonrheumatic aortic (valve) stenosis: Secondary | ICD-10-CM | POA: Diagnosis present

## 2016-08-15 DIAGNOSIS — E039 Hypothyroidism, unspecified: Secondary | ICD-10-CM | POA: Diagnosis present

## 2016-08-15 DIAGNOSIS — Z7901 Long term (current) use of anticoagulants: Secondary | ICD-10-CM | POA: Diagnosis not present

## 2016-08-15 DIAGNOSIS — Z950 Presence of cardiac pacemaker: Secondary | ICD-10-CM | POA: Diagnosis not present

## 2016-08-15 DIAGNOSIS — K5900 Constipation, unspecified: Secondary | ICD-10-CM | POA: Diagnosis present

## 2016-08-15 DIAGNOSIS — Z888 Allergy status to other drugs, medicaments and biological substances status: Secondary | ICD-10-CM | POA: Diagnosis not present

## 2016-08-15 DIAGNOSIS — E785 Hyperlipidemia, unspecified: Secondary | ICD-10-CM | POA: Diagnosis present

## 2016-08-15 DIAGNOSIS — Z951 Presence of aortocoronary bypass graft: Secondary | ICD-10-CM | POA: Diagnosis not present

## 2016-08-15 LAB — URINALYSIS, COMPLETE (UACMP) WITH MICROSCOPIC
Bacteria, UA: NONE SEEN
Bilirubin Urine: NEGATIVE
GLUCOSE, UA: NEGATIVE mg/dL
HGB URINE DIPSTICK: NEGATIVE
Ketones, ur: NEGATIVE mg/dL
Leukocytes, UA: NEGATIVE
Nitrite: NEGATIVE
PH: 7 (ref 5.0–8.0)
Protein, ur: NEGATIVE mg/dL
SPECIFIC GRAVITY, URINE: 1.005 (ref 1.005–1.030)
Squamous Epithelial / LPF: NONE SEEN

## 2016-08-15 LAB — BRAIN NATRIURETIC PEPTIDE: B Natriuretic Peptide: 2701 pg/mL — ABNORMAL HIGH (ref 0.0–100.0)

## 2016-08-15 LAB — ECHOCARDIOGRAM COMPLETE

## 2016-08-15 LAB — TROPONIN I: Troponin I: <0.03 ng/mL (ref <0.03)

## 2016-08-15 LAB — TSH: TSH: 3.218 u[IU]/mL (ref 0.350–4.500)

## 2016-08-15 MED ORDER — POTASSIUM CHLORIDE CRYS ER 20 MEQ PO TBCR
20.0000 meq | EXTENDED_RELEASE_TABLET | Freq: Two times a day (BID) | ORAL | Status: DC
  Administered 2016-08-15 – 2016-08-16 (×3): 20 meq via ORAL
  Filled 2016-08-15 (×3): qty 1

## 2016-08-15 MED ORDER — ALPRAZOLAM 0.25 MG PO TABS
0.2500 mg | ORAL_TABLET | Freq: Every day | ORAL | Status: DC
  Administered 2016-08-15: 0.25 mg via ORAL
  Filled 2016-08-15: qty 1

## 2016-08-15 MED ORDER — DILTIAZEM HCL ER BEADS 180 MG PO CP24
180.0000 mg | ORAL_CAPSULE | Freq: Every day | ORAL | Status: DC
  Filled 2016-08-15: qty 1

## 2016-08-15 MED ORDER — DILTIAZEM HCL ER BEADS 180 MG PO CP24
180.0000 mg | ORAL_CAPSULE | Freq: Every day | ORAL | Status: DC
  Administered 2016-08-15: 180 mg via ORAL
  Filled 2016-08-15: qty 1

## 2016-08-15 MED ORDER — ACETAMINOPHEN 650 MG RE SUPP: 650.0000 mg | Freq: Four times a day (QID) | RECTAL | Status: DC | PRN

## 2016-08-15 MED ORDER — APIXABAN 2.5 MG PO TABS
2.5000 mg | ORAL_TABLET | Freq: Every day | ORAL | Status: DC
  Administered 2016-08-15 – 2016-08-16 (×2): 2.5 mg via ORAL
  Filled 2016-08-15 (×2): qty 1

## 2016-08-15 MED ORDER — LEVOTHYROXINE SODIUM 50 MCG PO TABS
50.0000 ug | ORAL_TABLET | Freq: Every day | ORAL | Status: DC
  Administered 2016-08-15 – 2016-08-16 (×2): 50 ug via ORAL
  Filled 2016-08-15 (×2): qty 1

## 2016-08-15 MED ORDER — ACETAMINOPHEN 325 MG PO TABS: 650.0000 mg | ORAL_TABLET | Freq: Four times a day (QID) | ORAL | Status: DC | PRN

## 2016-08-15 MED ORDER — METOPROLOL SUCCINATE ER 25 MG PO TB24
25.0000 mg | ORAL_TABLET | Freq: Every day | ORAL | Status: DC
  Administered 2016-08-15 – 2016-08-16 (×2): 25 mg via ORAL
  Filled 2016-08-15 (×2): qty 1

## 2016-08-15 MED ORDER — ONDANSETRON HCL 4 MG PO TABS: 4.0000 mg | ORAL_TABLET | Freq: Four times a day (QID) | ORAL | Status: DC | PRN

## 2016-08-15 MED ORDER — VITAMIN B-12 1000 MCG PO TABS
1000.0000 ug | ORAL_TABLET | Freq: Every day | ORAL | Status: DC
  Administered 2016-08-15 – 2016-08-16 (×2): 1000 ug via ORAL
  Filled 2016-08-15 (×2): qty 1

## 2016-08-15 MED ORDER — FUROSEMIDE 20 MG PO TABS: 20.0000 mg | ORAL_TABLET | Freq: Two times a day (BID) | ORAL | Status: DC

## 2016-08-15 MED ORDER — POLYVINYL ALCOHOL 1.4 % OP SOLN
1.0000 [drp] | Freq: Two times a day (BID) | OPHTHALMIC | Status: DC | PRN
  Administered 2016-08-15: 1 [drp] via OPHTHALMIC
  Filled 2016-08-15: qty 15

## 2016-08-15 MED ORDER — SODIUM CHLORIDE 0.9% FLUSH: 3.0000 mL | INTRAVENOUS | Status: DC | PRN

## 2016-08-15 MED ORDER — DILTIAZEM HCL ER BEADS 180 MG PO CP24: 180.0000 mg | ORAL_CAPSULE | Freq: Every day | ORAL | Status: DC

## 2016-08-15 MED ORDER — FUROSEMIDE 10 MG/ML IJ SOLN
40.0000 mg | Freq: Four times a day (QID) | INTRAMUSCULAR | Status: DC
  Administered 2016-08-15: 40 mg via INTRAVENOUS
  Filled 2016-08-15: qty 4

## 2016-08-15 MED ORDER — FUROSEMIDE 10 MG/ML IJ SOLN
40.0000 mg | Freq: Once | INTRAMUSCULAR | Status: AC
  Administered 2016-08-15: 40 mg via INTRAVENOUS
  Filled 2016-08-15: qty 4

## 2016-08-15 MED ORDER — CYCLOSPORINE 0.05 % OP EMUL
1.0000 [drp] | Freq: Two times a day (BID) | OPHTHALMIC | Status: DC
  Administered 2016-08-15 – 2016-08-16 (×2): 1 [drp] via OPHTHALMIC
  Filled 2016-08-15 (×3): qty 1

## 2016-08-15 MED ORDER — ONDANSETRON HCL 4 MG/2ML IJ SOLN: 4.0000 mg | Freq: Four times a day (QID) | INTRAMUSCULAR | Status: DC | PRN

## 2016-08-15 MED ORDER — HYPROMELLOSE (GONIOSCOPIC) 2.5 % OP SOLN: 1.0000 [drp] | Freq: Two times a day (BID) | OPHTHALMIC | Status: DC | PRN

## 2016-08-15 MED ORDER — PRAVASTATIN SODIUM 40 MG PO TABS
40.0000 mg | ORAL_TABLET | Freq: Every day | ORAL | Status: DC
  Administered 2016-08-15: 40 mg via ORAL
  Filled 2016-08-15: qty 1

## 2016-08-15 MED ORDER — SODIUM CHLORIDE 0.9% FLUSH
3.0000 mL | Freq: Two times a day (BID) | INTRAVENOUS | Status: DC
  Administered 2016-08-15 – 2016-08-16 (×2): 3 mL via INTRAVENOUS

## 2016-08-15 MED ORDER — DOCUSATE SODIUM 100 MG PO CAPS
100.0000 mg | ORAL_CAPSULE | Freq: Two times a day (BID) | ORAL | Status: DC
  Administered 2016-08-15 – 2016-08-16 (×3): 100 mg via ORAL
  Filled 2016-08-15 (×3): qty 1

## 2016-08-15 MED ORDER — FERROUS SULFATE 325 (65 FE) MG PO TABS
325.0000 mg | ORAL_TABLET | Freq: Every day | ORAL | Status: DC
  Administered 2016-08-15: 325 mg via ORAL
  Filled 2016-08-15: qty 1

## 2016-08-15 MED ORDER — FUROSEMIDE 10 MG/ML IJ SOLN
40.0000 mg | Freq: Two times a day (BID) | INTRAMUSCULAR | Status: DC
  Administered 2016-08-15: 40 mg via INTRAVENOUS
  Filled 2016-08-15: qty 4

## 2016-08-15 NOTE — ED Notes (Signed)
Ambulated patient per MD request. Patient's heart rate increased from 81 to 103, patient's respiratory rate increased from 19 to 40, patient's oxygen saturation decreased from 97 to 91%.

## 2016-08-15 NOTE — Progress Notes (Signed)
Patient admitted to room 254 from the ED. A&o x4, VSS, patient's sister at the bedside. Heart monitor 40-20 applied to patient, verified with CCMD. Head to toe skin assessment completed with Vicenta Dunning, RN - see flow sheet for details. Patient resting comfortably with no complaints at this time.

## 2016-08-15 NOTE — Progress Notes (Signed)
[facility] [place] NAME: [name]    MR#:  [id number]  DATE OF BIRTH:  [birth year]  SUBJECTIVE:  CHIEF COMPLAINT:  Patient's shortness of breath significantly improved, walking to the bathroom without any difficulty according to the RNs report  REVIEW OF SYSTEMS:  CONSTITUTIONAL: No fever, fatigue or weakness.  EYES: No blurred or double vision.  EARS, NOSE, AND THROAT: No tinnitus or ear pain.  RESPIRATORY: No cough, shortness of breath, wheezing or hemoptysis.  CARDIOVASCULAR: No chest pain, orthopnea, edema.  GASTROINTESTINAL: No nausea, vomiting, diarrhea or abdominal pain.  GENITOURINARY: No dysuria, hematuria.  ENDOCRINE: No polyuria, nocturia,  HEMATOLOGY: No anemia, easy bruising or bleeding SKIN: No rash or lesion. MUSCULOSKELETAL: No joint pain or arthritis.   NEUROLOGIC: No tingling, numbness, weakness.  PSYCHIATRY: No anxiety or depression.   DRUG ALLERGIES:   Allergies  Allergen Reactions  . Alendronate Sodium Other (See Comments)    REACTION: GI upset  . Simvastatin Other (See Comments)    Feet feel heavy    VITALS:  Blood pressure 132/60, pulse 87, temperature 97.9 F (36.6 C), resp. rate 14, height 5\' 2"  (1.575 m), weight 43.5 kg (96 lb), SpO2 95 %.  PHYSICAL EXAMINATION:  GENERAL:  83 y.o.-year-old patient lying in the bed with no acute distress.  EYES: Pupils equal, round, reactive to light and accommodation. No scleral icterus. Extraocular muscles intact.  HEENT: Head atraumatic, normocephalic. Oropharynx and nasopharynx clear.  NECK:  Supple, no jugular venous distention. No thyroid enlargement, no tenderness.  LUNGS: Normal breath sounds bilaterally, no wheezing, rales,rhonchi or crepitation. No use of accessory muscles of respiration.  CARDIOVASCULAR: S1, S2 normal. No murmurs, rubs, or gallops.  ABDOMEN: Soft, nontender, nondistended. Bowel sounds present. No organomegaly or mass.  EXTREMITIES:  No pedal edema, cyanosis, or clubbing.  NEUROLOGIC: Cranial nerves II through XII are intact. Muscle strength 5/5 in all extremities. Sensation intact. Gait not checked.  PSYCHIATRIC: The patient is alert and oriented x 3.  SKIN: No obvious rash, lesion, or ulcer.    LABORATORY PANEL:   CBC  Recent Labs Lab 08/14/16 1932  WBC 6.7  HGB 10.8*  HCT 32.9*  PLT 187   ------------------------------------------------------------------------------------------------------------------  Chemistries   Recent Labs Lab 08/14/16 1932  NA 135  K 4.2  CL 105  CO2 23  GLUCOSE 95  BUN 22*  CREATININE 1.81*  CALCIUM 8.4*   ------------------------------------------------------------------------------------------------------------------  Cardiac Enzymes  Recent Labs Lab 08/15/16 0125  TROPONINI <0.03   ------------------------------------------------------------------------------------------------------------------  RADIOLOGY:  Dg Chest 2 View  Result Date: 08/14/2016 CLINICAL DATA:  Patient reports feeling short of breath. States "I haven't felt good since yesterday." EXAM: CHEST  2 VIEW COMPARISON:  07/22/2016 FINDINGS: Prior median sternotomy. Osteopenia. Pacer with leads at right atrium and right ventricle. No lead discontinuity. Patient rotated left. Midline trachea. Moderate cardiomegaly. Atherosclerosis in the transverse aorta. Trace left-sided pleural fluid or thickening. No pneumothorax. Similar mild interstitial thickening. No lobar consolidation. Medial left base scarring. IMPRESSION: Cardiomegaly with similar mild interstitial thickening. This is suspicious for mild pulmonary venous congestion. trace left pleural fluid or thickening. Aortic atherosclerosis. Electronically Signed   By: Abigail Miyamoto M.D.   On: 08/14/2016 20:12    EKG:   Orders placed or performed during the hospital encounter of 08/14/16  . ED EKG  . ED EKG    ASSESSMENT AND PLAN:   This is a  83 year old female admitted for CHF exacerbation.  1. CHF:  Acute on chronic; systolic. Clinically improving with IV Lasix, seen by cardiology , continue IV Lasix 40 mg twice a day and switch to by mouth Lasix 20 g twice a day. Monitor electrolytes. Echocardiogram with ejection fraction 30-35%. Has severe aortic stenosis 2. Acute kidney injury: Patient is on Lasix IV changed to by mouth in a.m. Check a.m. labs Avoid nephrotoxic agents. Improve cardiac output. Gentle diuresis. Creatinine 1.53-1.81(close to her baseline) 3. Coronary artery disease: Stable; continue aspirin,statin 4. Hypertension: Continue metoprolol and Lasix and titrate as needed  5. Atrial fibrillation: Rate controlled; continue diltiazem and Eliquis 6. Hypothyroidism: Check TSH; continue Synthroid 7. Hyperlipidemia: Continue statin therapy 8. GI prophylaxis: None 9. DVT prophylaxis: eliquis     All the records are reviewed and case discussed with Care Management/Social Workerr. Management plans discussed with the patient, family and they are in agreement.  CODE STATUS: fc   TOTAL TIME TAKING CARE OF THIS PATIENT: 84minutes.   POSSIBLE D/C IN 1-2  DAYS, DEPENDING ON CLINICAL CONDITION.  Note: This dictation was prepared with Dragon dictation along with smaller phrase technology. Any transcriptional errors that result from this process are unintentional.   Nicholes Mango M.D on 08/15/2016 at 2:06 PM  Between 7am to 6pm - Pager - [phone] After 6pm go to [email or web address] - password [email or web address]  Office  [phone]  CC: Primary care physician; Rusty Aus, MD

## 2016-08-15 NOTE — H&P (Signed)
[name] is an 83 y.o. female.   Chief Complaint: Shortness of breath HPI: The patient with past medical history of congestive heart failure and coronary artery disease status post CABG and valve replacement presents to the emergency department complaining of shortness of breath. The patient states that she has intermittent episodes of shortness of breath the last of which was yesterday. It began in the morning. She called her primary care doctor who recommended taking Lasix as well as a potassium tablet. Her dyspnea improved for a brief period of time but returned in the evening. She denies orthopnea. In the emergency department BNP was dramatically elevated and pulmonary edema was seen on chest x-ray. The patient received Lasix IV and had good urine output. She felt better but upon ambulation respiratory rate increased to the 40s and her oximetry decreased to the 80s. By the time of my interview the patient was on room air but still felt very weak. She denies chest pain. Due to ongoing dyspnea emergency department staff called the hospitalist service for admission.  Past Medical History:  Diagnosis Date  . Allergic rhinitis   . Anemia   . Anginal pain ([place])   . Coronary artery disease   . GERD (gastroesophageal reflux disease)   . Heart murmur   . Hyperlipidemia   . Hypertension   . Osteoarthritis of hand   . Osteopenia   . Recurrent pleural effusion on left   . Shortness of breath    "since the heart OR in 05/2012 cause of fluid" (08/17/2012)    Past Surgical History:  Procedure Laterality Date  . BREAST CYST ASPIRATION Left ~ 1948   "benign" (08/17/2012)  . carotid testing negative    . CHEST TUBE INSERTION Left 08/17/2012   Procedure: INSERTION PLEURAL DRAINAGE CATHETER;  Surgeon: Melrose Nakayama, MD;  Location: [facility];  Service: Thoracic;  Laterality: Left;  . COLONOSCOPY  9/02   polyp, hematochezia adenoma due in 5 yrs.  . CORONARY ANGIOPLASTY    . CORONARY ARTERY BYPASS GRAFT  N/A 06/08/2012   Procedure: CORONARY ARTERY BYPASS GRAFTING (CABG);  Surgeon: Melrose Nakayama, MD;  Location: [facility];  Service: Open Heart Surgery;  Laterality: N/A;  . DEXA OP  03/02  . INSERTION / PLACEMENT PLEURAL CATHETER Left 08/17/2012  . stress Echo negative  7/04  . TEE WITHOUT CARDIOVERSION N/A 06/08/2012   Procedure: TRANSESOPHAGEAL ECHOCARDIOGRAM (TEE);  Surgeon: Melrose Nakayama, MD;  Location: [facility];  Service: Open Heart Surgery;  Laterality: N/A;  . TSpine XRay Scoliosis  11/09/2004   mild thoracic    Family History  Problem Relation Age of Onset  . Heart disease Mother   . Hypertension Mother   . Cancer Father 12    liver  . Alzheimer's disease Sister   . Hypertension Brother   . Sudden death Brother   . Hypertension Brother 15  . Heart disease Brother   . Hypertension Brother   . Alzheimer's disease Sister    Social History:  reports that she has never smoked. She has never used smokeless tobacco. She reports that she does not drink alcohol or use drugs.  Allergies:  Allergies  Allergen Reactions  . Alendronate Sodium Other (See Comments)    REACTION: GI upset  . Simvastatin Other (See Comments)    Feet feel heavy    Prior to Admission medications   Medication Sig Start Date End Date Taking? Authorizing Provider  acetaminophen (TYLENOL) 500 MG tablet Take 500 mg  by mouth 2 (two) times daily. 06/13/12  Yes Barrett, Erin R, PA-C  ALPRAZolam (XANAX) 0.25 MG tablet Take 0.25 mg by mouth at bedtime.    Yes [provider]  apixaban (ELIQUIS) 2.5 MG TABS tablet Take 1 tablet by mouth daily.   Yes [provider]  cycloSPORINE (RESTASIS) 0.05 % ophthalmic emulsion Place 1 drop into both eyes 2 (two) times daily.   Yes [provider]  diltiazem (TIAZAC) 180 MG 24 hr capsule Take 1 capsule by mouth daily. 05/05/16  Yes [provider]  ferrous sulfate 325 (65 FE) MG tablet Take 325 mg by mouth daily at 12 noon.   Yes [provider]  furosemide (LASIX) 20 MG tablet Take 1 tablet by mouth 2 (two) times daily. 06/29/16  Yes [provider]  hydroxypropyl methylcellulose (ISOPTO TEARS) 2.5 % ophthalmic solution Place 1 drop into both eyes 2 (two) times daily as needed (dry eyes).   Yes [provider]  levothyroxine (SYNTHROID, LEVOTHROID) 50 MCG tablet Take 50 mcg by mouth daily before breakfast.  07/24/12  Yes [provider]  magnesium hydroxide (MILK OF MAGNESIA) 400 MG/5ML suspension Take 10 mLs by mouth daily as needed for constipation.   Yes [provider]  metoprolol succinate (TOPROL-XL) 25 MG 24 hr tablet Take 25 mg by mouth daily.   Yes [provider]  polyethylene glycol powder (GLYCOLAX/MIRALAX) powder  09/15/12  Yes [provider]  potassium chloride SA (K-DUR,KLOR-CON) 20 MEQ tablet Take 20 mEq by mouth 2 (two) times daily.  09/18/12  Yes [provider]  pravastatin (PRAVACHOL) 40 MG tablet Take 40 mg by mouth at bedtime.    Yes [provider]  vitamin B-12 (CYANOCOBALAMIN) 1000 MCG tablet Take 1,000 mcg by mouth daily.   Yes [provider]     Results for orders placed or performed during the hospital encounter of 08/14/16 (from the past 48 hour(s))  Basic metabolic panel     Status: Abnormal   Collection Time: 08/14/16  7:32 PM  Result Value Ref Range   Sodium 135 135 - 145 mmol/L   Potassium 4.2 3.5 - 5.1 mmol/L   Chloride 105 101 - 111 mmol/L   CO2 23 22 - 32 mmol/L   Glucose, Bld 95 65 - 99 mg/dL   BUN 22 (H) 6 - 20 mg/dL   Creatinine, Ser 1.81 (H) 0.44 - 1.00 mg/dL   Calcium 8.4 (L) 8.9 - 10.3 mg/dL   GFR calc non Af Amer 23 (L) >60 mL/min   GFR calc Af Amer 27 (L) >60 mL/min    Comment: (NOTE) The eGFR has been calculated using the CKD EPI equation. This calculation has not been validated in all clinical situations. eGFR's persistently <60 mL/min signify possible Chronic Kidney Disease.    Anion gap  7 5 - 15  CBC     Status: Abnormal   Collection Time: 08/14/16  7:32 PM  Result Value Ref Range   WBC 6.7 3.6 - 11.0 K/uL   RBC 3.63 (L) 3.80 - 5.20 MIL/uL   Hemoglobin 10.8 (L) 12.0 - 16.0 g/dL   HCT 32.9 (L) 35.0 - 47.0 %   MCV 90.8 80.0 - 100.0 fL   MCH 29.8 26.0 - 34.0 pg   MCHC 32.9 32.0 - 36.0 g/dL   RDW 14.4 11.5 - 14.5 %   Platelets 187 150 - 440 K/uL  Troponin I     Status: None   Collection Time:  08/14/16  7:32 PM  Result Value Ref Range   Troponin I <0.03 <0.03 ng/mL  Brain natriuretic peptide     Status: Abnormal   Collection Time: 08/14/16  7:32 PM  Result Value Ref Range   B Natriuretic Peptide 2,701.0 (H) 0.0 - 100.0 pg/mL  Urinalysis, Complete w Microscopic     Status: Abnormal   Collection Time: 08/15/16  1:25 AM  Result Value Ref Range   Color, Urine STRAW (A) YELLOW   APPearance CLEAR (A) CLEAR   Specific Gravity, Urine 1.005 1.005 - 1.030   pH 7.0 5.0 - 8.0   Glucose, UA NEGATIVE NEGATIVE mg/dL   Hgb urine dipstick NEGATIVE NEGATIVE   Bilirubin Urine NEGATIVE NEGATIVE   Ketones, ur NEGATIVE NEGATIVE mg/dL   Protein, ur NEGATIVE NEGATIVE mg/dL   Nitrite NEGATIVE NEGATIVE   Leukocytes, UA NEGATIVE NEGATIVE   RBC / HPF 0-5 0 - 5 RBC/hpf   WBC, UA 0-5 0 - 5 WBC/hpf   Bacteria, UA NONE SEEN NONE SEEN   Squamous Epithelial / LPF NONE SEEN NONE SEEN  Troponin I     Status: None   Collection Time: 08/15/16  1:25 AM  Result Value Ref Range   Troponin I <0.03 <0.03 ng/mL   Dg Chest 2 View  Result Date: 08/14/2016 CLINICAL DATA:  Patient reports feeling short of breath. States "I haven't felt good since yesterday." EXAM: CHEST  2 VIEW COMPARISON:  07/22/2016 FINDINGS: Prior median sternotomy. Osteopenia. Pacer with leads at right atrium and right ventricle. No lead discontinuity. Patient rotated left. Midline trachea. Moderate cardiomegaly. Atherosclerosis in the transverse aorta. Trace left-sided pleural fluid or thickening. No pneumothorax. Similar mild  interstitial thickening. No lobar consolidation. Medial left base scarring. IMPRESSION: Cardiomegaly with similar mild interstitial thickening. This is suspicious for mild pulmonary venous congestion. trace left pleural fluid or thickening. Aortic atherosclerosis. Electronically Signed   By: Abigail Miyamoto M.D.   On: 08/14/2016 20:12    Review of Systems  Constitutional: Positive for malaise/fatigue. Negative for chills and fever.  HENT: Negative for sore throat and tinnitus.   Eyes: Negative for blurred vision and redness.  Respiratory: Positive for shortness of breath. Negative for cough.   Cardiovascular: Negative for chest pain, palpitations, orthopnea and PND.  Gastrointestinal: Negative for abdominal pain, diarrhea, nausea and vomiting.  Genitourinary: Negative for dysuria, frequency and urgency.  Musculoskeletal: Negative for joint pain and myalgias.  Skin: Negative for rash.       No lesions  Neurological: Negative for speech change, focal weakness and weakness.  Endo/Heme/Allergies: Does not bruise/bleed easily.       No temperature intolerance  Psychiatric/Behavioral: Negative for depression and suicidal ideas.    Blood pressure (!) 167/60, pulse 86, temperature 97.8 F (36.6 C), temperature source Oral, resp. rate (!) 23, SpO2 92 %. Physical Exam  Vitals reviewed. Constitutional: She is oriented to person, place, and time. She appears well-developed and well-nourished. No distress.  HENT:  Head: Normocephalic and atraumatic.  Mouth/Throat: Oropharynx is clear and moist.  Eyes: Conjunctivae and EOM are normal. Pupils are equal, round, and reactive to light. No scleral icterus.  Neck: Normal range of motion. Neck supple. No JVD present. No tracheal deviation present. No thyromegaly present.  Cardiovascular: Normal rate and regular rhythm.  Exam reveals no gallop and no friction rub.   Murmur heard. Respiratory: Effort normal and breath sounds normal.  GI: Soft. Bowel sounds  are normal. She exhibits no distension. There is no tenderness.  Genitourinary:  Genitourinary Comments: Deferred  Musculoskeletal: Normal range of motion. She exhibits no edema.  Lymphadenopathy:    She has no cervical adenopathy.  Neurological: She is alert and oriented to person, place, and time. No cranial nerve deficit. She exhibits normal muscle tone.  Skin: Skin is warm and dry. No rash noted. No erythema.  Psychiatric: She has a normal mood and affect. Her behavior is normal. Judgment and thought content normal.     Assessment/Plan This is a 83 year old female admitted for CHF exacerbation. 1. CHF: Acute on chronic; systolic. Lungs clear on exam following IV Lasix. The patient never has lower extremity edema nor abdominal edema. With this episode she also denies orthopnea however BNP is very elevated indicating need for diuresis. Heart murmur may be consistent with mitral stenosis. Check echocardiogram and consult cardiology. Continue Lasix IV; transition to by mouth tomorrow. 2. Acute kidney injury: Likely due to decreased prerenal flow. Avoid nephrotoxic agents. Improve cardiac output. Gentle diuresis. 3. Coronary artery disease: Stable; continue aspirin 4. Hypertension: Elevated but normally acceptable for age. However she may benefit from decrease afterload in light of her CHF. Continue current dose of metoprolol. She has room to increase dose however we must also keep in mind that part of her fatigue may be secondary to beta blocker therapy as she just restarted metoprolol approximately 2 weeks ago.  5. Atrial fibrillation: Rate controlled; continue diltiazem and Eliquis 6. Hypothyroidism: Check TSH; continue Synthroid 7. Hyperlipidemia: Continue statin therapy 8. GI prophylaxis: None 9. DVT prophylaxis: As above The patient is a full code. Time spent on admission orders and patient care approximately 45 minutes  Harrie Foreman, MD 08/15/2016, 7:20 AM

## 2016-08-15 NOTE — ED Notes (Signed)
Patient informed RN that she voided while RN out of room. Patient again reminded that urine specimen is still needed. Pt verbalized understanding. Pt reports she will alert RN will able to provide a specimen.

## 2016-08-15 NOTE — Consult Note (Signed)
[facility]  CARDIOLOGY CONSULT NOTE  Patient ID: [name] MRN: [id number] DOB/AGE: 81-Apr-1925 81 y.o.  Admit date: 08/14/2016 Referring Physician Dr. Margaretmary Eddy Primary Physician Dr. Rusty Aus Primary Cardiologist Dr. Saralyn Pilar Reason for Consultation chf and chest pain  HPI: Pt is a 83 yo female with history of cardiomyopathy, aortic stenosis, cad s/p cabg times three in 2014 with a lima to the lad, svg to om1, svg to pda, history of paroxysmal afib, sss s/p dual chamber pacemaker, ckd and hyperlipidemia who was admitted with complaints of increasing sob. CXR revealed cardiomegaly with possible mild pulmonary venous congestion.EKG showed nsr. Echo was ordered and revealed reuced lv funciton at 30-35%, down somewhat from previous echo which was 45-50%. She also was noted to have severe aortic stenosis which is chronic for her. She has diuresed fairly well and is currently doing well with less sob. She has ruled out for an mi. Despite her age she is fairly funcitonal and lives by her self and still drives.   Review of Systems  Constitutional: Positive for malaise/fatigue.  HENT: Negative.   Eyes: Negative.   Respiratory: Positive for shortness of breath.   Cardiovascular: Negative.   Gastrointestinal: Negative.   Genitourinary: Negative.   Musculoskeletal: Negative.   Skin: Negative.   Neurological: Negative.   Endo/Heme/Allergies: Negative.   Psychiatric/Behavioral: Negative.     Past Medical History:  Diagnosis Date  . Allergic rhinitis   . Anemia   . Anginal pain ([place])   . Coronary artery disease   . GERD (gastroesophageal reflux disease)   . Heart murmur   . Hyperlipidemia   . Hypertension   . Osteoarthritis of hand   . Osteopenia   . Recurrent pleural effusion on left   . Shortness of breath    "since the heart OR in 05/2012 cause of fluid" (08/17/2012)    Family History  Problem Relation Age of Onset  . Heart  disease Mother   . Hypertension Mother   . Cancer Father 106    liver  . Alzheimer's disease Sister   . Hypertension Brother   . Sudden death Brother   . Hypertension Brother 16  . Heart disease Brother   . Hypertension Brother   . Alzheimer's disease Sister     Social History   Social History  . Marital status: Widowed    Spouse name: N/A  . Number of children: N/A  . Years of education: N/A   Occupational History  . Not on file.   Social History Main Topics  . Smoking status: Never Smoker  . Smokeless tobacco: Never Used  . Alcohol use No  . Drug use: No  . Sexual activity: Not Currently   Other Topics Concern  . Not on file   Social History Narrative  . No narrative on file    Past Surgical History:  Procedure Laterality Date  . BREAST CYST ASPIRATION Left ~ 1948   "benign" (08/17/2012)  . carotid testing negative    . CHEST TUBE INSERTION Left 08/17/2012   Procedure: INSERTION PLEURAL DRAINAGE CATHETER;  Surgeon: Melrose Nakayama, MD;  Location: [facility];  Service: Thoracic;  Laterality: Left;  . COLONOSCOPY  9/02   polyp, hematochezia adenoma due in 5 yrs.  . CORONARY ANGIOPLASTY    . CORONARY ARTERY BYPASS GRAFT N/A 06/08/2012   Procedure: CORONARY ARTERY BYPASS GRAFTING (CABG);  Surgeon: Melrose Nakayama, MD;  Location: [facility];  Service:  Open Heart Surgery;  Laterality: N/A;  . DEXA OP  03/02  . INSERTION / PLACEMENT PLEURAL CATHETER Left 08/17/2012  . stress Echo negative  7/04  . TEE WITHOUT CARDIOVERSION N/A 06/08/2012   Procedure: TRANSESOPHAGEAL ECHOCARDIOGRAM (TEE);  Surgeon: Melrose Nakayama, MD;  Location: [facility];  Service: Open Heart Surgery;  Laterality: N/A;  . TSpine XRay Scoliosis  11/09/2004   mild thoracic     Prescriptions Prior to Admission  Medication Sig Dispense Refill Last Dose  . acetaminophen (TYLENOL) 500 MG tablet Take 500 mg by mouth 2 (two) times daily.   08/14/2016 at Unknown time  . ALPRAZolam (XANAX) 0.25 MG tablet Take 0.25  mg by mouth at bedtime.    08/14/2016 at Unknown time  . apixaban (ELIQUIS) 2.5 MG TABS tablet Take 1 tablet by mouth daily.   08/14/2016 at Unknown time  . cycloSPORINE (RESTASIS) 0.05 % ophthalmic emulsion Place 1 drop into both eyes 2 (two) times daily.     Marland Kitchen diltiazem (TIAZAC) 180 MG 24 hr capsule Take 1 capsule by mouth daily.   08/14/2016 at Unknown time  . ferrous sulfate 325 (65 FE) MG tablet Take 325 mg by mouth daily at 12 noon.   08/14/2016 at Unknown time  . furosemide (LASIX) 20 MG tablet Take 1 tablet by mouth 2 (two) times daily.   08/14/2016 at Unknown time  . hydroxypropyl methylcellulose (ISOPTO TEARS) 2.5 % ophthalmic solution Place 1 drop into both eyes 2 (two) times daily as needed (dry eyes).   prn at prn  . levothyroxine (SYNTHROID, LEVOTHROID) 50 MCG tablet Take 50 mcg by mouth daily before breakfast.    08/14/2016 at Unknown time  . magnesium hydroxide (MILK OF MAGNESIA) 400 MG/5ML suspension Take 10 mLs by mouth daily as needed for constipation.   prn at prn  . metoprolol succinate (TOPROL-XL) 25 MG 24 hr tablet Take 25 mg by mouth daily.     . polyethylene glycol powder (GLYCOLAX/MIRALAX) powder    prn at prn  . potassium chloride SA (K-DUR,KLOR-CON) 20 MEQ tablet Take 20 mEq by mouth 2 (two) times daily.    08/14/2016 at Unknown time  . pravastatin (PRAVACHOL) 40 MG tablet Take 40 mg by mouth at bedtime.    08/14/2016 at Unknown time  . vitamin B-12 (CYANOCOBALAMIN) 1000 MCG tablet Take 1,000 mcg by mouth daily.   08/14/2016 at Unknown time    Physical Exam: Blood pressure 132/60, pulse 87, temperature 97.9 F (36.6 C), resp. rate 14, height 5\' 2"  (1.575 m), weight 43.5 kg (96 lb), SpO2 95 %.   Wt Readings from Last 1 Encounters:  08/15/16 43.5 kg (96 lb)     General appearance: alert and cooperative Resp: clear to auscultation bilaterally Cardio: regular rate and rhythm and systolic murmur: late systolic 3/6, crescendo and decrescendo at 2nd left intercostal space GI: soft,  non-tender; bowel sounds normal; no masses,  no organomegaly Extremities: extremities normal, atraumatic, no cyanosis or edema Pulses: 2+ and symmetric Neurologic: Grossly normal  Labs:   Lab Results  Component Value Date   WBC 6.7 08/14/2016   HGB 10.8 (L) 08/14/2016   HCT 32.9 (L) 08/14/2016   MCV 90.8 08/14/2016   PLT 187 08/14/2016    Recent Labs Lab 08/14/16 1932  NA 135  K 4.2  CL 105  CO2 23  BUN 22*  CREATININE 1.81*  CALCIUM 8.4*  GLUCOSE 95   Lab Results  Component Value Date   CKTOTAL 37 08/14/2013  CKMB 0.9 08/14/2013   TROPONINI <0.03 08/15/2016      Radiology: cardiomegaly with cabg markers. Mild pulmonary vascular congestion EKG: nsr  ASSESSMENT AND PLAN:  83 yo with multiple medical problems with cad s/p cabg times 3 in 2014, history of severe as treated medically due to comorbid conditions, cardiomyopathy, paroxysmal afib and sss with ppm.   CHF-ef somewhat reduce form previous echo at 30-35%. Will need continued diuresis. Will change iv lasix to 40 iv bid for now and consider changing to po. Likely secondary to her cardiomyopathy and aortic valve disease.  Cad-s/p cabg, No evidence of active ischemia at present. Conitnue with toprolol at 25 bid, and pravastatin 40 mg dialy.  afib-paroxysmal . Currently tolerating eliquis at 2.5 bid with cardizm and metoprolol Will conitnue with this.  Aortic stenosis-conitnue with medical management. Pt is not interested in TAVR.   SSS-pacer is functioning normally.    Signed: Teodoro Spray MD, [facility] 08/15/2016, 2:45 PM

## 2016-08-15 NOTE — Plan of Care (Signed)
Problem: Activity: Goal: Capacity to carry out activities will improve Outcome: Progressing Patient ambulating independently in the room with no complaints on exertion. Will continue to encourage mobility through discharge.

## 2016-08-15 NOTE — Progress Notes (Signed)
*  PRELIMINARY RESULTS* Echocardiogram 2D Echocardiogram has been performed.  [place] 08/15/2016, 9:17 AM

## 2016-08-15 NOTE — ED Provider Notes (Signed)
[facility] Emergency Department Provider Note   ____________________________________________   First MD Initiated Contact with Patient 08/15/16 0022     (approximate)  I have reviewed the triage vital signs and the nursing notes.   HISTORY  Chief Complaint Shortness of Breath and Weakness    HPI [name] is a 83 y.o. female who comes into the hospital today with some shortness of breath. She's been having this on and off for the past 2 days. She's been unable to do anything because of shortness of breath makes her feel very weak. The patient denies any cough or swelling in her legs. She has been treated for fluid in the lungs in the past. She does take Lasix twice a day and has not missed any of her doses. The patient denies any chest pain and denies any nausea or vomiting. She doesn't weigh herself daily so she is unsure if she is fluid positive for fluid negative. The patient just reports that she was short of breath so she came into the hospital for evaluation.   Past Medical History:  Diagnosis Date  . Allergic rhinitis   . Anemia   . Anginal pain ([place])   . Coronary artery disease   . GERD (gastroesophageal reflux disease)   . Heart murmur   . Hyperlipidemia   . Hypertension   . Osteoarthritis of hand   . Osteopenia   . Recurrent pleural effusion on left   . Shortness of breath    "since the heart OR in 05/2012 cause of fluid" (08/17/2012)    Patient Active Problem List   Diagnosis Date Noted  . S/P CABG x 3 06/13/2012  . Coronary artery disease 06/13/2012  . CONSTIPATION, MILD 05/07/2009  . BACK PAIN, LUMBAR 05/07/2009  . LEG PAIN, BILATERAL 05/07/2009  . VERTIGO 05/16/2008  . COLONIC POLYPS, ADENOMATOUS 09/13/2007  . GERD 04/20/2007  . ANXIETY DISORDER, GENERALIZED 08/29/2006  . SYSTEMIC LUPUS ERYTHEMATOSUS 08/29/2006  . HYPERLIPIDEMIA 08/25/2006  . ANEMIA-NOS 08/25/2006  . HYPERTENSION 08/25/2006  . CAROTID ARTERY DISEASE  08/25/2006  . ALLERGIC RHINITIS 08/25/2006  . [place] SYNDROME 08/25/2006  . OSTEOPENIA 08/23/2006    Past Surgical History:  Procedure Laterality Date  . BREAST CYST ASPIRATION Left ~ 1948   "benign" (08/17/2012)  . carotid testing negative    . CHEST TUBE INSERTION Left 08/17/2012   Procedure: INSERTION PLEURAL DRAINAGE CATHETER;  Surgeon: Melrose Nakayama, MD;  Location: [place];  Service: Thoracic;  Laterality: Left;  . COLONOSCOPY  9/02   polyp, hematochezia adenoma due in 5 yrs.  . CORONARY ANGIOPLASTY    . CORONARY ARTERY BYPASS GRAFT N/A 06/08/2012   Procedure: CORONARY ARTERY BYPASS GRAFTING (CABG);  Surgeon: Melrose Nakayama, MD;  Location: [place];  Service: Open Heart Surgery;  Laterality: N/A;  . DEXA OP  03/02  . INSERTION / PLACEMENT PLEURAL CATHETER Left 08/17/2012  . stress Echo negative  7/04  . TEE WITHOUT CARDIOVERSION N/A 06/08/2012   Procedure: TRANSESOPHAGEAL ECHOCARDIOGRAM (TEE);  Surgeon: Melrose Nakayama, MD;  Location: [place];  Service: Open Heart Surgery;  Laterality: N/A;  . TSpine XRay Scoliosis  11/09/2004   mild thoracic    Prior to Admission medications   Medication Sig Start Date End Date Taking? Authorizing Provider  acetaminophen (TYLENOL) 500 MG tablet Take 500 mg by mouth 2 (two) times daily. 06/13/12  Yes Barrett, Erin R, PA-C  ALPRAZolam (XANAX) 0.25 MG tablet Take 0.25 mg by mouth at  bedtime.    Yes [provider]  apixaban (ELIQUIS) 2.5 MG TABS tablet Take 1 tablet by mouth daily.   Yes [provider]  cycloSPORINE (RESTASIS) 0.05 % ophthalmic emulsion Place 1 drop into both eyes 2 (two) times daily.   Yes [provider]  diltiazem (TIAZAC) 180 MG 24 hr capsule Take 1 capsule by mouth daily. 05/05/16  Yes [provider]  ferrous sulfate 325 (65 FE) MG tablet Take 325 mg by mouth daily at 12 noon.   Yes [provider]  furosemide (LASIX) 20 MG tablet Take 1 tablet by mouth 2 (two) times daily.  06/29/16  Yes [provider]  hydroxypropyl methylcellulose (ISOPTO TEARS) 2.5 % ophthalmic solution Place 1 drop into both eyes 2 (two) times daily as needed (dry eyes).   Yes [provider]  levothyroxine (SYNTHROID, LEVOTHROID) 50 MCG tablet Take 50 mcg by mouth daily before breakfast.  07/24/12  Yes [provider]  magnesium hydroxide (MILK OF MAGNESIA) 400 MG/5ML suspension Take 10 mLs by mouth daily as needed for constipation.   Yes [provider]  metoprolol succinate (TOPROL-XL) 25 MG 24 hr tablet Take 25 mg by mouth daily.   Yes [provider]  polyethylene glycol powder (GLYCOLAX/MIRALAX) powder  09/15/12  Yes [provider]  potassium chloride SA (K-DUR,KLOR-CON) 20 MEQ tablet Take 20 mEq by mouth 2 (two) times daily.  09/18/12  Yes [provider]  pravastatin (PRAVACHOL) 40 MG tablet Take 40 mg by mouth at bedtime.    Yes [provider]  vitamin B-12 (CYANOCOBALAMIN) 1000 MCG tablet Take 1,000 mcg by mouth daily.   Yes [provider]    Allergies Alendronate sodium and Simvastatin  Family History  Problem Relation Age of Onset  . Heart disease Mother   . Hypertension Mother   . Cancer Father 53    liver  . Alzheimer's disease Sister   . Hypertension Brother   . Sudden death Brother   . Hypertension Brother 32  . Heart disease Brother   . Hypertension Brother   . Alzheimer's disease Sister     Social History Social History  Substance Use Topics  . Smoking status: Never Smoker  . Smokeless tobacco: Never Used  . Alcohol use No    Review of Systems  Constitutional: No fever/chills Eyes: No visual changes. ENT: No sore throat. Cardiovascular: Denies chest pain. Respiratory:  shortness of breath. Gastrointestinal: No abdominal pain.  No nausea, no vomiting.  No diarrhea.  No constipation. Genitourinary: Negative for dysuria. Musculoskeletal: Negative for back pain. Skin: Negative  for rash. Neurological: Negative for headaches, focal weakness or numbness.   ____________________________________________   PHYSICAL EXAM:  VITAL SIGNS: ED Triage Vitals  Enc Vitals Group     BP 08/14/16 1926 (!) 140/48     Pulse Rate 08/14/16 1926 85     Resp 08/14/16 1926 20     Temp 08/14/16 1926 97.8 F (36.6 C)     Temp Source 08/14/16 1926 Oral     SpO2 08/14/16 1926 97 %     Weight --      Height --      Head Circumference --      Peak Flow --      Pain Score 08/14/16 2302 0     Pain Loc --      Pain Edu? --      Excl. in [place]? --     Constitutional: Alert and oriented. Well  appearing and in Mild distress. Eyes: Conjunctivae are normal. PERRL. EOMI. Head: Atraumatic. Nose: No congestion/rhinnorhea. Mouth/Throat: Mucous membranes are moist.  Oropharynx non-erythematous. Cardiovascular: Normal rate, regular rhythm. Loud systolic murmur  Good peripheral circulation. Respiratory: Normal respiratory effort.  No retractions. Crackles in bilateral bases. Gastrointestinal: Soft and nontender. No distention. Positive bowel sounds Musculoskeletal: No lower extremity tenderness nor edema.   Neurologic:  Normal speech and language.  Skin:  Skin is warm, dry and intact. Marland Kitchen Psychiatric: Mood and affect are normal.  ____________________________________________   LABS (all labs ordered are listed, but only abnormal results are displayed)  Labs Reviewed  BASIC METABOLIC PANEL - Abnormal; Notable for the following:       Result Value   BUN 22 (*)    Creatinine, Ser 1.81 (*)    Calcium 8.4 (*)    GFR calc non Af Amer 23 (*)    GFR calc Af Amer 27 (*)    All other components within normal limits  CBC - Abnormal; Notable for the following:    RBC 3.63 (*)    Hemoglobin 10.8 (*)    HCT 32.9 (*)    All other components within normal limits  URINALYSIS, COMPLETE (UACMP) WITH MICROSCOPIC - Abnormal; Notable for the following:    Color, Urine STRAW (*)    APPearance CLEAR  (*)    All other components within normal limits  BRAIN NATRIURETIC PEPTIDE - Abnormal; Notable for the following:    B Natriuretic Peptide 2,701.0 (*)    All other components within normal limits  TROPONIN I  TROPONIN I  CBG MONITORING, ED   ____________________________________________  EKG  ED ECG REPORT I, Loney Hering, the attending physician, personally viewed and interpreted this ECG.   Date: 08/14/2016  EKG Time: 1932  Rate: 85  Rhythm: normal sinus rhythm  Axis: left axis deviation  Intervals:none  ST&T Change: flipped t waves in V5 and V6  ____________________________________________  RADIOLOGY  CXR ____________________________________________   PROCEDURES  Procedure(s) performed: None  Procedures  Critical Care performed: No  ____________________________________________   INITIAL IMPRESSION / ASSESSMENT AND PLAN / ED COURSE  Pertinent labs & imaging results that were available during my care of the patient were reviewed by me and considered in my medical decision making (see chart for details).  This is a 83 year old who comes into the hospital today with some shortness of breath for the last few days. The patient has a history of pulmonary edema. Her chest x-ray shows cardiomegaly with some mild interstitial thickening with a concern for pulmonary venous congestion. She also has a trace left pleural effusion. I did give the patient a dose of Lasix. She has not been hypoxic in the emergency department. I attempted to have the patient walk to see if she could get around at home but she became severely tachypnea at 40 breaths per minute and tachycardic. Given the patient's fluid status and continued symptoms I will admit the patient to the hospitalist service.  Clinical Course as of Aug 15 499  Sun Aug 15, 2016  0050 Cardiomegaly with similar mild interstitial thickening. This is suspicious for mild pulmonary venous congestion.  trace left  pleural fluid or thickening.  Aortic atherosclerosis.   DG Chest 2 View [AW]    Clinical Course User Index [AW] Loney Hering, MD     ____________________________________________   FINAL CLINICAL IMPRESSION(S) / ED DIAGNOSES  Final diagnoses:  Acute pulmonary edema (HCC)  Shortness of breath  Congestive  heart failure, unspecified HF chronicity, unspecified heart failure type ([place])      NEW MEDICATIONS STARTED DURING THIS VISIT:  New Prescriptions   No medications on file     Note:  This document was prepared using Dragon voice recognition software and may include unintentional dictation errors.    Loney Hering, MD 08/15/16 [phone]

## 2016-08-16 LAB — BASIC METABOLIC PANEL
ANION GAP: 10 (ref 5–15)
BUN: 23 mg/dL — ABNORMAL HIGH (ref 6–20)
CALCIUM: 8.6 mg/dL — AB (ref 8.9–10.3)
CO2: 23 mmol/L (ref 22–32)
CREATININE: 1.88 mg/dL — AB (ref 0.44–1.00)
Chloride: 103 mmol/L (ref 101–111)
GFR, EST AFRICAN AMERICAN: 26 mL/min — AB (ref >60)
GFR, EST NON AFRICAN AMERICAN: 22 mL/min — AB (ref >60)
Glucose, Bld: 88 mg/dL (ref 65–99)
Potassium: 3.6 mmol/L (ref 3.5–5.1)
SODIUM: 136 mmol/L (ref 135–145)

## 2016-08-16 LAB — CBC
HCT: 35.8 % (ref 35.0–47.0)
Hemoglobin: 11.8 g/dL — ABNORMAL LOW (ref 12.0–16.0)
MCH: 29.3 pg (ref 26.0–34.0)
MCHC: 33 g/dL (ref 32.0–36.0)
MCV: 88.8 fL (ref 80.0–100.0)
PLATELETS: 211 10*3/uL (ref 150–440)
RBC: 4.03 MIL/uL (ref 3.80–5.20)
RDW: 14.4 % (ref 11.5–14.5)
WBC: 6.3 10*3/uL (ref 3.6–11.0)

## 2016-08-16 MED ORDER — FUROSEMIDE 20 MG PO TABS: 20.0000 mg | ORAL_TABLET | Freq: Every day | ORAL | 0 refills | Status: DC

## 2016-08-16 MED ORDER — FUROSEMIDE 20 MG PO TABS
20.0000 mg | ORAL_TABLET | Freq: Every day | ORAL | Status: DC
  Administered 2016-08-16: 20 mg via ORAL
  Filled 2016-08-16: qty 1

## 2016-08-16 MED ORDER — POTASSIUM CHLORIDE CRYS ER 10 MEQ PO TBCR: 10.0000 meq | EXTENDED_RELEASE_TABLET | Freq: Every day | ORAL | 0 refills | Status: DC

## 2016-08-16 NOTE — Consult Note (Addendum)
   [facility] CM Inpatient Consult   08/16/2016  [name] [facility] 11/08/1923 [id number]    [facility] Care Management referral received. Noted discharge order. Called into patient's room to discuss and offer and explain [facility] Care Management program services.   Patient declined [place] Management program services. States "my family provides for me and they take care of everything I need. They have someone who stays with me at night as well. I just do not think I need any more help".   Left voicemail for inpatient RNCM to make aware patient declined [place] Management services.   Marthenia Rolling, MSN-Ed, RN,BSN [place] Liaison [phone]

## 2016-08-16 NOTE — Care Management Important Message (Signed)
Important Message  Patient Details  Name: KELLENE MCCLEARY MRN: [id number] Date of Birth: [birth year]   Medicare Important Message Given:  N/A - LOS <3 / Initial given by admissions    Katrina Stack, RN 08/16/2016, 4:43 PM

## 2016-08-16 NOTE — Discharge Instructions (Signed)
Follow-up with primary care physician in a week. PCP to consider repeating BMP in a week during the follow-up visit Follow-up with the cardiology Dr. Neldon Newport shows in 2 weeks Daily weight monitoring Outpatient follow-up with CHF clinic

## 2016-08-16 NOTE — Discharge Summary (Signed)
[facility] [place] NAME: [name]    MR#:  [id number]  DATE OF BIRTH:  17-Sep-1923  DATE OF ADMISSION:  08/14/2016 ADMITTING PHYSICIAN: Harrie Foreman, MD  DATE OF DISCHARGE: 08/16/16 PRIMARY CARE PHYSICIAN: Rusty Aus, MD    ADMISSION DIAGNOSIS:  Shortness of breath [R06.02] Acute pulmonary edema (HCC) [J81.0] Congestive heart failure, unspecified HF chronicity, unspecified heart failure type ([place]) [I50.9]  DISCHARGE DIAGNOSIS:  Active Problems:   Acute on chronic systolic heart failure ([place])   SECONDARY DIAGNOSIS:   Past Medical History:  Diagnosis Date  . Allergic rhinitis   . Anemia   . Anginal pain ([place])   . Coronary artery disease   . GERD (gastroesophageal reflux disease)   . Heart murmur   . Hyperlipidemia   . Hypertension   . Osteoarthritis of hand   . Osteopenia   . Recurrent pleural effusion on left   . Shortness of breath    "since the heart OR in 05/2012 cause of fluid" (08/17/2012)    HOSPITAL COURSE:   Chief Complaint: Shortness of breath HPI: The patient with past medical history of congestive heart failure and coronary artery disease status post CABG and valve replacement presents to the emergency department complaining of shortness of breath. The patient states that she has intermittent episodes of shortness of breath the last of which was yesterday. It began in the morning. She called her primary care doctor who recommended taking Lasix as well as a potassium tablet. Her dyspnea improved for a brief period of time but returned in the evening. She denies orthopnea. In the emergency department BNP was dramatically elevated and pulmonary edema was seen on chest x-ray. The patient received Lasix IV and had good urine output. She felt better but upon ambulation respiratory rate increased to the 40s and her oximetry decreased to the 80s. By the time of my interview the patient was on room air but still felt  very weak. She denies chest pain. Due to ongoing dyspnea emergency department staff called the hospitalist service for admission.   1. CHF: Acute on chronic; systolic secondary to cardiomyopathy and severe aortic stenosis Clinically improved with medical management. Patient was given IV Lasix 40 mg twice a day, seen by [facility] cardiology. Lasix switched to by mouth. Plan is to discharge patient with Lasix 20 mg once daily and potassium 10 mEq by mouth once daily. Continue daily weight monitoring. Outpatient follow-up with Dr. Saralyn Pilar in 1-2 weeks. Echocardiogram with ejection fraction 30-35%. Has severe aortic stenosis  2. Acute kidney injury: Patient is on Lasix IV changed to by mouth in a.m. Check a.m. labs Avoid nephrotoxic agents. Improve cardiac output. Gentle diuresis. Creatinine 1.53-1.81(close to her baseline). PCP to consider repeating BMP in a week during her follow-up visit 3. Coronary artery disease: Stable; continue aspirin,statin 4. Hypertension: Continue metoprolol and Lasix and titrate as needed  5. Atrial fibrillation: Rate controlled; continue diltiazem and Eliquis 6. Hypothyroidism: Normal TSH; continue Synthroid 7. Hyperlipidemia: Continue statin therapy 8. GI prophylaxis: None 9. DVT prophylaxis: eliquis    DISCHARGE CONDITIONS:   fair  CONSULTS OBTAINED:  Treatment Team:  Teodoro Spray, MD   PROCEDURES  None   DRUG ALLERGIES:   Allergies  Allergen Reactions  . Alendronate Sodium Other (See Comments)    REACTION: GI upset  . Simvastatin Other (See Comments)    Feet feel heavy    DISCHARGE MEDICATIONS:   Current Discharge Medication List  CONTINUE these medications which have CHANGED   Details  furosemide (LASIX) 20 MG tablet Take 1 tablet (20 mg total) by mouth daily. Qty: 30 tablet, Refills: 0    potassium chloride SA (K-DUR,KLOR-CON) 10 MEQ tablet Take 1 tablet (10 mEq total) by mouth daily. Qty: 30 tablet, Refills: 0      CONTINUE these  medications which have NOT CHANGED   Details  acetaminophen (TYLENOL) 500 MG tablet Take 500 mg by mouth 2 (two) times daily.    ALPRAZolam (XANAX) 0.25 MG tablet Take 0.25 mg by mouth at bedtime.     apixaban (ELIQUIS) 2.5 MG TABS tablet Take 1 tablet by mouth daily.    cycloSPORINE (RESTASIS) 0.05 % ophthalmic emulsion Place 1 drop into both eyes 2 (two) times daily.    diltiazem (TIAZAC) 180 MG 24 hr capsule Take 1 capsule by mouth daily.    ferrous sulfate 325 (65 FE) MG tablet Take 325 mg by mouth daily at 12 noon.    hydroxypropyl methylcellulose (ISOPTO TEARS) 2.5 % ophthalmic solution Place 1 drop into both eyes 2 (two) times daily as needed (dry eyes).    levothyroxine (SYNTHROID, LEVOTHROID) 50 MCG tablet Take 50 mcg by mouth daily before breakfast.     magnesium hydroxide (MILK OF MAGNESIA) 400 MG/5ML suspension Take 10 mLs by mouth daily as needed for constipation.    metoprolol succinate (TOPROL-XL) 25 MG 24 hr tablet Take 25 mg by mouth daily.    polyethylene glycol powder (GLYCOLAX/MIRALAX) powder     pravastatin (PRAVACHOL) 40 MG tablet Take 40 mg by mouth at bedtime.     vitamin B-12 (CYANOCOBALAMIN) 1000 MCG tablet Take 1,000 mcg by mouth daily.         DISCHARGE INSTRUCTIONS:   Follow-up with primary care physician in a week. PCP to consider repeating BMP in a week during the follow-up visit Follow-up with the cardiology Dr. Neldon Newport shows in 2 weeks Daily weight monitoring Outpatient follow-up with CHF clinic  DIET:  Cardiac diet  DISCHARGE CONDITION:  Fair  ACTIVITY:  Activity as tolerated  OXYGEN:  Home Oxygen: No.   Oxygen Delivery: room air  DISCHARGE LOCATION:  home   If you experience worsening of your admission symptoms, develop shortness of breath, life threatening emergency, suicidal or homicidal thoughts you must seek medical attention immediately by calling 911 or calling your MD immediately  if symptoms less severe.  You Must read  complete instructions/literature along with all the possible adverse reactions/side effects for all the Medicines you take and that have been prescribed to you. Take any new Medicines after you have completely understood and accpet all the possible adverse reactions/side effects.   Please note  You were cared for by a hospitalist during your hospital stay. If you have any questions about your discharge medications or the care you received while you were in the hospital after you are discharged, you can call the unit and asked to speak with the hospitalist on call if the hospitalist that took care of you is not available. Once you are discharged, your primary care physician will handle any further medical issues. Please note that NO REFILLS for any discharge medications will be authorized once you are discharged, as it is imperative that you return to your primary care physician (or establish a relationship with a primary care physician if you do not have one) for your aftercare needs so that they can reassess your need for medications and monitor your lab values.  Today  Chief Complaint  Patient presents with  . Shortness of Breath  . Weakness   Patient is doing fine. Denies any shortness of breath. Wants to go home okay to discharge patient from cardiology standpoint  ROS:  CONSTITUTIONAL: Denies fevers, chills. Denies any fatigue, weakness.  EYES: Denies blurry vision, double vision, eye pain. EARS, NOSE, THROAT: Denies tinnitus, ear pain, hearing loss. RESPIRATORY: Denies cough, wheeze, shortness of breath.  CARDIOVASCULAR: Denies chest pain, palpitations, edema.  GASTROINTESTINAL: Denies nausea, vomiting, diarrhea, abdominal pain. Denies bright red blood per rectum. GENITOURINARY: Denies dysuria, hematuria. ENDOCRINE: Denies nocturia or thyroid problems. HEMATOLOGIC AND LYMPHATIC: Denies easy bruising or bleeding. SKIN: Denies rash or lesion. MUSCULOSKELETAL: Denies pain in neck,  back, shoulder, knees, hips or arthritic symptoms.  NEUROLOGIC: Denies paralysis, paresthesias.  PSYCHIATRIC: Denies anxiety or depressive symptoms.   VITAL SIGNS:  Blood pressure 118/60, pulse 89, temperature 97.4 F (36.3 C), temperature source Oral, resp. rate 19, height 5\' 2"  (1.575 m), weight 42.5 kg (93 lb 12.8 oz), SpO2 95 %.  I/O:    Intake/Output Summary (Last 24 hours) at 08/16/16 0919 Last data filed at 08/16/16 0345  Gross per 24 hour  Intake              240 ml  Output             1610 ml  Net            -1370 ml    PHYSICAL EXAMINATION:  GENERAL:  83 y.o.-year-old patient lying in the bed with no acute distress.  EYES: Pupils equal, round, reactive to light and accommodation. No scleral icterus. Extraocular muscles intact.  HEENT: Head atraumatic, normocephalic. Oropharynx and nasopharynx clear.  NECK:  Supple, no jugular venous distention. No thyroid enlargement, no tenderness.  LUNGS: Normal breath sounds bilaterally, no wheezing, rales,rhonchi or crepitation. No use of accessory muscles of respiration.  CARDIOVASCULAR: S1, S2 normal.Positive ejection systolic murmurs, no rubs, or gallops.  ABDOMEN: Soft, non-tender, non-distended. Bowel sounds present. No organomegaly or mass.  EXTREMITIES: No pedal edema, cyanosis, or clubbing.  NEUROLOGIC: Cranial nerves II through XII are intact. Muscle strength 5/5 in all extremities. Sensation intact. Gait not checked.  PSYCHIATRIC: The patient is alert and oriented x 3.  SKIN: No obvious rash, lesion, or ulcer.   DATA REVIEW:   CBC  Recent Labs Lab 08/16/16 0513  WBC 6.3  HGB 11.8*  HCT 35.8  PLT 211    Chemistries   Recent Labs Lab 08/16/16 0513  NA 136  K 3.6  CL 103  CO2 23  GLUCOSE 88  BUN 23*  CREATININE 1.88*  CALCIUM 8.6*    Cardiac Enzymes  Recent Labs Lab 08/15/16 0125  TROPONINI <0.03    Microbiology Results  Results for orders placed or performed during the hospital encounter of  06/15/15  Urine culture     Status: None   Collection Time: 06/15/15  3:28 AM  Result Value Ref Range Status   Specimen Description URINE, RANDOM  Final   Special Requests NONE  Final   Culture INSIGNIFICANT GROWTH  Final   Report Status 06/16/2015 FINAL  Final    RADIOLOGY:  Dg Chest 2 View  Result Date: 08/14/2016 CLINICAL DATA:  Patient reports feeling short of breath. States "I haven't felt good since yesterday." EXAM: CHEST  2 VIEW COMPARISON:  07/22/2016 FINDINGS: Prior median sternotomy. Osteopenia. Pacer with leads at right atrium and right ventricle. No lead discontinuity. Patient rotated left. Midline  trachea. Moderate cardiomegaly. Atherosclerosis in the transverse aorta. Trace left-sided pleural fluid or thickening. No pneumothorax. Similar mild interstitial thickening. No lobar consolidation. Medial left base scarring. IMPRESSION: Cardiomegaly with similar mild interstitial thickening. This is suspicious for mild pulmonary venous congestion. trace left pleural fluid or thickening. Aortic atherosclerosis. Electronically Signed   By: Abigail Miyamoto M.D.   On: 08/14/2016 20:12    EKG:   Orders placed or performed during the hospital encounter of 08/14/16  . ED EKG  . ED EKG      Management plans discussed with the patient, family and they are in agreement.  CODE STATUS:     Code Status Orders        Start     Ordered   08/15/16 0736  Full code  Continuous     08/15/16 0735    Code Status History    Date Active Date Inactive Code Status Order ID Comments User Context   06/06/2012  3:31 PM 06/08/2012  1:29 PM Full Code [id number]  Nani Skillern, PA Inpatient    Advance Directive Documentation     Most Recent Value  Type of Advance Directive  Healthcare Power of Attorney, Living will  Pre-existing out of facility DNR order (yellow form or pink MOST form)  -  "MOST" Form in Place?  -      TOTAL TIME TAKING CARE OF THIS PATIENT: 45 minutes.   Note: This  dictation was prepared with Dragon dictation along with smaller phrase technology. Any transcriptional errors that result from this process are unintentional.   @M[facility] @  on 08/16/2016 at 9:19 AM  Between 7am to 6pm - Pager - [phone]  After 6pm go to [email or web address] - password [email or web address] [place] [facility]  Office  [phone]  CC: Primary care physician; Rusty Aus, MD

## 2016-08-16 NOTE — Care Management (Signed)
Late entry  CM met with patient this morning to inquire about referral to Heart Failure Clinic- which she decline.  has access to scales and instructed on daily weights.  She declines need for home health nurse to assist with heart failure follow up

## 2016-08-16 NOTE — Progress Notes (Signed)
Discharge paperwork reviewed with patient. Information regarding follow-up appointments, medications and education for all newly prescribed meds was provided, all questions answered. Peripheral IV removed, catheter intact. Heart monitor removed, returned to the nurse station. Transport requested via wheelchair to the lobby for discharge.    

## 2016-08-16 NOTE — Progress Notes (Signed)
       [facility]  SUBJECTIVE: feels good   Vitals:   08/15/16 2008 08/16/16 0344 08/16/16 0432 08/16/16 0734  BP: (!) 160/60  95/81 118/60  Pulse: 95  81 89  Resp:   16 19  Temp: 98.6 F (37 C)  98.1 F (36.7 C) 97.4 F (36.3 C)  TempSrc: Oral  Oral Oral  SpO2: 96%  96% 95%  Weight:  42.5 kg (93 lb 12.8 oz)    Height:        Intake/Output Summary (Last 24 hours) at 08/16/16 0852 Last data filed at 08/16/16 0345  Gross per 24 hour  Intake              480 ml  Output             1610 ml  Net            -1130 ml    LABS: Basic Metabolic Panel:  Recent Labs  08/14/16 1932 08/16/16 0513  NA 135 136  K 4.2 3.6  CL 105 103  CO2 23 23  GLUCOSE 95 88  BUN 22* 23*  CREATININE 1.81* 1.88*  CALCIUM 8.4* 8.6*   Liver Function Tests: No results for input(s): AST, ALT, ALKPHOS, BILITOT, PROT, ALBUMIN in the last 72 hours. No results for input(s): LIPASE, AMYLASE in the last 72 hours. CBC:  Recent Labs  08/14/16 1932 08/16/16 0513  WBC 6.7 6.3  HGB 10.8* 11.8*  HCT 32.9* 35.8  MCV 90.8 88.8  PLT 187 211   Cardiac Enzymes:  Recent Labs  08/14/16 1932 08/15/16 0125  TROPONINI <0.03 <0.03   BNP: Invalid input(s): POCBNP D-Dimer: No results for input(s): DDIMER in the last 72 hours. Hemoglobin A1C: No results for input(s): HGBA1C in the last 72 hours. Fasting Lipid Panel: No results for input(s): CHOL, HDL, LDLCALC, TRIG, CHOLHDL, LDLDIRECT in the last 72 hours. Thyroid Function Tests:  Recent Labs  08/15/16 0800  TSH 3.218   Anemia Panel: No results for input(s): VITAMINB12, FOLATE, FERRITIN, TIBC, IRON, RETICCTPCT in the last 72 hours.   Physical Exam: Blood pressure 118/60, pulse 89, temperature 97.4 F (36.3 C), temperature source Oral, resp. rate 19, height 5\' 2"  (1.575 m), weight 42.5 kg (93 lb 12.8 oz), SpO2 95 %.   Wt Readings from Last 1 Encounters:  08/16/16 42.5 kg (93 lb 12.8 oz)      General appearance: alert and cooperative Resp: clear to auscultation bilaterally Cardio: regular rate and rhythm and systolic murmur: late systolic 3/6, crescendo and decrescendo at 2nd left intercostal space, at lower left sternal border, radiates to carotids GI: soft, non-tender; bowel sounds normal; no masses,  no organomegaly Extremities: extremities normal, atraumatic, no cyanosis or edema Neurologic: Grossly normal  TELEMETRY: Reviewed telemetry pt in :nsr  ASSESSMENT AND PLAN:  Active Problems:   Acute on chronic systolic heart failure (HCC)-chf improved. Secondary to cardiomyopathy and aortic stenosis. Back to baseline. OK for discharge on lasix 20 mg daily and kdur 10 meq daily and continuing the remainder of her meds. Follow up with Dr. Saralyn Pilar in 1-2 weeks.     Teodoro Spray, MD, [facility] 08/16/2016 8:52 AM

## 2016-08-19 DIAGNOSIS — I350 Nonrheumatic aortic (valve) stenosis: Secondary | ICD-10-CM | POA: Diagnosis not present

## 2016-08-19 DIAGNOSIS — I480 Paroxysmal atrial fibrillation: Secondary | ICD-10-CM | POA: Diagnosis not present

## 2016-08-19 DIAGNOSIS — D500 Iron deficiency anemia secondary to blood loss (chronic): Secondary | ICD-10-CM | POA: Diagnosis not present

## 2016-08-19 DIAGNOSIS — I5033 Acute on chronic diastolic (congestive) heart failure: Secondary | ICD-10-CM | POA: Diagnosis not present

## 2016-08-19 DIAGNOSIS — Z79899 Other long term (current) drug therapy: Secondary | ICD-10-CM | POA: Diagnosis not present

## 2016-08-20 LAB — HEMOGLOBIN A1C
Hgb A1c MFr Bld: 5.7 % — ABNORMAL HIGH (ref 4.8–5.6)
Mean Plasma Glucose: 117 mg/dL

## 2016-08-30 DIAGNOSIS — I2510 Atherosclerotic heart disease of native coronary artery without angina pectoris: Secondary | ICD-10-CM | POA: Diagnosis not present

## 2016-08-30 DIAGNOSIS — I350 Nonrheumatic aortic (valve) stenosis: Secondary | ICD-10-CM | POA: Diagnosis not present

## 2016-08-30 DIAGNOSIS — Z951 Presence of aortocoronary bypass graft: Secondary | ICD-10-CM | POA: Diagnosis not present

## 2016-08-30 DIAGNOSIS — I480 Paroxysmal atrial fibrillation: Secondary | ICD-10-CM | POA: Diagnosis not present

## 2016-08-30 DIAGNOSIS — N184 Chronic kidney disease, stage 4 (severe): Secondary | ICD-10-CM | POA: Diagnosis not present

## 2016-08-30 DIAGNOSIS — Z950 Presence of cardiac pacemaker: Secondary | ICD-10-CM | POA: Diagnosis not present

## 2016-08-30 DIAGNOSIS — I5033 Acute on chronic diastolic (congestive) heart failure: Secondary | ICD-10-CM | POA: Diagnosis not present

## 2016-08-30 DIAGNOSIS — Z8679 Personal history of other diseases of the circulatory system: Secondary | ICD-10-CM | POA: Diagnosis not present

## 2016-09-09 DIAGNOSIS — R197 Diarrhea, unspecified: Secondary | ICD-10-CM | POA: Diagnosis not present

## 2016-09-09 DIAGNOSIS — I480 Paroxysmal atrial fibrillation: Secondary | ICD-10-CM | POA: Diagnosis not present

## 2016-09-09 DIAGNOSIS — I10 Essential (primary) hypertension: Secondary | ICD-10-CM | POA: Diagnosis not present

## 2016-09-15 DIAGNOSIS — R0602 Shortness of breath: Secondary | ICD-10-CM | POA: Diagnosis not present

## 2016-09-15 DIAGNOSIS — I5033 Acute on chronic diastolic (congestive) heart failure: Secondary | ICD-10-CM | POA: Diagnosis not present

## 2016-09-15 DIAGNOSIS — I350 Nonrheumatic aortic (valve) stenosis: Secondary | ICD-10-CM | POA: Diagnosis not present

## 2016-09-15 DIAGNOSIS — J810 Acute pulmonary edema: Secondary | ICD-10-CM | POA: Diagnosis not present

## 2016-09-15 DIAGNOSIS — J9611 Chronic respiratory failure with hypoxia: Secondary | ICD-10-CM | POA: Diagnosis not present

## 2016-09-15 DIAGNOSIS — I5040 Unspecified combined systolic (congestive) and diastolic (congestive) heart failure: Secondary | ICD-10-CM | POA: Diagnosis not present

## 2016-09-17 ENCOUNTER — Inpatient Hospital Stay
Admission: EM | Admit: 2016-09-17 | Discharge: 2016-09-20 | DRG: 291 | Disposition: A | Discharge status: Home Health | Payer: Medicare HMO | Attending: Internal Medicine | Admitting: Internal Medicine

## 2016-09-17 ENCOUNTER — Emergency Department: Payer: Medicare HMO

## 2016-09-17 ENCOUNTER — Encounter: Payer: Self-pay | Admitting: Emergency Medicine

## 2016-09-17 DIAGNOSIS — Z79899 Other long term (current) drug therapy: Secondary | ICD-10-CM | POA: Diagnosis not present

## 2016-09-17 DIAGNOSIS — Z681 Body mass index (BMI) 19 or less, adult: Secondary | ICD-10-CM | POA: Diagnosis not present

## 2016-09-17 DIAGNOSIS — J9811 Atelectasis: Secondary | ICD-10-CM | POA: Diagnosis not present

## 2016-09-17 DIAGNOSIS — K219 Gastro-esophageal reflux disease without esophagitis: Secondary | ICD-10-CM | POA: Diagnosis present

## 2016-09-17 DIAGNOSIS — N184 Chronic kidney disease, stage 4 (severe): Secondary | ICD-10-CM | POA: Diagnosis not present

## 2016-09-17 DIAGNOSIS — E46 Unspecified protein-calorie malnutrition: Secondary | ICD-10-CM | POA: Diagnosis not present

## 2016-09-17 DIAGNOSIS — M19049 Primary osteoarthritis, unspecified hand: Secondary | ICD-10-CM | POA: Diagnosis present

## 2016-09-17 DIAGNOSIS — I25810 Atherosclerosis of coronary artery bypass graft(s) without angina pectoris: Secondary | ICD-10-CM | POA: Diagnosis not present

## 2016-09-17 DIAGNOSIS — Z515 Encounter for palliative care: Secondary | ICD-10-CM

## 2016-09-17 DIAGNOSIS — Z9861 Coronary angioplasty status: Secondary | ICD-10-CM | POA: Diagnosis not present

## 2016-09-17 DIAGNOSIS — Z8249 Family history of ischemic heart disease and other diseases of the circulatory system: Secondary | ICD-10-CM

## 2016-09-17 DIAGNOSIS — E785 Hyperlipidemia, unspecified: Secondary | ICD-10-CM | POA: Diagnosis present

## 2016-09-17 DIAGNOSIS — M8580 Other specified disorders of bone density and structure, unspecified site: Secondary | ICD-10-CM | POA: Diagnosis present

## 2016-09-17 DIAGNOSIS — I420 Dilated cardiomyopathy: Secondary | ICD-10-CM | POA: Diagnosis present

## 2016-09-17 DIAGNOSIS — I480 Paroxysmal atrial fibrillation: Secondary | ICD-10-CM | POA: Diagnosis not present

## 2016-09-17 DIAGNOSIS — I10 Essential (primary) hypertension: Secondary | ICD-10-CM

## 2016-09-17 DIAGNOSIS — E876 Hypokalemia: Secondary | ICD-10-CM

## 2016-09-17 DIAGNOSIS — I5023 Acute on chronic systolic (congestive) heart failure: Secondary | ICD-10-CM | POA: Diagnosis not present

## 2016-09-17 DIAGNOSIS — M419 Scoliosis, unspecified: Secondary | ICD-10-CM | POA: Diagnosis present

## 2016-09-17 DIAGNOSIS — I110 Hypertensive heart disease with heart failure: Secondary | ICD-10-CM | POA: Diagnosis not present

## 2016-09-17 DIAGNOSIS — E871 Hypo-osmolality and hyponatremia: Secondary | ICD-10-CM | POA: Diagnosis not present

## 2016-09-17 DIAGNOSIS — Z9981 Dependence on supplemental oxygen: Secondary | ICD-10-CM

## 2016-09-17 DIAGNOSIS — I130 Hypertensive heart and chronic kidney disease with heart failure and stage 1 through stage 4 chronic kidney disease, or unspecified chronic kidney disease: Principal | ICD-10-CM | POA: Diagnosis present

## 2016-09-17 DIAGNOSIS — I2510 Atherosclerotic heart disease of native coronary artery without angina pectoris: Secondary | ICD-10-CM | POA: Diagnosis present

## 2016-09-17 DIAGNOSIS — E039 Hypothyroidism, unspecified: Secondary | ICD-10-CM | POA: Diagnosis not present

## 2016-09-17 DIAGNOSIS — N179 Acute kidney failure, unspecified: Secondary | ICD-10-CM | POA: Diagnosis not present

## 2016-09-17 DIAGNOSIS — R0602 Shortness of breath: Secondary | ICD-10-CM | POA: Diagnosis not present

## 2016-09-17 DIAGNOSIS — Z66 Do not resuscitate: Secondary | ICD-10-CM | POA: Diagnosis not present

## 2016-09-17 DIAGNOSIS — Z888 Allergy status to other drugs, medicaments and biological substances status: Secondary | ICD-10-CM

## 2016-09-17 DIAGNOSIS — I350 Nonrheumatic aortic (valve) stenosis: Secondary | ICD-10-CM | POA: Diagnosis not present

## 2016-09-17 DIAGNOSIS — R531 Weakness: Secondary | ICD-10-CM

## 2016-09-17 DIAGNOSIS — Z951 Presence of aortocoronary bypass graft: Secondary | ICD-10-CM | POA: Diagnosis not present

## 2016-09-17 DIAGNOSIS — J309 Allergic rhinitis, unspecified: Secondary | ICD-10-CM | POA: Diagnosis present

## 2016-09-17 DIAGNOSIS — I5021 Acute systolic (congestive) heart failure: Secondary | ICD-10-CM

## 2016-09-17 DIAGNOSIS — Z7901 Long term (current) use of anticoagulants: Secondary | ICD-10-CM

## 2016-09-17 DIAGNOSIS — I38 Endocarditis, valve unspecified: Secondary | ICD-10-CM | POA: Diagnosis present

## 2016-09-17 LAB — BASIC METABOLIC PANEL
Anion gap: 8 (ref 5–15)
BUN: 19 mg/dL (ref 6–20)
CALCIUM: 8.6 mg/dL — AB (ref 8.9–10.3)
CO2: 21 mmol/L — ABNORMAL LOW (ref 22–32)
Chloride: 105 mmol/L (ref 101–111)
Creatinine, Ser: 1.61 mg/dL — ABNORMAL HIGH (ref 0.44–1.00)
GFR calc Af Amer: 31 mL/min — ABNORMAL LOW (ref >60)
GFR, EST NON AFRICAN AMERICAN: 27 mL/min — AB (ref >60)
GLUCOSE: 118 mg/dL — AB (ref 65–99)
POTASSIUM: 3.7 mmol/L (ref 3.5–5.1)
SODIUM: 134 mmol/L — AB (ref 135–145)

## 2016-09-17 LAB — BRAIN NATRIURETIC PEPTIDE

## 2016-09-17 LAB — CBC
HEMATOCRIT: 34.9 % — AB (ref 35.0–47.0)
Hemoglobin: 11.3 g/dL — ABNORMAL LOW (ref 12.0–16.0)
MCH: 29.5 pg (ref 26.0–34.0)
MCHC: 32.4 g/dL (ref 32.0–36.0)
MCV: 91.3 fL (ref 80.0–100.0)
PLATELETS: 195 10*3/uL (ref 150–440)
RBC: 3.83 MIL/uL (ref 3.80–5.20)
RDW: 15.4 % — AB (ref 11.5–14.5)
WBC: 7.3 10*3/uL (ref 3.6–11.0)

## 2016-09-17 LAB — TROPONIN I

## 2016-09-17 MED ORDER — ACETAMINOPHEN 325 MG PO TABS
650.0000 mg | ORAL_TABLET | ORAL | Status: DC | PRN
  Administered 2016-09-18 – 2016-09-19 (×2): 650 mg via ORAL
  Filled 2016-09-17 (×2): qty 2

## 2016-09-17 MED ORDER — SODIUM CHLORIDE 0.9% FLUSH
3.0000 mL | INTRAVENOUS | Status: DC | PRN
Start: 2016-09-17 — End: 2016-09-20

## 2016-09-17 MED ORDER — ONDANSETRON HCL 4 MG/2ML IJ SOLN: 4.0000 mg | Freq: Four times a day (QID) | INTRAMUSCULAR | Status: DC | PRN

## 2016-09-17 MED ORDER — FUROSEMIDE 10 MG/ML IJ SOLN
40.0000 mg | Freq: Every day | INTRAMUSCULAR | Status: DC
  Administered 2016-09-18 – 2016-09-20 (×3): 40 mg via INTRAVENOUS
  Filled 2016-09-17 (×3): qty 4

## 2016-09-17 MED ORDER — SODIUM CHLORIDE 0.9% FLUSH
3.0000 mL | Freq: Two times a day (BID) | INTRAVENOUS | Status: DC
  Administered 2016-09-18 – 2016-09-20 (×6): 3 mL via INTRAVENOUS

## 2016-09-17 MED ORDER — SODIUM CHLORIDE 0.9 % IV SOLN: 250.0000 mL | INTRAVENOUS | Status: DC | PRN

## 2016-09-17 MED ORDER — FUROSEMIDE 10 MG/ML IJ SOLN
80.0000 mg | Freq: Once | INTRAMUSCULAR | Status: AC
  Administered 2016-09-17: 80 mg via INTRAVENOUS
  Filled 2016-09-17: qty 8

## 2016-09-17 MED ORDER — NITROGLYCERIN 2 % TD OINT
1.0000 [in_us] | TOPICAL_OINTMENT | Freq: Once | TRANSDERMAL | Status: AC
  Administered 2016-09-17: 1 [in_us] via TOPICAL
  Filled 2016-09-17: qty 1

## 2016-09-17 NOTE — H&P (Addendum)
History and Physical   [facility] - [place] @ [facility] Admission History and Physical [employer], D.O.    Patient Name: [name] MR#: [id number] Date of Birth: [birth year] Date of Admission: 09/17/2016  Referring MD/NP/PA: Dr. Jimmye Norman Primary Care Physician: Rusty Aus, MD Patient coming from: Home Outpatient Specialists: Dr. Saralyn Pilar   Chief Complaint:  Chief Complaint  Patient presents with  . Shortness of Breath    HPI: [name] is a 83 y.o. female with a known history of  CAD, systolic CHF EF 10-93%, severe AS, GERD, HTN, HLD, OA, osteopenia presents to the emergency department for evaluation of shortness of breath.  Patient was in a usual state of health until the past week or so when she reports gradually worsening shortness of breath, dyspnea on exertion despite compliance with diet and medication. She experiences episodes of severe dyspnea even with minimal exertion.  She just obtained home O2 which she has been using for the past week. Of note she was hospitalized one month ago for acute exacerbation of systolic heart failure.  Patient denies fevers/chills, weakness, dizziness, chest pain, shortness of breath, N/V/C/D, abdominal pain, dysuria/frequency, changes in mental status.    Otherwise there has been no change in status. Patient has been taking medication as prescribed and there has been no recent change in medication or diet.  No recent antibiotics.  There has been no recent illness, hospitalizations, travel or sick contacts.    EMS/ED Course: Patient received Lasix 80, NTG paste 1inch with improvement in symptoms.  Medical admission was requested for continued management of acute exacerbation of systolic congestive heart failure 2/2 severe AS.   Review of Systems:  CONSTITUTIONAL: No fever/chills, fatigue, weakness, weight gain/loss, headache. EYES: No blurry or double vision. ENT: No tinnitus, postnasal drip, redness or soreness of the  oropharynx. RESPIRATORY: Positive cough, dyspnea, wheeze.  No hemoptysis.  CARDIOVASCULAR: No chest pain, palpitations, syncope, orthopnea. No lower extremity edema.  GASTROINTESTINAL: No nausea, vomiting, abdominal pain, diarrhea, constipation.  No hematemesis, melena or hematochezia. GENITOURINARY: No dysuria, frequency, hematuria. ENDOCRINE: No polyuria or nocturia. No heat or cold intolerance. HEMATOLOGY: No anemia, bruising, bleeding. INTEGUMENTARY: No rashes, ulcers, lesions. MUSCULOSKELETAL: No arthritis, gout, dyspnea. NEUROLOGIC: No numbness, tingling, ataxia, seizure-type activity, weakness. PSYCHIATRIC: No anxiety, depression, insomnia.   Past Medical History:  Diagnosis Date  . Allergic rhinitis   . Anemia   . Anginal pain ([place])   . Coronary artery disease   . GERD (gastroesophageal reflux disease)   . Heart murmur   . Hyperlipidemia   . Hypertension   . Osteoarthritis of hand   . Osteopenia   . Recurrent pleural effusion on left   . Shortness of breath    "since the heart OR in 05/2012 cause of fluid" (08/17/2012)    Past Surgical History:  Procedure Laterality Date  . BREAST CYST ASPIRATION Left ~ 1948   "benign" (08/17/2012)  . carotid testing negative    . CHEST TUBE INSERTION Left 08/17/2012   Procedure: INSERTION PLEURAL DRAINAGE CATHETER;  Surgeon: Melrose Nakayama, MD;  Location: [place];  Service: Thoracic;  Laterality: Left;  . COLONOSCOPY  9/02   polyp, hematochezia adenoma due in 5 yrs.  . CORONARY ANGIOPLASTY    . CORONARY ARTERY BYPASS GRAFT N/A 06/08/2012   Procedure: CORONARY ARTERY BYPASS GRAFTING (CABG);  Surgeon: Melrose Nakayama, MD;  Location: [place];  Service: Open Heart Surgery;  Laterality: N/A;  . DEXA OP  03/02  . INSERTION /  PLACEMENT PLEURAL CATHETER Left 08/17/2012  . stress Echo negative  7/04  . TEE WITHOUT CARDIOVERSION N/A 06/08/2012   Procedure: TRANSESOPHAGEAL ECHOCARDIOGRAM (TEE);  Surgeon: Melrose Nakayama, MD;  Location: [place];  Service: Open Heart Surgery;  Laterality: N/A;  . TSpine XRay Scoliosis  11/09/2004   mild thoracic     reports that she has never smoked. She has never used smokeless tobacco. She reports that she does not drink alcohol or use drugs.  Allergies  Allergen Reactions  . Alendronate Sodium Other (See Comments)    REACTION: GI upset  . Simvastatin Other (See Comments)    Feet feel heavy    Family History  Problem Relation Age of Onset  . Heart disease Mother   . Hypertension Mother   . Cancer Father 36       liver  . Alzheimer's disease Sister   . Hypertension Brother   . Sudden death Brother   . Hypertension Brother 56  . Heart disease Brother   . Hypertension Brother   . Alzheimer's disease Sister     Prior to Admission medications   Medication Sig Start Date End Date Taking? Authorizing Provider  acetaminophen (TYLENOL) 500 MG tablet Take 500 mg by mouth 2 (two) times daily. 06/13/12   Barrett, Erin R, PA-C  ALPRAZolam (XANAX) 0.25 MG tablet Take 0.25 mg by mouth at bedtime.     [provider]  apixaban (ELIQUIS) 2.5 MG TABS tablet Take 1 tablet by mouth daily.    [provider]  cycloSPORINE (RESTASIS) 0.05 % ophthalmic emulsion Place 1 drop into both eyes 2 (two) times daily.    [provider]  diltiazem (TIAZAC) 180 MG 24 hr capsule Take 1 capsule by mouth daily. 05/05/16   [provider]  ferrous sulfate 325 (65 FE) MG tablet Take 325 mg by mouth daily at 12 noon.    [provider]  furosemide (LASIX) 20 MG tablet Take 1 tablet (20 mg total) by mouth daily. 08/16/16   Gouru, Illene Silver, MD  hydroxypropyl methylcellulose (ISOPTO TEARS) 2.5 % ophthalmic solution Place 1 drop into both eyes 2 (two) times daily as needed (dry eyes).    [provider]  levothyroxine (SYNTHROID, LEVOTHROID) 50 MCG tablet Take 50 mcg by mouth daily before breakfast.  07/24/12   [provider]  magnesium hydroxide (MILK OF MAGNESIA)  400 MG/5ML suspension Take 10 mLs by mouth daily as needed for constipation.    [provider]  metoprolol succinate (TOPROL-XL) 25 MG 24 hr tablet Take 25 mg by mouth daily.    [provider]  polyethylene glycol powder (GLYCOLAX/MIRALAX) powder  09/15/12   [provider]  potassium chloride SA (K-DUR,KLOR-CON) 10 MEQ tablet Take 1 tablet (10 mEq total) by mouth daily. 08/16/16   Nicholes Mango, MD  pravastatin (PRAVACHOL) 40 MG tablet Take 40 mg by mouth at bedtime.     [provider]  vitamin B-12 (CYANOCOBALAMIN) 1000 MCG tablet Take 1,000 mcg by mouth daily.    [provider]    Physical Exam: Vitals:   09/17/16 1931 09/17/16 2018 09/17/16 2019 09/17/16 2030  BP: (!) 154/76 (!) 191/86  (!) 180/71  Pulse: 92 82 79 77  Resp: (!) 24 (!) 22 (!) 29 (!) 29  Temp: 98.2 F (36.8 C)     TempSrc: Oral     SpO2: 92% 94% 95% 98%  Weight: 43.1 kg (95 lb)     Height: 5\' 3"  (1.6  m)       GENERAL: 83 y.o.-year-old female patient, thin, frail lying in the bed in no acute distress.  Pleasant and cooperative.   HEENT: Head atraumatic, normocephalic. Pupils equal, round, reactive to light and accommodation. No scleral icterus. Extraocular muscles intact. Nares are patent. Oropharynx is clear. Mucus membranes dry. NECK: Supple, full range of motion.  CHEST: Bibasilar rales, slight expiratory wheeze.  No use of accessory muscles of respiration.  No reproducible chest wall tenderness. Midline scar, well-healed CARDIOVASCULAR: S1, S2 normal. No murmurs, rubs, or gallops. Cap refill <2 seconds. Pulses intact distally.  ABDOMEN: Soft, nondistended, nontender. No rebound, guarding, rigidity. Normoactive bowel sounds present in all four quadrants. No organomegaly or mass. EXTREMITIES: No pedal edema, cyanosis, or clubbing. No calf tenderness or Homan's sign.  NEUROLOGIC: The patient is alert and oriented x 3. Cranial nerves II through XII are grossly intact with no  focal sensorimotor deficit. Muscle strength 5/5 in all extremities. Sensation intact. Gait not checked. PSYCHIATRIC:  Normal affect, mood, thought content. SKIN: Warm, dry, and intact without obvious rash, lesion, or ulcer.    Labs on Admission:  CBC:  Recent Labs Lab 09/17/16 1932  WBC 7.3  HGB 11.3*  HCT 34.9*  MCV 91.3  PLT 144   Basic Metabolic Panel:  Recent Labs Lab 09/17/16 1932  NA 134*  K 3.7  CL 105  CO2 21*  GLUCOSE 118*  BUN 19  CREATININE 1.61*  CALCIUM 8.6*   GFR: Estimated Creatinine Clearance: 15.5 mL/min (A) (by C-G formula based on SCr of 1.61 mg/dL (H)). Liver Function Tests: No results for input(s): AST, ALT, ALKPHOS, BILITOT, PROT, ALBUMIN in the last 168 hours. No results for input(s): LIPASE, AMYLASE in the last 168 hours. No results for input(s): AMMONIA in the last 168 hours. Coagulation Profile: No results for input(s): INR, PROTIME in the last 168 hours. Cardiac Enzymes:  Recent Labs Lab 09/17/16 1932  TROPONINI <0.03   BNP (last 3 results) No results for input(s): PROBNP in the last 8760 hours. HbA1C: No results for input(s): HGBA1C in the last 72 hours. CBG: No results for input(s): GLUCAP in the last 168 hours. Lipid Profile: No results for input(s): CHOL, HDL, LDLCALC, TRIG, CHOLHDL, LDLDIRECT in the last 72 hours. Thyroid Function Tests: No results for input(s): TSH, T4TOTAL, FREET4, T3FREE, THYROIDAB in the last 72 hours. Anemia Panel: No results for input(s): VITAMINB12, FOLATE, FERRITIN, TIBC, IRON, RETICCTPCT in the last 72 hours. Urine analysis:    Component Value Date/Time   COLORURINE STRAW (A) 08/15/2016 0125   APPEARANCEUR CLEAR (A) 08/15/2016 0125   APPEARANCEUR Clear 01/28/2013 1542   LABSPEC 1.005 08/15/2016 0125   LABSPEC 1.008 01/28/2013 1542   PHURINE 7.0 08/15/2016 0125   GLUCOSEU NEGATIVE 08/15/2016 0125   GLUCOSEU 50 mg/dL 01/28/2013 1542   HGBUR NEGATIVE 08/15/2016 0125   BILIRUBINUR NEGATIVE  08/15/2016 0125   BILIRUBINUR Negative 01/28/2013 1542   KETONESUR NEGATIVE 08/15/2016 0125   PROTEINUR NEGATIVE 08/15/2016 0125   UROBILINOGEN 0.2 06/06/2012 1619   NITRITE NEGATIVE 08/15/2016 0125   LEUKOCYTESUR NEGATIVE 08/15/2016 0125   LEUKOCYTESUR Negative 01/28/2013 1542   Sepsis Labs: @LABRCNTIP (procalcitonin:4,lacticidven:4) )No results found for this or any previous visit (from the past 240 hour(s)).   Radiological Exams on Admission: Dg Chest 2 View  Result Date: 09/17/2016 CLINICAL DATA:  Shortness of breath and weakness today. EXAM: CHEST  2 VIEW COMPARISON:  08/14/2016 and prior exams FINDINGS: Cardiomegaly noted. Mild pulmonary vascular congestion present. Right pleural  effusion and right basilar atelectasis noted. New airspace opacities/atelectasis within the lower left lung noted. There is no evidence of pneumothorax. IMPRESSION: New right pleural effusion and right basilar atelectasis and left lower lung airspace disease/ atelectasis. Cardiomegaly and mild pulmonary vascular congestion. Electronically Signed   By: Margarette Canada M.D.   On: 09/17/2016 20:06    EKG: Atrial paced at 85bpm with normal axis and nonspecific ST-T wave changes.   ECHO: 08/15/16 Study Conclusions  - Left ventricle: The cavity size was mildly dilated. Wall   thickness was increased in a pattern of mild LVH. Systolic   function was moderately to severely reduced. The estimated   ejection fraction was in the range of 30% to 35%. Diffuse   hypokinesis. Akinesis of the anteroseptal myocardium. - Aortic valve: There was severe stenosis. There was moderate   regurgitation. Valve area (VTI): 0.39 cm^2. Valve area (Vmax):   0.42 cm^2. Valve area (Vmean): 0.37 cm^2. - Mitral valve: There was moderate regurgitation. Valve area by   continuity equation (using LVOT flow): 1.31 cm^2. - Left atrium: The atrium was mildly dilated.   Assessment/Plan  This is a 83 y.o. female with a history of  cardiomyopathy, CAD s/p CABG, PAF, systolic CHF EF 96-22%, severe AS, GERD, HTN, HLD, OA, osteopenia now being admitted with:  #. Acute exacerbation of congestive heart failure - Admit inpatient, telemetry monitoring. - Lasix IV. Continue metoprolol, potassium. Not on an ACE - Intake/output, daily weight. - Trend troponins, check lipids and TSH. - Echo done 08/15/16 - Cardiology consultation requested - Dr. Saralyn Pilar  #. History of paroxysmal atrial fibrillation - Continue Eliquis, diltiazem, metoprolol  #. History of hypothyroidism - Continue levothyroxine  #. History of HLD - Continue pravastatin  #. Malnutrition - Dietitian consult  #. Social work consult for assisted living placement assistance.   Admission status: Inpatient, tele IV Fluids: HL Diet/Nutrition: Heart healthy Consults called: Cardio, SW DVT Px: Eliquis SCDs and early ambulation. Code Status: Full Code  Disposition Plan: To be determined in 2-3 days  All the records are reviewed and case discussed with ED provider. Management plans discussed with the patient and/or family who express understanding and agree with plan of care.  Jameire Kouba D.O. on 09/17/2016 at 9:01 PM Between 7am to 6pm - Pager - [phone] After 6pm go to [email or web address] - [employer] [facility] [place] [facility] Office [phone] CC: Primary care physician; Rusty Aus, MD   09/17/2016, 9:01 PM

## 2016-09-17 NOTE — ED Provider Notes (Addendum)
[facility] Emergency Department Provider Note       Time seen: ----------------------------------------- 7:48 PM on 09/17/2016 -----------------------------------------     I have reviewed the triage vital signs and the nursing notes.   HISTORY   Chief Complaint Shortness of Breath    HPI [name] is a 83 y.o. female who presents to the ED for shortness of breath. Patient reports she has a history of a valvular disorder which causes fluid retention. Patient reports over the last several days she has had a marked reduction in her ability to walk due to shortness of breath. Any activity makes her profoundly short of breath. She denies fevers or chills, has occasional chest pain. She has been taking 80 mg Lasix daily with potassium.   Past Medical History:  Diagnosis Date  . Allergic rhinitis   . Anemia   . Anginal pain ([place])   . Coronary artery disease   . GERD (gastroesophageal reflux disease)   . Heart murmur   . Hyperlipidemia   . Hypertension   . Osteoarthritis of hand   . Osteopenia   . Recurrent pleural effusion on left   . Shortness of breath    "since the heart OR in 05/2012 cause of fluid" (08/17/2012)    Patient Active Problem List   Diagnosis Date Noted  . Acute on chronic systolic heart failure ([place]) 08/15/2016  . S/P CABG x 3 06/13/2012  . Coronary artery disease 06/13/2012  . CONSTIPATION, MILD 05/07/2009  . BACK PAIN, LUMBAR 05/07/2009  . LEG PAIN, BILATERAL 05/07/2009  . VERTIGO 05/16/2008  . COLONIC POLYPS, ADENOMATOUS 09/13/2007  . GERD 04/20/2007  . ANXIETY DISORDER, GENERALIZED 08/29/2006  . SYSTEMIC LUPUS ERYTHEMATOSUS 08/29/2006  . HYPERLIPIDEMIA 08/25/2006  . ANEMIA-NOS 08/25/2006  . HYPERTENSION 08/25/2006  . CAROTID ARTERY DISEASE 08/25/2006  . ALLERGIC RHINITIS 08/25/2006  . [place] SYNDROME 08/25/2006  . OSTEOPENIA 08/23/2006    Past Surgical History:  Procedure Laterality Date  . BREAST CYST  ASPIRATION Left ~ 1948   "benign" (08/17/2012)  . carotid testing negative    . CHEST TUBE INSERTION Left 08/17/2012   Procedure: INSERTION PLEURAL DRAINAGE CATHETER;  Surgeon: Melrose Nakayama, MD;  Location: [place];  Service: Thoracic;  Laterality: Left;  . COLONOSCOPY  9/02   polyp, hematochezia adenoma due in 5 yrs.  . CORONARY ANGIOPLASTY    . CORONARY ARTERY BYPASS GRAFT N/A 06/08/2012   Procedure: CORONARY ARTERY BYPASS GRAFTING (CABG);  Surgeon: Melrose Nakayama, MD;  Location: [place];  Service: Open Heart Surgery;  Laterality: N/A;  . DEXA OP  03/02  . INSERTION / PLACEMENT PLEURAL CATHETER Left 08/17/2012  . stress Echo negative  7/04  . TEE WITHOUT CARDIOVERSION N/A 06/08/2012   Procedure: TRANSESOPHAGEAL ECHOCARDIOGRAM (TEE);  Surgeon: Melrose Nakayama, MD;  Location: [place];  Service: Open Heart Surgery;  Laterality: N/A;  . TSpine XRay Scoliosis  11/09/2004   mild thoracic    Allergies Alendronate sodium and Simvastatin  Social History Social History  Substance Use Topics  . Smoking status: Never Smoker  . Smokeless tobacco: Never Used  . Alcohol use No    Review of Systems Constitutional: Negative for fever. Cardiovascular: Negative for chest pain. Respiratory: Positive for shortness of breath Gastrointestinal: Negative for abdominal pain, vomiting and diarrhea. Genitourinary: Negative for dysuria. Musculoskeletal: Negative for back pain. Skin: Negative for rash. Neurological: Negative for headaches, focal weakness or numbness.  All systems negative/normal/unremarkable except as stated in the HPI  ____________________________________________   PHYSICAL EXAM:  VITAL SIGNS: ED Triage Vitals  Enc Vitals Group     BP 09/17/16 1931 (!) 154/76     Pulse Rate 09/17/16 1931 92     Resp 09/17/16 1931 (!) 24     Temp 09/17/16 1931 98.2 F (36.8 C)     Temp Source 09/17/16 1931 Oral     SpO2 09/17/16 1931 92 %     Weight 09/17/16 1931 95 lb (43.1 kg)      Height 09/17/16 1931 5\' 3"  (1.6 m)     Head Circumference --      Peak Flow --      Pain Score 09/17/16 1930 0     Pain Loc --      Pain Edu? --      Excl. in [place]? --     Constitutional: Alert and oriented. Mild to moderate distress Eyes: Conjunctivae are normal. Normal extraocular movements. ENT   Head: Normocephalic and atraumatic.   Nose: No congestion/rhinnorhea.   Mouth/Throat: Mucous membranes are moist.   Neck: No stridor. Cardiovascular: Normal rate, regular rhythm. No murmurs, rubs, or gallops. Respiratory: Tachypnea with rales bilaterally Gastrointestinal: Soft and nontender. Normal bowel sounds Musculoskeletal: Nontender with normal range of motion in extremities. No lower extremity tenderness nor edema. Neurologic:  Normal speech and language. No gross focal neurologic deficits are appreciated.  Skin:  Skin is warm, dry and intact. No rash noted. Psychiatric: Mood and affect are normal. Speech and behavior are normal.  ____________________________________________  EKG: Interpreted by me. Atrial paced rhythm with a rate of 85 bpm. Adequate pacemaker activity  ____________________________________________  ED COURSE:  Pertinent labs & imaging results that were available during my care of the patient were reviewed by me and considered in my medical decision making (see chart for details). Patient presents for dyspnea, we will assess with labs and imaging as indicated.   Procedures ____________________________________________   LABS (pertinent positives/negatives)  Labs Reviewed  BASIC METABOLIC PANEL - Abnormal; Notable for the following:       Result Value   Sodium 134 (*)    CO2 21 (*)    Glucose, Bld 118 (*)    Creatinine, Ser 1.61 (*)    Calcium 8.6 (*)    GFR calc non Af Amer 27 (*)    GFR calc Af Amer 31 (*)    All other components within normal limits  CBC - Abnormal; Notable for the following:    Hemoglobin 11.3 (*)    HCT 34.9 (*)     RDW 15.4 (*)    All other components within normal limits  BRAIN NATRIURETIC PEPTIDE - Abnormal; Notable for the following:    B Natriuretic Peptide >4,500.0 (*)    All other components within normal limits  TROPONIN I   CRITICAL CARE Performed by: Earleen Newport   Total critical care time: 30 minutes  Critical care time was exclusive of separately billable procedures and treating other patients.  Critical care was necessary to treat or prevent imminent or life-threatening deterioration.  Critical care was time spent personally by me on the following activities: development of treatment plan with patient and/or surrogate as well as nursing, discussions with consultants, evaluation of patient's response to treatment, examination of patient, obtaining history from patient or surrogate, ordering and performing treatments and interventions, ordering and review of laboratory studies, ordering and review of radiographic studies, pulse oximetry and re-evaluation of patient's condition.  RADIOLOGY Images were viewed by me  Chest x-ray IMPRESSION: New right pleural effusion and right basilar atelectasis and left lower lung airspace disease/ atelectasis.  Cardiomegaly and mild pulmonary vascular congestion.   ____________________________________________  FINAL ASSESSMENT AND PLAN  Congestive heart failure, aortic stenosis   Plan: Patient's labs and imaging were dictated above. Patient had presented for worsening shortness of breath and dyspnea on exertion to the point that she came to the ER for evaluation. I given her an extra dose of her 80 mg Lasix as well as nitroglycerin here in the ER. This is likely secondary to congestive heart failure and/or her aortic stenosis. She would benefit from hospitalization and cardiology consultation.   Earleen Newport, MD   Note: This note was generated in part or whole with voice recognition software. Voice recognition is usually  quite accurate but there are transcription errors that can and very often do occur. I apologize for any typographical errors that were not detected and corrected.     Earleen Newport, MD 09/17/16 2039    Earleen Newport, MD 09/17/16 2121

## 2016-09-17 NOTE — ED Notes (Signed)
Per family pt was given xanax at home prior to ED arrival approx 1830 , MD aware

## 2016-09-17 NOTE — ED Notes (Signed)
Pt diaper checked for urine, diaper is clean and dry. Pt has urine output in suction cannister noted. Will document output.

## 2016-09-17 NOTE — ED Notes (Signed)
Lab called for BNP add on at thsi time

## 2016-09-17 NOTE — ED Notes (Signed)
External female catheter applied to pt at thsi time and connected to suction by this RN and Ambulance person

## 2016-09-17 NOTE — ED Triage Notes (Signed)
Pt to stat desk via [place], report SOB, report hx of valve defect causing fluid back up, pt on 2L of oxygen chronically, 89% at desk, taken to rm 2.  Denies CP.  o2 increased to 3L

## 2016-09-18 LAB — BASIC METABOLIC PANEL
ANION GAP: 9 (ref 5–15)
BUN: 18 mg/dL (ref 6–20)
CALCIUM: 8.4 mg/dL — AB (ref 8.9–10.3)
CO2: 24 mmol/L (ref 22–32)
Chloride: 105 mmol/L (ref 101–111)
Creatinine, Ser: 1.62 mg/dL — ABNORMAL HIGH (ref 0.44–1.00)
GFR calc Af Amer: 31 mL/min — ABNORMAL LOW (ref >60)
GFR, EST NON AFRICAN AMERICAN: 27 mL/min — AB (ref >60)
GLUCOSE: 89 mg/dL (ref 65–99)
Potassium: 3.6 mmol/L (ref 3.5–5.1)
SODIUM: 138 mmol/L (ref 135–145)

## 2016-09-18 LAB — BRAIN NATRIURETIC PEPTIDE: B Natriuretic Peptide: >4500 pg/mL — ABNORMAL HIGH (ref 0.0–100.0)

## 2016-09-18 LAB — TROPONIN I: Troponin I: <0.03 ng/mL (ref <0.03)

## 2016-09-18 LAB — TSH: TSH: 1.878 u[IU]/mL (ref 0.350–4.500)

## 2016-09-18 LAB — MAGNESIUM: Magnesium: 2.1 mg/dL (ref 1.7–2.4)

## 2016-09-18 MED ORDER — ORAL CARE MOUTH RINSE
15.0000 mL | Freq: Two times a day (BID) | OROMUCOSAL | Status: DC
  Administered 2016-09-18 – 2016-09-19 (×3): 15 mL via OROMUCOSAL

## 2016-09-18 MED ORDER — ALPRAZOLAM 0.25 MG PO TABS
0.2500 mg | ORAL_TABLET | Freq: Every day | ORAL | Status: DC
  Administered 2016-09-18 – 2016-09-19 (×3): 0.25 mg via ORAL
  Filled 2016-09-18 (×3): qty 1

## 2016-09-18 MED ORDER — POTASSIUM CHLORIDE CRYS ER 10 MEQ PO TBCR
10.0000 meq | EXTENDED_RELEASE_TABLET | Freq: Every day | ORAL | Status: DC
  Administered 2016-09-18 – 2016-09-19 (×2): 10 meq via ORAL
  Filled 2016-09-18 (×2): qty 1

## 2016-09-18 NOTE — Clinical Social Work Note (Addendum)
CSW received consult for possible placement. CSW will follow pending PT recommendations. The CSW will speak with the family concerning possible ALF placement.  Santiago Bumpers, MSW, Latanya Presser [phone]

## 2016-09-18 NOTE — Progress Notes (Signed)
[place] [facility] [place] NAME: [name]    MR#:  [id number]  DATE OF BIRTH:  07-Apr-1924  SUBJECTIVE:  CHIEF COMPLAINT:   Chief Complaint  Patient presents with  . Shortness of Breath  The patient is 83 year old Caucasian female with medical history significant for history of coronary artery disease, systolic CHF, cardiomyopathy, ejection fraction of 30-35%, severe aortic stenosis, hypertension, hyperlipidemia, osteopenia, who presents to the hospital with complaints of shortness of breath for the past one week, dyspnea on exertion, orthopnea. She was just initiated on home oxygen therapy. On arrival to the hospital, she was noted to have congestive heart failure and was admitted. She feels better now since 1.2 L of diuresis.  Review of Systems  Constitutional: Negative for chills, fever and weight loss.  HENT: Negative for congestion.   Eyes: Negative for blurred vision and double vision.  Respiratory: Positive for cough. Negative for sputum production, shortness of breath and wheezing.   Cardiovascular: Positive for orthopnea, leg swelling and PND. Negative for chest pain and palpitations.  Gastrointestinal: Negative for abdominal pain, blood in stool, constipation, diarrhea, nausea and vomiting.  Genitourinary: Negative for dysuria, frequency, hematuria and urgency.  Musculoskeletal: Negative for falls.  Neurological: Negative for dizziness, tremors, focal weakness and headaches.  Endo/Heme/Allergies: Does not bruise/bleed easily.  Psychiatric/Behavioral: Negative for depression. The patient does not have insomnia.     VITAL SIGNS: Blood pressure (!) 134/50, pulse 80, temperature 98.6 F (37 C), temperature source Oral, resp. rate 20, height 5\' 3"  (1.6 m), weight 46.1 kg (101 lb 9.6 oz), SpO2 95 %.  PHYSICAL EXAMINATION:   GENERAL:  83 y.o.-year-old patient lying in the bed with no acute distress.  EYES: Pupils equal, round,  reactive to light and accommodation. No scleral icterus. Extraocular muscles intact.  HEENT: Head atraumatic, normocephalic. Oropharynx and nasopharynx clear.  NECK:  Supple, no jugular venous distention. No thyroid enlargement, no tenderness.  LUNGS: Diminished breath sounds bilaterally, no wheezing, rales,rhonchi screpitation. Intermittent use of accessory muscles of respiration, with exertion and speech.  CARDIOVASCULAR: S1, S2 normal. 3-6 systolic murmur in aortic auscultation side, no rubs, or gallops.  ABDOMEN: Soft, nontender, nondistended. Bowel sounds present. No organomegaly or mass.  EXTREMITIES: Trace lower extremity and pedal edema, no cyanosis, or clubbing.  NEUROLOGIC: Cranial nerves II through XII are intact. Muscle strength 5/5 in all extremities. Sensation intact. Gait not checked.  PSYCHIATRIC: The patient is alert and oriented x 3.  SKIN: No obvious rash, lesion, or ulcer.   ORDERS/RESULTS REVIEWED:   CBC  Recent Labs Lab 09/17/16 1932  WBC 7.3  HGB 11.3*  HCT 34.9*  PLT 195  MCV 91.3  MCH 29.5  MCHC 32.4  RDW 15.4*   ------------------------------------------------------------------------------------------------------------------  Chemistries   Recent Labs Lab 09/17/16 1932 09/18/16 0058 09/18/16 0614  NA 134*  --  138  K 3.7  --  3.6  CL 105  --  105  CO2 21*  --  24  GLUCOSE 118*  --  89  BUN 19  --  18  CREATININE 1.61*  --  1.62*  CALCIUM 8.6*  --  8.4*  MG  --  2.1  --    ------------------------------------------------------------------------------------------------------------------ estimated creatinine clearance is 16.5 mL/min (A) (by C-G formula based on SCr of 1.62 mg/dL (H)). ------------------------------------------------------------------------------------------------------------------  Recent Labs  09/18/16 0058  TSH 1.878    Cardiac Enzymes  Recent Labs Lab 09/18/16 0058 09/18/16 0614 09/18/16 1034  TROPONINI <0.03  <0.03 <0.03   ------------------------------------------------------------------------------------------------------------------ Invalid input(s): POCBNP ---------------------------------------------------------------------------------------------------------------  RADIOLOGY: Dg Chest 2 View  Result Date: 09/17/2016 CLINICAL DATA:  Shortness of breath and weakness today. EXAM: CHEST  2 VIEW COMPARISON:  08/14/2016 and prior exams FINDINGS: Cardiomegaly noted. Mild pulmonary vascular congestion present. Right pleural effusion and right basilar atelectasis noted. New airspace opacities/atelectasis within the lower left lung noted. There is no evidence of pneumothorax. IMPRESSION: New right pleural effusion and right basilar atelectasis and left lower lung airspace disease/ atelectasis. Cardiomegaly and mild pulmonary vascular congestion. Electronically Signed   By: Margarette Canada M.D.   On: 09/17/2016 20:06    EKG:  Orders placed or performed during the hospital encounter of 09/17/16  . ED EKG within 10 minutes  . ED EKG within 10 minutes  . EKG 12-Lead  . EKG 12-Lead    ASSESSMENT AND PLAN:  Active Problems:   Acute systolic heart failure due to valvular disease ([place])  #1. Acute on chronic systolic CHF due to severe aortic stenosis, continue patient on diuretics, following in's and outs, weight, blood pressure, kidney function, continue oxygen therapy, wean off oxygen as tolerated, patient may benefit from palliative care. Follow up as outpatient #2. Essential hypertension, improved with therapy #3. CK D stage IV, followed with diuresis, seems to be stable at present #4. Hyponatremia, resolved with diuresis, follow closely  Management plans discussed with the patient, family and they are in agreement.   DRUG ALLERGIES:  Allergies  Allergen Reactions  . Alendronate Sodium Other (See Comments)    REACTION: GI upset  . Simvastatin Other (See Comments)    Feet feel heavy    CODE  STATUS:     Code Status Orders        Start     Ordered   09/17/16 2221  Full code  Continuous     09/17/16 2220    Code Status History    Date Active Date Inactive Code Status Order ID Comments User Context   08/15/2016  7:35 AM 08/16/2016  2:40 PM Full Code [id number]  Harrie Foreman, MD Inpatient   06/06/2012  3:31 PM 06/08/2012  1:29 PM Full Code [id number]  Nani Skillern, PA Inpatient    Advance Directive Documentation     Most Recent Value  Type of Advance Directive  Healthcare Power of Attorney, Living will  Pre-existing out of facility DNR order (yellow form or pink MOST form)  -  "MOST" Form in Place?  -      TOTAL TIME TAKING CARE OF THIS PATIENT: 35 minutes.  Discussed with patient's family  Lynsee Wands M.D on 09/18/2016 at 1:08 PM  Between 7am to 6pm - Pager - [phone]  After 6pm go to [email or web address] - password [email or web address] [facility]  Office  [phone]  CC: Primary care physician; Rusty Aus, MD

## 2016-09-18 NOTE — Consult Note (Signed)
[facility]  CARDIOLOGY CONSULT NOTE  Patient ID: COUNTESS BIEBEL MRN: [id number] DOB/AGE: [birth year] 83 y.o.  Admit date: 09/17/2016 Referring Physician Ether Griffins Primary Physician [facility] Primary Cardiologist Amri Lien Reason for Consultation Congestive heart failure  HPI: 83 year old female referred for evaluation of congestive heart failure. The patient has known coronary artery disease, status post CABG, with dilated cardiomyopathy, chronic congestive heart failure, paroxysmal atrial fibrillation, stage IV chronic kidney disease, and moderate severe aortic stenosis. Patient currently is residing at home, as he experienced declining health over the last several months, with worsening shortness of breath. More recently, the patient was started on home O2 but has continued to experience shortness of breath with minimal exertion, and orthopnea. The patient denies chest pain. Admission labs were notable for negative troponin 3. ECG reveals atrial paced rhythm. Initial labs also were notable for BUN and creatinine 18 and 1.62 with a GFR of 27, consistent with stage 3-4 chronic kidney disease.  Review of systems complete and found to be negative unless listed above     Past Medical History:  Diagnosis Date  . Allergic rhinitis   . Anemia   . Anginal pain ([place])   . Coronary artery disease   . GERD (gastroesophageal reflux disease)   . Heart murmur   . Hyperlipidemia   . Hypertension   . Osteoarthritis of hand   . Osteopenia   . Recurrent pleural effusion on left   . Shortness of breath    "since the heart OR in 05/2012 cause of fluid" (08/17/2012)    Past Surgical History:  Procedure Laterality Date  . BREAST CYST ASPIRATION Left ~ 1948   "benign" (08/17/2012)  . carotid testing negative    . CHEST TUBE INSERTION Left 08/17/2012   Procedure: INSERTION PLEURAL DRAINAGE CATHETER;  Surgeon: Melrose Nakayama, MD;  Location: [facility];  Service: Thoracic;  Laterality: Left;  . COLONOSCOPY  9/02    polyp, hematochezia adenoma due in 5 yrs.  . CORONARY ANGIOPLASTY    . CORONARY ARTERY BYPASS GRAFT N/A 06/08/2012   Procedure: CORONARY ARTERY BYPASS GRAFTING (CABG);  Surgeon: Melrose Nakayama, MD;  Location: [facility];  Service: Open Heart Surgery;  Laterality: N/A;  . DEXA OP  03/02  . INSERTION / PLACEMENT PLEURAL CATHETER Left 08/17/2012  . stress Echo negative  7/04  . TEE WITHOUT CARDIOVERSION N/A 06/08/2012   Procedure: TRANSESOPHAGEAL ECHOCARDIOGRAM (TEE);  Surgeon: Melrose Nakayama, MD;  Location: [facility];  Service: Open Heart Surgery;  Laterality: N/A;  . TSpine XRay Scoliosis  11/09/2004   mild thoracic    Prescriptions Prior to Admission  Medication Sig Dispense Refill Last Dose  . acetaminophen (TYLENOL) 500 MG tablet Take 500 mg by mouth 2 (two) times daily.   08/14/2016 at Unknown time  . ALPRAZolam (XANAX) 0.25 MG tablet Take 0.25 mg by mouth at bedtime.    08/14/2016 at Unknown time  . apixaban (ELIQUIS) 2.5 MG TABS tablet Take 1 tablet by mouth daily.   08/14/2016 at Unknown time  . calcium carbonate (OSCAL) 1500 (600 Ca) MG TABS tablet Take 600 mg of elemental calcium by mouth 2 (two) times daily with a meal.     . cycloSPORINE (RESTASIS) 0.05 % ophthalmic emulsion Place 1 drop into both eyes 2 (two) times daily.     Marland Kitchen diltiazem (TIAZAC) 180 MG 24 hr capsule Take 1 capsule by mouth daily.   08/14/2016 at Unknown time  . ferrous sulfate 325 (65 FE) MG tablet Take 325  mg by mouth daily at 12 noon.   08/14/2016 at Unknown time  . furosemide (LASIX) 20 MG tablet Take 1 tablet (20 mg total) by mouth daily. 30 tablet 0   . hydroxychloroquine (PLAQUENIL) 200 MG tablet Take 1 tablet by mouth daily.     . hydroxypropyl methylcellulose (ISOPTO TEARS) 2.5 % ophthalmic solution Place 1 drop into both eyes 2 (two) times daily as needed (dry eyes).   prn at prn  . levothyroxine (SYNTHROID, LEVOTHROID) 50 MCG tablet Take 50 mcg by mouth daily before breakfast.    08/14/2016 at Unknown time  .  magnesium hydroxide (MILK OF MAGNESIA) 400 MG/5ML suspension Take 10 mLs by mouth daily as needed for constipation.   prn at prn  . meclizine (ANTIVERT) 25 MG tablet Take 25 mg by mouth 3 (three) times daily as needed for dizziness.     . metoprolol succinate (TOPROL-XL) 25 MG 24 hr tablet Take 25 mg by mouth daily.     Marland Kitchen omeprazole (PRILOSEC) 20 MG capsule Take 1 capsule by mouth as needed.     . polyethylene glycol powder (GLYCOLAX/MIRALAX) powder    prn at prn  . potassium chloride SA (K-DUR,KLOR-CON) 10 MEQ tablet Take 1 tablet (10 mEq total) by mouth daily. 30 tablet 0   . pravastatin (PRAVACHOL) 40 MG tablet Take 40 mg by mouth at bedtime.    08/14/2016 at Unknown time  . vitamin B-12 (CYANOCOBALAMIN) 1000 MCG tablet Take 1,000 mcg by mouth daily.   08/14/2016 at Unknown time   Social History   Social History  . Marital status: Widowed    Spouse name: N/A  . Number of children: N/A  . Years of education: N/A   Occupational History  . Not on file.   Social History Main Topics  . Smoking status: Never Smoker  . Smokeless tobacco: Never Used  . Alcohol use No  . Drug use: No  . Sexual activity: Not Currently   Other Topics Concern  . Not on file   Social History Narrative  . No narrative on file    Family History  Problem Relation Age of Onset  . Heart disease Mother   . Hypertension Mother   . Cancer Father 1       liver  . Alzheimer's disease Sister   . Hypertension Brother   . Sudden death Brother   . Hypertension Brother 59  . Heart disease Brother   . Hypertension Brother   . Alzheimer's disease Sister       Review of systems complete and found to be negative unless listed above      PHYSICAL EXAM  General: Well developed, well nourished, in no acute distress HEENT:  Normocephalic and atramatic Neck:  No JVD.  Lungs: Clear bilaterally to auscultation and percussion. Heart: HRRR . Normal S1 and S2 without gallops or murmurs.  Abdomen: Bowel sounds are  positive, abdomen soft and non-tender  Msk:  Back normal, normal gait. Normal strength and tone for age. Extremities: No clubbing, cyanosis or edema.   Neuro: Alert and oriented X 3. Psych:  Good affect, responds appropriately  Labs:   Lab Results  Component Value Date   WBC 7.3 09/17/2016   HGB 11.3 (L) 09/17/2016   HCT 34.9 (L) 09/17/2016   MCV 91.3 09/17/2016   PLT 195 09/17/2016    Recent Labs Lab 09/18/16 0614  NA 138  K 3.6  CL 105  CO2 24  BUN 18  CREATININE 1.62*  CALCIUM 8.4*  GLUCOSE 89   Lab Results  Component Value Date   CKTOTAL 37 08/14/2013   CKMB 0.9 08/14/2013   TROPONINI <0.03 09/18/2016    Lab Results  Component Value Date   CHOL 144 01/29/2013   CHOL 164 10/15/2008   CHOL 146 09/07/2007   Lab Results  Component Value Date   HDL 31 (L) 01/29/2013   HDL 37.50 (L) 10/15/2008   HDL 20.7 (L) 09/07/2007   Lab Results  Component Value Date   LDLCALC 97 01/29/2013   LDLCALC 106 (H) 10/15/2008   LDLCALC 107 (H) 09/07/2007   Lab Results  Component Value Date   TRIG 80 01/29/2013   TRIG 104.0 10/15/2008   TRIG 94 09/07/2007   Lab Results  Component Value Date   CHOLHDL 4 10/15/2008   CHOLHDL 7.1 CALC 09/07/2007   CHOLHDL 4.1 CALC 08/26/2006   No results found for: LDLDIRECT    Radiology: Dg Chest 2 View  Result Date: 09/17/2016 CLINICAL DATA:  Shortness of breath and weakness today. EXAM: CHEST  2 VIEW COMPARISON:  08/14/2016 and prior exams FINDINGS: Cardiomegaly noted. Mild pulmonary vascular congestion present. Right pleural effusion and right basilar atelectasis noted. New airspace opacities/atelectasis within the lower left lung noted. There is no evidence of pneumothorax. IMPRESSION: New right pleural effusion and right basilar atelectasis and left lower lung airspace disease/ atelectasis. Cardiomegaly and mild pulmonary vascular congestion. Electronically Signed   By: Margarette Canada M.D.   On: 09/17/2016 20:06    EKG: Atrial paced  rhythm  ASSESSMENT AND PLAN:   1. Acute on chronic systolic congestive heart failure, minimally improved after diuresis 2. CAD, status post CABG 3, without chest pain, with negative troponin 3. Paroxysmal atrial fibrillation 4. Stage IV chronic kidney disease 5. Moderate to severe aortic stenosis, not a candidate for TAVR in light of overall declining health  Recommendations  1. Agree with overall current therapy 2. Continue diuresis 3. Carefully monitor renal status 4. Consider palliative care consult 5. Await social work consult for placement  Signed: Isaias Cowman MD,PhD, [facility] 09/18/2016, 9:04 AM

## 2016-09-19 DIAGNOSIS — E876 Hypokalemia: Secondary | ICD-10-CM

## 2016-09-19 DIAGNOSIS — I10 Essential (primary) hypertension: Secondary | ICD-10-CM

## 2016-09-19 DIAGNOSIS — R531 Weakness: Secondary | ICD-10-CM

## 2016-09-19 DIAGNOSIS — N184 Chronic kidney disease, stage 4 (severe): Secondary | ICD-10-CM

## 2016-09-19 DIAGNOSIS — I350 Nonrheumatic aortic (valve) stenosis: Secondary | ICD-10-CM

## 2016-09-19 DIAGNOSIS — E871 Hypo-osmolality and hyponatremia: Secondary | ICD-10-CM

## 2016-09-19 LAB — BASIC METABOLIC PANEL
ANION GAP: 6 (ref 5–15)
BUN: 16 mg/dL (ref 6–20)
CALCIUM: 8.3 mg/dL — AB (ref 8.9–10.3)
CO2: 29 mmol/L (ref 22–32)
Chloride: 102 mmol/L (ref 101–111)
Creatinine, Ser: 1.55 mg/dL — ABNORMAL HIGH (ref 0.44–1.00)
GFR, EST AFRICAN AMERICAN: 33 mL/min — AB (ref >60)
GFR, EST NON AFRICAN AMERICAN: 28 mL/min — AB (ref >60)
Glucose, Bld: 83 mg/dL (ref 65–99)
Potassium: 3.3 mmol/L — ABNORMAL LOW (ref 3.5–5.1)
Sodium: 137 mmol/L (ref 135–145)

## 2016-09-19 MED ORDER — POTASSIUM CHLORIDE CRYS ER 20 MEQ PO TBCR
40.0000 meq | EXTENDED_RELEASE_TABLET | Freq: Once | ORAL | Status: AC
  Administered 2016-09-19: 40 meq via ORAL
  Filled 2016-09-19: qty 2

## 2016-09-19 MED ORDER — FUROSEMIDE 40 MG PO TABS: 40.0000 mg | ORAL_TABLET | Freq: Every day | ORAL | 11 refills | Status: DC

## 2016-09-19 MED ORDER — POTASSIUM CHLORIDE CRYS ER 20 MEQ PO TBCR
20.0000 meq | EXTENDED_RELEASE_TABLET | Freq: Every day | ORAL | Status: DC
  Administered 2016-09-20: 20 meq via ORAL
  Filled 2016-09-19: qty 1

## 2016-09-19 MED ORDER — MAGNESIUM OXIDE 400 (241.3 MG) MG PO TABS
400.0000 mg | ORAL_TABLET | Freq: Every day | ORAL | Status: AC
  Administered 2016-09-19 – 2016-09-20 (×2): 400 mg via ORAL
  Filled 2016-09-19 (×2): qty 1

## 2016-09-19 MED ORDER — MAGNESIUM OXIDE 400 (241.3 MG) MG PO TABS: 400.0000 mg | ORAL_TABLET | Freq: Every day | ORAL | 3 refills | Status: DC

## 2016-09-19 MED ORDER — POTASSIUM CHLORIDE CRYS ER 20 MEQ PO TBCR: 20.0000 meq | EXTENDED_RELEASE_TABLET | Freq: Every day | ORAL | 3 refills | Status: DC

## 2016-09-19 NOTE — Plan of Care (Signed)
Problem: Acute Rehab PT Goals(only PT should resolve) Goal: Pt will Roll Supine to Side Outcome: Progressing Pt will be independent with bed mobility to be able to self adjust body weight in bed.  Goal: Patient Will Transfer Sit To/From Stand Patient will be able to perform sit to stand independently to prepare to ambulate to the restroom.  Goal: Pt Will Ambulate Patient will be able to ambulate without an AD >125 without O2 desaturation <90% to allow for ambulation around house.

## 2016-09-19 NOTE — Evaluation (Signed)
Physical Therapy Evaluation Patient Details Name: [name] MRN: [id number] DOB: [birth year] Today's Date: 09/19/2016   History of Present Illness  Pt is a 83 yo right hand dominant female presenting with exaccerbation of CHF condition with DOE and increased breathlessness. Patient presents with PMH of CHF, GERD, HTN, HLD and demonstrates an EF of 30-35%. Patient reports she lives alone.   Clinical Impression  Pt is a 83 yo female with a history of CHF presenting with increased breathlessness to the hospital. Patient demonstrates good functional balance and strength with limitations when performing high level of balance activities. However patient demonstrate O2 desaturation after 165ft of walking requiring a 1 min standing rest break and education on pursed lip breathing to return O2 to normal levels. (~93% of SpO2) Patient again demonstrates oxygen desaturation when ambulating back into her room requiring another standing rest break to return to normal limits. Patient also uneducated on self management of tubing for O2 and home safety with O2. Patient will benefit Home Health PT at discharge to address those limitations and help improve cardiovascular situation.     Follow Up Recommendations Home health PT    Equipment Recommendations  Rolling walker with 5" wheels    Recommendations for Other Services       Precautions / Restrictions        Mobility  Bed Mobility Overal bed mobility: Independent             General bed mobility comments: Patient able to perform bed mobility independently  Transfers Overall transfer level: Needs assistance Equipment used: None Transfers: Sit to/from Stand Sit to Stand: Supervision;Independent         General transfer comment: Patient performs sit to stand slowly requiring increased time to perform.   Ambulation/Gait Ambulation/Gait assistance: Min guard Ambulation Distance (Feet): 160 Feet Assistive device: None Gait  Pattern/deviations: Decreased stride length;Narrow base of support   Gait velocity interpretation: Below normal speed for age/gender General Gait Details: Patient demonstrates slow gait speed, one episode of LOB but able to self adjust.  Stairs            Wheelchair Mobility    Modified Rankin (Stroke Patients Only)       Balance Overall balance assessment: Needs assistance Sitting-balance support: No upper extremity supported Sitting balance-Leahy Scale: Good Sitting balance - Comments: Patient able to maintain sitting balance without support Postural control: Posterior lean Standing balance support: No upper extremity supported Standing balance-Leahy Scale: Fair Standing balance comment: Patient unable to tolerate challenage but is able to shift weigh independently  Single Leg Stance - Right Leg: 2 Single Leg Stance - Left Leg: 0 Tandem Stance - Right Leg: 10 (semi tandem) Tandem Stance - Left Leg: 10 (semi tandem ) [place]berg - Eyes Opened: 30 [place] - Eyes Closed: 30                 Pertinent Vitals/Pain Pain Assessment: No/denies pain    Home Living Family/patient expects to be discharged to:: Private residence Living Arrangements: Alone Available Help at Discharge: Other (Comment) (Family reports to be able to assist intermittently) Type of Home: House Home Access: Level entry     Home Layout: One level        Prior Function Level of Independence: Independent         Comments: Patient reports she received Portable home oxygen on 09/17/16.      Hand Dominance   Dominant Hand: Right    Extremity/Trunk Assessment   Upper  Extremity Assessment Upper Extremity Assessment: Overall WFL for tasks assessed    Lower Extremity Assessment Lower Extremity Assessment: Generalized weakness       Communication      Cognition Arousal/Alertness: Awake/alert Behavior During Therapy: WFL for tasks assessed/performed Overall Cognitive Status: Within  Functional Limits for tasks assessed                                        General Comments      Exercises Other Exercises Other Exercises: Patient performed 176ft of ambulation; demonstrates 2 standing rest breaks to perform secondary to oxygen desaturation (87% O2 reading requiring 1 minute of pursed lip breathing to correct to 93%), SLS x 5 sec B, Semi tandem stance 30sec B, SIt to stands x 8   Assessment/Plan    PT Assessment Patient needs continued PT services  PT Problem List Decreased strength;Decreased range of motion;Decreased balance;Pain;Decreased mobility;Decreased coordination;Impaired sensation;Decreased activity tolerance       PT Treatment Interventions Gait training;Stair training;Therapeutic activities;Therapeutic exercise;Balance training;Neuromuscular re-education;Patient/family education    PT Goals (Current goals can be found in the Care Plan section)  Acute Rehab PT Goals Patient Stated Goal: to go home PT Goal Formulation: With patient/family Time For Goal Achievement: 10/03/16 Potential to Achieve Goals: Good    Frequency Min 2X/week   Barriers to discharge Other (comment) Lives alone, desaturation of spO2% after 117ft of ambulation     Co-evaluation               AM-PAC PT "6 Clicks" Daily Activity  Outcome Measure Difficulty turning over in bed (including adjusting bedclothes, sheets and blankets)?: None Difficulty moving from lying on back to sitting on the side of the bed? : None Difficulty sitting down on and standing up from a chair with arms (e.g., wheelchair, bedside commode, etc,.)?: None Help needed moving to and from a bed to chair (including a wheelchair)?: None Help needed walking in hospital room?: A Little Help needed climbing 3-5 steps with a railing? : A Little 6 Click Score: 22    End of Session Equipment Utilized During Treatment: Gait belt Activity Tolerance: Patient tolerated treatment well Patient  left: in bed;with call bell/phone within reach;with bed alarm set Nurse Communication: Mobility status;Other (comment) (Desaturation with ambulation ) PT Visit Diagnosis: Unsteadiness on feet (R26.81);Muscle weakness (generalized) (M62.81);Difficulty in walking, not elsewhere classified (R26.2)    Time: 5852-7782 PT Time Calculation (min) (ACUTE ONLY): 23 min   Charges:   PT Evaluation $PT Eval Moderate Complexity: 1 Procedure PT Treatments $Gait Training: 8-22 mins $Therapeutic Exercise: 8-22 mins   PT G CodesBlythe Stanford, PT DPT 09/19/16, 2:22 PM [phone] [place] 09/19/2016, 2:17 PM

## 2016-09-19 NOTE — Progress Notes (Signed)
[facility]  SUBJECTIVE: I feel better   Vitals:   09/18/16 1939 09/19/16 0500 09/19/16 0519 09/19/16 0759  BP: (!) 135/59  (!) 140/58 (!) 146/59  Pulse: 99  81 87  Resp: 18  18   Temp: 98.4 F (36.9 C)  97.7 F (36.5 C) 98 F (36.7 C)  TempSrc: Oral  Oral Oral  SpO2: 97%  99% 97%  Weight:  46 kg (101 lb 6.4 oz)    Height:         Intake/Output Summary (Last 24 hours) at 09/19/16 0915 Last data filed at 09/19/16 6195  Gross per 24 hour  Intake                0 ml  Output             2900 ml  Net            -2900 ml      PHYSICAL EXAM  General: Well developed, well nourished, in no acute distress HEENT:  Normocephalic and atramatic Neck:  No JVD.  Lungs: Clear bilaterally to auscultation and percussion. Heart: HRRR . Normal S1 and S2 without gallops or murmurs.  Abdomen: Bowel sounds are positive, abdomen soft and non-tender  Msk:  Back normal, normal gait. Normal strength and tone for age. Extremities: No clubbing, cyanosis or edema.   Neuro: Alert and oriented X 3. Psych:  Good affect, responds appropriately   LABS: Basic Metabolic Panel:  Recent Labs  09/18/16 0058 09/18/16 0614 09/19/16 0449  NA  --  138 137  K  --  3.6 3.3*  CL  --  105 102  CO2  --  24 29  GLUCOSE  --  89 83  BUN  --  18 16  CREATININE  --  1.62* 1.55*  CALCIUM  --  8.4* 8.3*  MG 2.1  --   --    Liver Function Tests: No results for input(s): AST, ALT, ALKPHOS, BILITOT, PROT, ALBUMIN in the last 72 hours. No results for input(s): LIPASE, AMYLASE in the last 72 hours. CBC:  Recent Labs  09/17/16 1932  WBC 7.3  HGB 11.3*  HCT 34.9*  MCV 91.3  PLT 195   Cardiac Enzymes:  Recent Labs  09/18/16 0058 09/18/16 0614 09/18/16 1034  TROPONINI <0.03 <0.03 <0.03   BNP: Invalid input(s): POCBNP D-Dimer: No results for input(s): DDIMER in the last 72 hours. Hemoglobin A1C: No results for input(s): HGBA1C in the last 72 hours. Fasting Lipid Panel: No results for  input(s): CHOL, HDL, LDLCALC, TRIG, CHOLHDL, LDLDIRECT in the last 72 hours. Thyroid Function Tests:  Recent Labs  09/18/16 0058  TSH 1.878   Anemia Panel: No results for input(s): VITAMINB12, FOLATE, FERRITIN, TIBC, IRON, RETICCTPCT in the last 72 hours.  Dg Chest 2 View  Result Date: 09/17/2016 CLINICAL DATA:  Shortness of breath and weakness today. EXAM: CHEST  2 VIEW COMPARISON:  08/14/2016 and prior exams FINDINGS: Cardiomegaly noted. Mild pulmonary vascular congestion present. Right pleural effusion and right basilar atelectasis noted. New airspace opacities/atelectasis within the lower left lung noted. There is no evidence of pneumothorax. IMPRESSION: New right pleural effusion and right basilar atelectasis and left lower lung airspace disease/ atelectasis. Cardiomegaly and mild pulmonary vascular congestion. Electronically Signed   By: Margarette Canada M.D.   On: 09/17/2016 20:06     Echo   TELEMETRY: Atrial paced rhythm:  ASSESSMENT AND PLAN:  Active Problems:   Acute systolic heart failure due to  valvular disease ([place])    1. Acute on chronic systolic congestive heart failure, improved after diuresis 2. CAD, status post CABG 3, without chest pain, with negative troponin 3. Paroxysmal atrial fibrillation 4. Moderate to severe aortic stenosis, not felt to be a candidate for TAVR 5. Chronic kidney disease, stage IV  Recommendations  1. Agree with current therapy 2. Continue diuresis 3. Carefully monitor renal status 4. Social work consult to evaluate for assisted living facility 5. Palliative care consult pending  Sign off for now, please call if any questions  Isaias Cowman, MD, PhD, [facility] 09/19/2016 9:15 AM

## 2016-09-19 NOTE — Progress Notes (Signed)
[place] [facility] [place] NAME: [name]    MR#:  [id number]  DATE OF BIRTH:  [birth year]  SUBJECTIVE:  CHIEF COMPLAINT:   Chief Complaint  Patient presents with  . Shortness of Breath  The patient is 83 year old Caucasian female with medical history significant for history of coronary artery disease, systolic CHF, cardiomyopathy, ejection fraction of 30-35%, severe aortic stenosis, hypertension, hyperlipidemia, osteopenia, who presents to the hospital with complaints of shortness of breath for the past one week, dyspnea on exertion, orthopnea. She was just initiated on home oxygen therapy. On arrival to the hospital, she was noted to have congestive heart failure and was admitted. She feels better now since 4.0 L of diuresis. Patient was seen by physical therapist, recommended questionable assisted living facility placement. Discussed with care management, social worker, and recommended to keep patient for one more day to sort out disposition. Lasix was changed to once a day intravenously. Patient feels comfortable today, denies any shortness of breath. No chest pains  Review of Systems  Constitutional: Negative for chills, fever and weight loss.  HENT: Negative for congestion.   Eyes: Negative for blurred vision and double vision.  Respiratory: Positive for cough. Negative for sputum production, shortness of breath and wheezing.   Cardiovascular: Positive for orthopnea, leg swelling and PND. Negative for chest pain and palpitations.  Gastrointestinal: Negative for abdominal pain, blood in stool, constipation, diarrhea, nausea and vomiting.  Genitourinary: Negative for dysuria, frequency, hematuria and urgency.  Musculoskeletal: Negative for falls.  Neurological: Negative for dizziness, tremors, focal weakness and headaches.  Endo/Heme/Allergies: Does not bruise/bleed easily.  Psychiatric/Behavioral: Negative for depression. The patient  does not have insomnia.     VITAL SIGNS: Blood pressure (!) 152/58, pulse 95, temperature 98.6 F (37 C), temperature source Oral, resp. rate 18, height 5\' 3"  (1.6 m), weight 46 kg (101 lb 6.4 oz), SpO2 97 %.  PHYSICAL EXAMINATION:   GENERAL:  83 y.o.-year-old patient lying in the bed with no acute distress.  EYES: Pupils equal, round, reactive to light and accommodation. No scleral icterus. Extraocular muscles intact.  HEENT: Head atraumatic, normocephalic. Oropharynx and nasopharynx clear.  NECK:  Supple, no jugular venous distention. No thyroid enlargement, no tenderness.  LUNGS: Diminished breath sounds bilaterally, no wheezing, rales,rhonchi screpitation. No use of accessory muscles of respiration.  CARDIOVASCULAR: S1, S2 normal. 3-6 systolic murmur in aortic auscultation side, no rubs, or gallops.  ABDOMEN: Soft, nontender, nondistended. Bowel sounds present. No organomegaly or mass.  EXTREMITIES: Trace lower extremity and pedal edema, no cyanosis, or clubbing.  NEUROLOGIC: Cranial nerves II through XII are intact. Muscle strength 5/5 in all extremities. Sensation intact. Gait not checked.  PSYCHIATRIC: The patient is alert and oriented x 3.  SKIN: No obvious rash, lesion, or ulcer.   ORDERS/RESULTS REVIEWED:   CBC  Recent Labs Lab 09/17/16 1932  WBC 7.3  HGB 11.3*  HCT 34.9*  PLT 195  MCV 91.3  MCH 29.5  MCHC 32.4  RDW 15.4*   ------------------------------------------------------------------------------------------------------------------  Chemistries   Recent Labs Lab 09/17/16 1932 09/18/16 0058 09/18/16 0614 09/19/16 0449  NA 134*  --  138 137  K 3.7  --  3.6 3.3*  CL 105  --  105 102  CO2 21*  --  24 29  GLUCOSE 118*  --  89 83  BUN 19  --  18 16  CREATININE 1.61*  --  1.62* 1.55*  CALCIUM 8.6*  --  8.4* 8.3*  MG  --  2.1  --   --     ------------------------------------------------------------------------------------------------------------------ estimated creatinine clearance is 17.2 mL/min (A) (by C-G formula based on SCr of 1.55 mg/dL (H)). ------------------------------------------------------------------------------------------------------------------  Recent Labs  09/18/16 0058  TSH 1.878    Cardiac Enzymes  Recent Labs Lab 09/18/16 0058 09/18/16 0614 09/18/16 1034  TROPONINI <0.03 <0.03 <0.03   ------------------------------------------------------------------------------------------------------------------ Invalid input(s): POCBNP ---------------------------------------------------------------------------------------------------------------  RADIOLOGY: Dg Chest 2 View  Result Date: 09/17/2016 CLINICAL DATA:  Shortness of breath and weakness today. EXAM: CHEST  2 VIEW COMPARISON:  08/14/2016 and prior exams FINDINGS: Cardiomegaly noted. Mild pulmonary vascular congestion present. Right pleural effusion and right basilar atelectasis noted. New airspace opacities/atelectasis within the lower left lung noted. There is no evidence of pneumothorax. IMPRESSION: New right pleural effusion and right basilar atelectasis and left lower lung airspace disease/ atelectasis. Cardiomegaly and mild pulmonary vascular congestion. Electronically Signed   By: Margarette Canada M.D.   On: 09/17/2016 20:06    EKG:  Orders placed or performed during the hospital encounter of 09/17/16  . ED EKG within 10 minutes  . ED EKG within 10 minutes  . EKG 12-Lead  . EKG 12-Lead    ASSESSMENT AND PLAN:  Active Problems:   Acute systolic heart failure due to valvular disease (H[facility])   Severe aortic stenosis   Essential hypertension   CKD (chronic kidney disease), stage IV (H[facility])   Generalized weakness   Hyponatremia   Hypokalemia  #1. Acute on chronic systolic CHF due to severe aortic stenosis, continue patient on diuretics,Now on  once daily, intravenous Lasix, appreciate cardiologist input, following in's and outs, weight, blood pressure, kidney function, continue oxygen therapy, wean off oxygen as tolerated, the patient will be seen by palliative care tomorrow, would benefit from palliative care care follow-up as outpatient  #2. Essential hypertension, improved with therapy #3. CK D stage IV, followed with diuresis, seems to be stable at present #4. Hyponatremia, resolved with diuresis, follow closely #5. Hypokalemia, supplementing orally, adding magnesium, follow potassium level in the morning #6. Generalized weakness, physical therapist recommendations are pending, patient will stay here until discharge plan is generated, problem with physical therapist assessment, discussed with social worker  Management plans discussed with the patient, family and they are in agreement.   DRUG ALLERGIES:  Allergies  Allergen Reactions  . Alendronate Sodium Other (See Comments)    REACTION: GI upset  . Simvastatin Other (See Comments)    Feet feel heavy    CODE STATUS:     Code Status Orders        Start     Ordered   09/17/16 2221  Full code  Continuous     09/17/16 2220    Code Status History    Date Active Date Inactive Code Status Order ID Comments User Context   08/15/2016  7:35 AM 08/16/2016  2:40 PM Full Code [id number]  Harrie Foreman, MD Inpatient   06/06/2012  3:31 PM 06/08/2012  1:29 PM Full Code [id number]  Nani Skillern, PA Inpatient    Advance Directive Documentation     Most Recent Value  Type of Advance Directive  Healthcare Power of Attorney, Living will  Pre-existing out of facility DNR order (yellow form or pink MOST form)  -  "MOST" Form in Place?  -      TOTAL TIME TAKING CARE OF THIS PATIENT: 35 minutes.  Discussed with patient's family  Breea Loncar M.D on 09/19/2016 at 1:06 PM  Between 7am to 6pm - Pager - [phone]  After 6pm go to [email or web address] - password [email or web address] [facility]  Office  [phone]  CC: Primary care physician; Rusty Aus, MD

## 2016-09-19 NOTE — Clinical Social Work Note (Signed)
Clinical Social Work Assessment  Patient Details  Name: [name] MRN: [id number] Date of Birth: June 23, 1923  Date of referral:  09/19/16               Reason for consult:  Facility Placement                Permission sought to share information with:  Chartered certified accountant granted to share information::  Yes, Verbal Permission Granted  Name::        Agency::     Relationship::     Contact Information:     Housing/Transportation Living arrangements for the past 2 months:  Single Family Home Source of Information:  Adult Children Patient Interpreter Needed:  None Criminal Activity/Legal Involvement Pertinent to Current Situation/Hospitalization:  No - Comment as needed Significant Relationships:  Adult Children, Community Support Lives with:  Adult Children Do you feel safe going back to the place where you live?  Yes Need for family participation in patient care:  Yes (Comment)  Care giving concerns:  Possible ALF placement; PT recommendation pending   Social Worker assessment / plan:  CSW spoke with the patient's daughter about her concerns. The patient's daughter and son-in-law want to pursue ALF placement for the patient, and they were told by different providers in [facility] that such could be facilitated. The CSW advised that while the patient can discharge from the hospital to an ALF, an ALF would need to assess the patient which is not an option on the weekend. The family verbalized understanding. The CSW provided emotional support as the daughter seems to show evidence of caregiver fatigue. The CSW gave the family a list of area ALFs and information regarding how to facilitate an admission through their PCP should the patient be cleared medically to discharge on the weekend.  The patient has shown a sharp decline in physical functioning in the past 30 days. The patient is in agreement with discharge to an ALF. PT recommendation is pending. The family has given  verbal permission to conduct a SNF bed search should PT recommend such. The CSW will continue to follow to assist with discharge planning and caregiver support.  Employment status:  Retired Nurse, adult PT Recommendations:  Not assessed at this time Information / Referral to community resources:  [place]  Patient/Family's Response to care:  The patient's daughter thanked the CSW for assistance.  Patient/Family's Understanding of and Emotional Response to Diagnosis, Current Treatment, and Prognosis:  The patient's family is understandably confused about different levels of care. The family and patient do understand that the patient needs assistance that cannot be provided in the home setting.  Emotional Assessment Appearance:  Appears stated age Attitude/Demeanor/Rapport:   (Pleasant) Affect (typically observed):  Pleasant Orientation:  Oriented to Self, Oriented to Place, Oriented to  Time, Oriented to Situation Alcohol / Substance use:  Never Used Psych involvement (Current and /or in the community):  No (Comment)  Discharge Needs  Concerns to be addressed:  Discharge Planning Concerns, Care Coordination Readmission within the last 30 days:  No Current discharge risk:  Chronically ill Barriers to Discharge:  Continued Medical Work up   [employer], LCSW 09/19/2016, 1:04 PM

## 2016-09-20 DIAGNOSIS — Z66 Do not resuscitate: Secondary | ICD-10-CM

## 2016-09-20 DIAGNOSIS — I350 Nonrheumatic aortic (valve) stenosis: Secondary | ICD-10-CM

## 2016-09-20 DIAGNOSIS — R531 Weakness: Secondary | ICD-10-CM

## 2016-09-20 DIAGNOSIS — Z515 Encounter for palliative care: Secondary | ICD-10-CM

## 2016-09-20 LAB — BASIC METABOLIC PANEL
ANION GAP: 8 (ref 5–15)
BUN: 17 mg/dL (ref 6–20)
CHLORIDE: 105 mmol/L (ref 101–111)
CO2: 27 mmol/L (ref 22–32)
Calcium: 8.6 mg/dL — ABNORMAL LOW (ref 8.9–10.3)
Creatinine, Ser: 1.65 mg/dL — ABNORMAL HIGH (ref 0.44–1.00)
GFR calc Af Amer: 30 mL/min — ABNORMAL LOW (ref >60)
GFR calc non Af Amer: 26 mL/min — ABNORMAL LOW (ref >60)
Glucose, Bld: 92 mg/dL (ref 65–99)
POTASSIUM: 3.8 mmol/L (ref 3.5–5.1)
SODIUM: 140 mmol/L (ref 135–145)

## 2016-09-20 MED ORDER — POTASSIUM CHLORIDE CRYS ER 20 MEQ PO TBCR: 20.0000 meq | EXTENDED_RELEASE_TABLET | Freq: Every day | ORAL | 3 refills | Status: DC

## 2016-09-20 MED ORDER — FUROSEMIDE 40 MG PO TABS: 40.0000 mg | ORAL_TABLET | Freq: Every day | ORAL | 11 refills | Status: DC

## 2016-09-20 MED ORDER — MAGNESIUM OXIDE 400 (241.3 MG) MG PO TABS: 400.0000 mg | ORAL_TABLET | Freq: Every day | ORAL | 3 refills | Status: AC

## 2016-09-20 NOTE — Consult Note (Signed)
   [facility] CM Inpatient Consult   09/20/2016  Jenaya Saar [facility] December 21, 1923 [id number]    [facility] Care Management referral received. Chart reviewed. Noted Palliative Medicine consult pending. Also noted plans were for possible ALF. Will continue to follow and engage for [place] Management program if appropriate.   Marthenia Rolling, MSN-Ed, RN,BSN [employer] [facility] Liaison [phone]

## 2016-09-20 NOTE — Care Management (Signed)
Patient with recent admission for heart failure and declined home health and [facility] Failure Clinic referral.  She is now agreeable and CM also made a thn referral  which now may not be indicated.  She recently qualified for continuous home oxygen through [facility].  Patient and her son now agreeable to home with through [place]. Son informed CSW that he may need some help finding assisted living level of placement for patient.  Palliative met with son today and patient has been made a DNR. In home palliative consult has been ordered. Have reached out to [facility] for referral

## 2016-09-20 NOTE — Discharge Summary (Signed)
[facility] [place] NAME: [name]    MR#:  [id number]  DATE OF BIRTH:  01/15/1924  DATE OF ADMISSION:  09/17/2016 ADMITTING PHYSICIAN: Ubaldo Glassing Hugelmeyer, DO  DATE OF DISCHARGE: No discharge date for patient encounter.  PRIMARY CARE PHYSICIAN: Rusty Aus, MD     ADMISSION DIAGNOSIS:  Acute on chronic systolic congestive heart failure (HCC) [I50.23] Aortic valve stenosis, etiology of cardiac valve disease unspecified [I35.0]  DISCHARGE DIAGNOSIS:  Active Problems:   Acute systolic heart failure due to valvular disease (HCC)   Severe aortic stenosis   Essential hypertension   CKD (chronic kidney disease), stage IV (HCC)   Generalized weakness   Hyponatremia   Hypokalemia   SECONDARY DIAGNOSIS:   Past Medical History:  Diagnosis Date  . Allergic rhinitis   . Anemia   . Anginal pain ([facility])   . Coronary artery disease   . GERD (gastroesophageal reflux disease)   . Heart murmur   . Hyperlipidemia   . Hypertension   . Osteoarthritis of hand   . Osteopenia   . Recurrent pleural effusion on left   . Shortness of breath    "since the heart OR in 05/2012 cause of fluid" (08/17/2012)    .pro HOSPITAL COURSE:  The patient is 83 year old Caucasian female with medical history significant for history of coronary artery disease, systolic CHF, cardiomyopathy, ejection fraction of 30-35%, severe aortic stenosis, hypertension, hyperlipidemia, osteopenia, who presents to the hospital with complaints of shortness of breath for the past one week, dyspnea on exertion, orthopnea. She was just initiated on home oxygen therapy. On arrival to the hospital, she was noted to have congestive heart failure and was admitted. She feels better now since 4.26 L of diuresis. Patient was seen by physical therapist, recommended Home with home health services. Patient feels comfortable today, denies any shortness of breath. No chest pains. She was  felt to be stable to be discharged home with home health services and palliative care follow-up. Discussion by problem: #1. Acute on chronic systolic CHF due to cardiomyopathy and severe aortic stenosis, continue patient on diuretic Lasix orally twice a day, appreciate cardiologist input, follow weight, blood pressure as outpatient, continue to observe kidney function labs as outpatient, continue oxygen therapy, follow-up by palliative care  at home #2. Essential hypertension, improved with therapy #3. CK D stage IV, followed with diuresis, seems to be stable at present #4. Hyponatremia, resolved with diuresis, follow closely #5. Hypokalemia, supplemented orally, magnesium level was normal, follow potassium level as outpatient since dose was advanced #6. Generalized weakness, physical therapist recommended home health services ., patient will be followed by palliative care as outpatient as well  DISCHARGE CONDITIONS:   stable  CONSULTS OBTAINED:  Treatment Team:  Isaias Cowman, MD  DRUG ALLERGIES:   Allergies  Allergen Reactions  . Alendronate Sodium Other (See Comments)    REACTION: GI upset  . Simvastatin Other (See Comments)    Feet feel heavy    DISCHARGE MEDICATIONS:   Current Discharge Medication List    START taking these medications   Details  magnesium oxide (MAG-OX) 400 (241.3 Mg) MG tablet Take 1 tablet (400 mg total) by mouth daily. Qty: 30 tablet, Refills: 3      CONTINUE these medications which have CHANGED   Details  furosemide (LASIX) 40 MG tablet Take 1 tablet (40 mg total) by mouth daily. Can have one additional pill a day as needed, thank  you Qty: 60 tablet, Refills: 11    potassium chloride SA (K-DUR,KLOR-CON) 20 MEQ tablet Take 1 tablet (20 mEq total) by mouth daily. Qty: 30 tablet, Refills: 3      CONTINUE these medications which have NOT CHANGED   Details  acetaminophen (TYLENOL) 500 MG tablet Take 500 mg by mouth 2 (two) times daily.      ALPRAZolam (XANAX) 0.25 MG tablet Take 0.25 mg by mouth at bedtime.     apixaban (ELIQUIS) 2.5 MG TABS tablet Take 1 tablet by mouth daily.    calcium carbonate (OSCAL) 1500 (600 Ca) MG TABS tablet Take 600 mg of elemental calcium by mouth 2 (two) times daily with a meal.    cycloSPORINE (RESTASIS) 0.05 % ophthalmic emulsion Place 1 drop into both eyes 2 (two) times daily.    ferrous sulfate 325 (65 FE) MG tablet Take 325 mg by mouth daily at 12 noon.    hydroxychloroquine (PLAQUENIL) 200 MG tablet Take 1 tablet by mouth daily.    hydroxypropyl methylcellulose (ISOPTO TEARS) 2.5 % ophthalmic solution Place 1 drop into both eyes 2 (two) times daily as needed (dry eyes).    levothyroxine (SYNTHROID, LEVOTHROID) 50 MCG tablet Take 50 mcg by mouth daily before breakfast.     magnesium hydroxide (MILK OF MAGNESIA) 400 MG/5ML suspension Take 10 mLs by mouth daily as needed for constipation.    meclizine (ANTIVERT) 25 MG tablet Take 25 mg by mouth 3 (three) times daily as needed for dizziness.    omeprazole (PRILOSEC) 20 MG capsule Take 1 capsule by mouth as needed.    polyethylene glycol powder (GLYCOLAX/MIRALAX) powder     pravastatin (PRAVACHOL) 40 MG tablet Take 40 mg by mouth at bedtime.     vitamin B-12 (CYANOCOBALAMIN) 1000 MCG tablet Take 1,000 mcg by mouth daily.      STOP taking these medications     diltiazem (TIAZAC) 180 MG 24 hr capsule      metoprolol succinate (TOPROL-XL) 25 MG 24 hr tablet          DISCHARGE INSTRUCTIONS:    The patient is to follow-up with primary care physician and cardiologist, palliative care, home health, CHF clinic as outpatient   If you experience worsening of your admission symptoms, develop shortness of breath, life threatening emergency, suicidal or homicidal thoughts you must seek medical attention immediately by calling 911 or calling your MD immediately  if symptoms less severe.  You Must read complete instructions/literature  along with all the possible adverse reactions/side effects for all the Medicines you take and that have been prescribed to you. Take any new Medicines after you have completely understood and accept all the possible adverse reactions/side effects.   Please note  You were cared for by a hospitalist during your hospital stay. If you have any questions about your discharge medications or the care you received while you were in the hospital after you are discharged, you can call the unit and asked to speak with the hospitalist on call if the hospitalist that took care of you is not available. Once you are discharged, your primary care physician will handle any further medical issues. Please note that NO REFILLS for any discharge medications will be authorized once you are discharged, as it is imperative that you return to your primary care physician (or establish a relationship with a primary care physician if you do not have one) for your aftercare needs so that they can reassess your need for medications and monitor  your lab values.    Today   CHIEF COMPLAINT:   Chief Complaint  Patient presents with  . Shortness of Breath    HISTORY OF PRESENT ILLNESS:     VITAL SIGNS:  Blood pressure (!) 130/58, pulse 90, temperature 98.3 F (36.8 C), temperature source Oral, resp. rate 16, height 5\' 3"  (1.6 m), weight 43.2 kg (95 lb 4.8 oz), SpO2 (!) 87 %.  I/O:   Intake/Output Summary (Last 24 hours) at 09/20/16 1427 Last data filed at 09/20/16 1345  Gross per 24 hour  Intake              720 ml  Output              900 ml  Net             -180 ml    PHYSICAL EXAMINATION:  GENERAL:  83 y.o.-year-old patient lying in the bed with no acute distress.  EYES: Pupils equal, round, reactive to light and accommodation. No scleral icterus. Extraocular muscles intact.  HEENT: Head atraumatic, normocephalic. Oropharynx and nasopharynx clear.  NECK:  Supple, no jugular venous distention. No thyroid  enlargement, no tenderness.  LUNGS: Normal breath sounds bilaterally, no wheezing, rales,rhonchi or crepitation. No use of accessory muscles of respiration.  CARDIOVASCULAR: S1, S2 normal. No murmurs, rubs, or gallops.  ABDOMEN: Soft, non-tender, non-distended. Bowel sounds present. No organomegaly or mass.  EXTREMITIES: No pedal edema, cyanosis, or clubbing.  NEUROLOGIC: Cranial nerves II through XII are intact. Muscle strength 5/5 in all extremities. Sensation intact. Gait not checked.  PSYCHIATRIC: The patient is alert and oriented x 3.  SKIN: No obvious rash, lesion, or ulcer.   DATA REVIEW:   CBC  Recent Labs Lab 09/17/16 1932  WBC 7.3  HGB 11.3*  HCT 34.9*  PLT 195    Chemistries   Recent Labs Lab 09/18/16 0058  09/20/16 0322  NA  --   < > 140  K  --   < > 3.8  CL  --   < > 105  CO2  --   < > 27  GLUCOSE  --   < > 92  BUN  --   < > 17  CREATININE  --   < > 1.65*  CALCIUM  --   < > 8.6*  MG 2.1  --   --   < > = values in this interval not displayed.  Cardiac Enzymes  Recent Labs Lab 09/18/16 1034  TROPONINI <0.03    Microbiology Results  Results for orders placed or performed during the hospital encounter of 06/15/15  Urine culture     Status: None   Collection Time: 06/15/15  3:28 AM  Result Value Ref Range Status   Specimen Description URINE, RANDOM  Final   Special Requests NONE  Final   Culture INSIGNIFICANT GROWTH  Final   Report Status 06/16/2015 FINAL  Final    RADIOLOGY:  No results found.  EKG:   Orders placed or performed during the hospital encounter of 09/17/16  . ED EKG within 10 minutes  . ED EKG within 10 minutes  . EKG 12-Lead  . EKG 12-Lead      Management plans discussed with the patient, family and they are in agreement.  CODE STATUS:     Code Status Orders        Start     Ordered   09/20/16 1210  Do not attempt resuscitation (DNR)  Continuous  Question Answer Comment  In the event of cardiac or respiratory  ARREST Do not call a "code blue"   In the event of cardiac or respiratory ARREST Do not perform Intubation, CPR, defibrillation or ACLS   In the event of cardiac or respiratory ARREST Use medication by any route, position, wound care, and other measures to relive pain and suffering. May use oxygen, suction and manual treatment of airway obstruction as needed for comfort.      09/20/16 1209    Code Status History    Date Active Date Inactive Code Status Order ID Comments User Context   09/17/2016 10:20 PM 09/20/2016 12:09 PM Full Code [id number]  Hugelmeyer, Ubaldo Glassing, DO ED   08/15/2016  7:35 AM 08/16/2016  2:40 PM Full Code [id number]  Harrie Foreman, MD Inpatient   06/06/2012  3:31 PM 06/08/2012  1:29 PM Full Code [id number]  Nani Skillern, PA Inpatient    Advance Directive Documentation     Most Recent Value  Type of Advance Directive  Healthcare Power of Attorney, Living will  Pre-existing out of facility DNR order (yellow form or pink MOST form)  -  "MOST" Form in Place?  -      TOTAL TIME TAKING CARE OF THIS PATIENT: 40 minutes.    Theodoro Grist M.D on 09/20/2016 at 2:27 PM  Between 7am to 6pm - Pager - [phone]  After 6pm go to [email or web address] - password [email or web address] [facility]  Office  [phone]  CC: Primary care physician; Rusty Aus, MD

## 2016-09-20 NOTE — Clinical Social Work Note (Addendum)
CSW spoke to patient and family in regards to ALF placement in the future.  Patient and family are planning to take patient home with home health, and look at different ALFs.  Case manager aware, weekend social worker provided list of different ALFs for patient.  CSW to sign off, please reconsult if other social work needs arise.  Jones Broom. [place], MSW, [place]  09/20/2016 12:50 PM

## 2016-09-20 NOTE — Care Management Important Message (Signed)
Important Message  Patient Details  Name: [name] MRN: [id number] Date of Birth: [birth year]   Medicare Important Message Given:  Yes Signed IM notice given    Katrina Stack, RN 09/20/2016, 1:37 PM

## 2016-09-20 NOTE — Consult Note (Signed)
Consultation Note Date: 09/20/2016   Patient Name: [name]  DOB: [birth year]  MRN: [id number]  Age / Sex: 83 y.o., female  PCP: Hannah Aus, MD Referring Physician: Theodoro Grist, MD  Reason for Consultation: Establishing goals of care and Psychosocial/spiritual support  HPI/Patient Profile: 83 y.o. female  admitted on 09/17/2016 with known history of  CAD, systolic CHF EF 95-28%, severe AS, GERD, HTN, HLD, OA, osteopenia  admitted thru the ED   for evaluation of shortness of breath.    She was hospitalized two weeks ago for similar symptoms. She is on home O2  Patient has had rapid physical and functional decline over the past 3 months.  Patient is unable to stay at home independently at his time.  Patient and her family face advanced directive decisions and anticipatory care needs.  Clinical Assessment and Goals of Care:   This NP [name] reviewed medical records, received report from team, assessed the patient and then meet at the patient's bedside along with her son/ [name]/ main support person to discuss diagnosis, prognosis, GOC, EOL wishes disposition and options.  A detailed discussion was had today regarding advanced directives.  Concepts specific to code status, artifical feeding and hydration, continued IV antibiotics and rehospitalization was had.  The difference between a aggressive medical intervention path  and a palliative comfort care path for this patient at this time was had.  Values and goals of care important to patient and family were attempted to be elicited.  MOST form introduced  Concept of Hospice and Palliative Care were discussed  Natural trajectory and expectations at EOL were discussed.  Questions and concerns addressed.   Family encouraged to call with questions or concerns.  PMT will continue to support holistically.     SUMMARY OF RECOMMENDATIONS     Code Status/Advance Care Planning:  DNR-documented today   Palliative Prophylaxis:   Aspiration, Bowel Regimen and Delirium Protocol  Additional Recommendations (Limitations, Scope, Preferences):  Full Scope Treatment  Psycho-social/Spiritual:   Desire for further Chaplaincy support:no  Additional Recommendations: Education on Hospice-discussed self referral process.  I believe she is hospice eligible at this time under her severe AS diagnosis.  Family will make  decision to trigger hospice once discahrged  Prognosis:   < 6 months  Discharge Planning:  Patient is open to short term rehab if eligible, otherwise plan is to go home with [facility] and Palliative Home services along with increased hours with in  home care workers.    To Be Determined       Primary Diagnoses: Present on Admission: . Acute systolic heart failure due to valvular disease ([place])   I have reviewed the medical record, interviewed the patient and family, and examined the patient. The following aspects are pertinent.  Past Medical History:  Diagnosis Date  . Allergic rhinitis   . Anemia   . Anginal pain ([place])   . Coronary artery disease   . GERD (gastroesophageal reflux disease)   . Heart murmur   .  Hyperlipidemia   . Hypertension   . Osteoarthritis of hand   . Osteopenia   . Recurrent pleural effusion on left   . Shortness of breath    "since the heart OR in 05/2012 cause of fluid" (08/17/2012)   Social History   Social History  . Marital status: Widowed    Spouse name: [name]  . Number of children: [name]  . Years of education: [name]   Social History Main Topics  . Smoking status: Never Smoker  . Smokeless tobacco: Never Used  . Alcohol use No  . Drug use: No  . Sexual activity: Not Currently   Other Topics Concern  . None   Social History Narrative  . None   Family History  Problem Relation Age of Onset  . Heart disease Mother   . Hypertension Mother   . Cancer Father 4        liver  . Alzheimer's disease Sister   . Hypertension Brother   . Sudden death Brother   . Hypertension Brother 25  . Heart disease Brother   . Hypertension Brother   . Alzheimer's disease Sister    Scheduled Meds: . ALPRAZolam  0.25 mg Oral QHS  . furosemide  40 mg Intravenous Daily  . mouth rinse  15 mL Mouth Rinse BID  . potassium chloride  20 mEq Oral Daily  . sodium chloride flush  3 mL Intravenous Q12H   Continuous Infusions: . sodium chloride     PRN Meds:.sodium chloride, acetaminophen, ondansetron (ZOFRAN) IV, sodium chloride flush Medications Prior to Admission:  Prior to Admission medications   Medication Sig Start Date End Date Taking? Authorizing Provider  acetaminophen (TYLENOL) 500 MG tablet Take 500 mg by mouth 2 (two) times daily. 06/13/12  Yes Barrett, Erin R, PA-C  ALPRAZolam (XANAX) 0.25 MG tablet Take 0.25 mg by mouth at bedtime.    Yes [provider]  apixaban (ELIQUIS) 2.5 MG TABS tablet Take 1 tablet by mouth daily.   Yes [provider]  calcium carbonate (OSCAL) 1500 (600 Ca) MG TABS tablet Take 600 mg of elemental calcium by mouth 2 (two) times daily with a meal.   Yes [provider]  cycloSPORINE (RESTASIS) 0.05 % ophthalmic emulsion Place 1 drop into both eyes 2 (two) times daily.   Yes [provider]  diltiazem (TIAZAC) 180 MG 24 hr capsule Take 1 capsule by mouth daily. 05/05/16  Yes [provider]  ferrous sulfate 325 (65 FE) MG tablet Take 325 mg by mouth daily at 12 noon.   Yes [provider]  furosemide (LASIX) 20 MG tablet Take 1 tablet (20 mg total) by mouth daily. 08/16/16  Yes Gouru, Illene Silver, MD  hydroxychloroquine (PLAQUENIL) 200 MG tablet Take 1 tablet by mouth daily. 09/06/16  Yes [provider]  hydroxypropyl methylcellulose (ISOPTO TEARS) 2.5 % ophthalmic solution Place 1 drop into both eyes 2 (two) times daily as needed (dry eyes).   Yes [provider]  levothyroxine  (SYNTHROID, LEVOTHROID) 50 MCG tablet Take 50 mcg by mouth daily before breakfast.  07/24/12  Yes [provider]  magnesium hydroxide (MILK OF MAGNESIA) 400 MG/5ML suspension Take 10 mLs by mouth daily as needed for constipation.   Yes [provider]  meclizine (ANTIVERT) 25 MG tablet Take 25 mg by mouth 3 (three) times daily as needed for dizziness.   Yes [provider]  metoprolol succinate (TOPROL-XL) 25 MG 24 hr tablet Take 25 mg by mouth daily.  Yes [provider]  omeprazole (PRILOSEC) 20 MG capsule Take 1 capsule by mouth as needed.   Yes [provider]  polyethylene glycol powder (GLYCOLAX/MIRALAX) powder  09/15/12  Yes [provider]  potassium chloride SA (K-DUR,KLOR-CON) 10 MEQ tablet Take 1 tablet (10 mEq total) by mouth daily. 08/16/16  Yes Gouru, Illene Silver, MD  pravastatin (PRAVACHOL) 40 MG tablet Take 40 mg by mouth at bedtime.    Yes [provider]  vitamin B-12 (CYANOCOBALAMIN) 1000 MCG tablet Take 1,000 mcg by mouth daily.   Yes [provider]  furosemide (LASIX) 40 MG tablet Take 1 tablet (40 mg total) by mouth daily. Can have one additional pill a day as needed, thank you 09/19/16 09/19/17  Hannah Grist, MD  magnesium oxide (MAG-OX) 400 (241.3 Mg) MG tablet Take 1 tablet (400 mg total) by mouth daily. 09/19/16   Hannah Grist, MD  potassium chloride SA (K-DUR,KLOR-CON) 20 MEQ tablet Take 1 tablet (20 mEq total) by mouth daily. 09/20/16   Hannah Grist, MD   Allergies  Allergen Reactions  . Alendronate Sodium Other (See Comments)    REACTION: GI upset  . Simvastatin Other (See Comments)    Feet feel heavy   Review of Systems  Constitutional: Positive for fatigue.  Respiratory: Positive for shortness of breath.   Neurological: Positive for weakness.    Physical Exam  Constitutional: She is oriented to person, place, and time. She appears well-developed.  -frail , elderly female   Cardiovascular: An  irregular rhythm present.  Pulmonary/Chest: She has decreased breath sounds in the right lower field and the left lower field.  Abdominal: Soft. Normal appearance and bowel sounds are normal.  Neurological: She is alert and oriented to person, place, and time.  Skin: Skin is warm and dry.    Vital Signs: BP (!) 130/58 (BP Location: Right Arm)   Pulse 90   Temp 98.3 F (36.8 C) (Oral)   Resp 16   Ht 5\' 3"  (1.6 m)   Wt 43.2 kg (95 lb 4.8 oz)   SpO2 (!) 87% Comment: with exertion  BMI 16.88 kg/m  Pain Assessment: No/denies pain   Pain Score: Asleep   SpO2: SpO2: (!) 87 % (with exertion) O2 Device:SpO2: (!) 87 % (with exertion) O2 Flow Rate: .O2 Flow Rate (L/min): 2 L/min  IO: Intake/output summary:  Intake/Output Summary (Last 24 hours) at 09/20/16 1209 Last data filed at 09/20/16 0944  Gross per 24 hour  Intake              480 ml  Output              600 ml  Net             -120 ml    LBM: Last BM Date: 09/17/16 Baseline Weight: Weight: 43.1 kg (95 lb) Most recent weight: Weight: 43.2 kg (95 lb 4.8 oz)      Palliative Assessment/Data: 50 %   Flowsheet Rows     Most Recent Value  Intake Tab  Referral Department  Hospitalist  Unit at Time of Referral  Cardiac/Telemetry Unit  Palliative Care Primary Diagnosis  Cardiac  Date Notified  09/18/16  Palliative Care Type  New Palliative care  Reason for referral  Clarify Goals of Care  Date of Admission  09/17/16  # of days IP prior to Palliative referral  1  Clinical Assessment  Psychosocial & Spiritual Assessment  Palliative Care Outcomes    Discussed with Dr Eugenia Mcalpine  Time In: 1045 Time Out: 1200 Time Total: 75 min Greater than 50%  of this time was spent counseling and coordinating care related to the above assessment and plan.  Signed by: Hannah Lessen, NP   Please contact Palliative Medicine Team phone at [phone] for questions and concerns.  For individual provider: See Shea Evans

## 2016-09-20 NOTE — Progress Notes (Signed)
New referral for Home Palliative services received from Palliative Medicine NP and CSW Lance Sell. Patient information faxed to referral. Plan is for discharge home today. Thank you. Flo Shanks RN, BSN, [facility] [facility] [place], [facility] [phone] c

## 2016-09-20 NOTE — Discharge Instructions (Signed)
Heart Failure Clinic appointment on October 04, 2016 at 11:40am with Darylene Price, [place]. Please call [phone] to reschedule.

## 2016-09-22 DIAGNOSIS — Z515 Encounter for palliative care: Secondary | ICD-10-CM

## 2016-09-22 DIAGNOSIS — Z66 Do not resuscitate: Secondary | ICD-10-CM

## 2016-09-23 DIAGNOSIS — I129 Hypertensive chronic kidney disease with stage 1 through stage 4 chronic kidney disease, or unspecified chronic kidney disease: Secondary | ICD-10-CM | POA: Diagnosis not present

## 2016-09-23 DIAGNOSIS — I5023 Acute on chronic systolic (congestive) heart failure: Secondary | ICD-10-CM | POA: Diagnosis not present

## 2016-09-23 DIAGNOSIS — I2510 Atherosclerotic heart disease of native coronary artery without angina pectoris: Secondary | ICD-10-CM | POA: Diagnosis not present

## 2016-09-23 DIAGNOSIS — K219 Gastro-esophageal reflux disease without esophagitis: Secondary | ICD-10-CM | POA: Diagnosis not present

## 2016-09-23 DIAGNOSIS — I350 Nonrheumatic aortic (valve) stenosis: Secondary | ICD-10-CM | POA: Diagnosis not present

## 2016-09-23 DIAGNOSIS — N184 Chronic kidney disease, stage 4 (severe): Secondary | ICD-10-CM | POA: Diagnosis not present

## 2016-09-23 DIAGNOSIS — D631 Anemia in chronic kidney disease: Secondary | ICD-10-CM | POA: Diagnosis not present

## 2016-09-23 DIAGNOSIS — I429 Cardiomyopathy, unspecified: Secondary | ICD-10-CM | POA: Diagnosis not present

## 2016-09-23 DIAGNOSIS — I480 Paroxysmal atrial fibrillation: Secondary | ICD-10-CM | POA: Diagnosis not present

## 2016-09-24 DIAGNOSIS — J9611 Chronic respiratory failure with hypoxia: Secondary | ICD-10-CM | POA: Diagnosis not present

## 2016-09-24 DIAGNOSIS — I5033 Acute on chronic diastolic (congestive) heart failure: Secondary | ICD-10-CM | POA: Diagnosis not present

## 2016-09-27 DIAGNOSIS — I480 Paroxysmal atrial fibrillation: Secondary | ICD-10-CM | POA: Diagnosis not present

## 2016-09-27 DIAGNOSIS — I5023 Acute on chronic systolic (congestive) heart failure: Secondary | ICD-10-CM | POA: Diagnosis not present

## 2016-09-27 DIAGNOSIS — I2510 Atherosclerotic heart disease of native coronary artery without angina pectoris: Secondary | ICD-10-CM | POA: Diagnosis not present

## 2016-09-27 DIAGNOSIS — K219 Gastro-esophageal reflux disease without esophagitis: Secondary | ICD-10-CM | POA: Diagnosis not present

## 2016-09-27 DIAGNOSIS — I429 Cardiomyopathy, unspecified: Secondary | ICD-10-CM | POA: Diagnosis not present

## 2016-09-27 DIAGNOSIS — I129 Hypertensive chronic kidney disease with stage 1 through stage 4 chronic kidney disease, or unspecified chronic kidney disease: Secondary | ICD-10-CM | POA: Diagnosis not present

## 2016-09-27 DIAGNOSIS — N184 Chronic kidney disease, stage 4 (severe): Secondary | ICD-10-CM | POA: Diagnosis not present

## 2016-09-27 DIAGNOSIS — I350 Nonrheumatic aortic (valve) stenosis: Secondary | ICD-10-CM | POA: Diagnosis not present

## 2016-09-27 DIAGNOSIS — D631 Anemia in chronic kidney disease: Secondary | ICD-10-CM | POA: Diagnosis not present

## 2016-09-28 DIAGNOSIS — I350 Nonrheumatic aortic (valve) stenosis: Secondary | ICD-10-CM | POA: Diagnosis not present

## 2016-09-28 DIAGNOSIS — K219 Gastro-esophageal reflux disease without esophagitis: Secondary | ICD-10-CM | POA: Diagnosis not present

## 2016-09-28 DIAGNOSIS — N184 Chronic kidney disease, stage 4 (severe): Secondary | ICD-10-CM | POA: Diagnosis not present

## 2016-09-28 DIAGNOSIS — I429 Cardiomyopathy, unspecified: Secondary | ICD-10-CM | POA: Diagnosis not present

## 2016-09-28 DIAGNOSIS — I129 Hypertensive chronic kidney disease with stage 1 through stage 4 chronic kidney disease, or unspecified chronic kidney disease: Secondary | ICD-10-CM | POA: Diagnosis not present

## 2016-09-28 DIAGNOSIS — D631 Anemia in chronic kidney disease: Secondary | ICD-10-CM | POA: Diagnosis not present

## 2016-09-28 DIAGNOSIS — I5023 Acute on chronic systolic (congestive) heart failure: Secondary | ICD-10-CM | POA: Diagnosis not present

## 2016-09-28 DIAGNOSIS — I480 Paroxysmal atrial fibrillation: Secondary | ICD-10-CM | POA: Diagnosis not present

## 2016-09-28 DIAGNOSIS — I2510 Atherosclerotic heart disease of native coronary artery without angina pectoris: Secondary | ICD-10-CM | POA: Diagnosis not present

## 2016-09-29 DIAGNOSIS — I129 Hypertensive chronic kidney disease with stage 1 through stage 4 chronic kidney disease, or unspecified chronic kidney disease: Secondary | ICD-10-CM | POA: Diagnosis not present

## 2016-09-29 DIAGNOSIS — I5023 Acute on chronic systolic (congestive) heart failure: Secondary | ICD-10-CM | POA: Diagnosis not present

## 2016-09-29 DIAGNOSIS — I480 Paroxysmal atrial fibrillation: Secondary | ICD-10-CM | POA: Diagnosis not present

## 2016-09-29 DIAGNOSIS — N184 Chronic kidney disease, stage 4 (severe): Secondary | ICD-10-CM | POA: Diagnosis not present

## 2016-09-29 DIAGNOSIS — I350 Nonrheumatic aortic (valve) stenosis: Secondary | ICD-10-CM | POA: Diagnosis not present

## 2016-09-29 DIAGNOSIS — K219 Gastro-esophageal reflux disease without esophagitis: Secondary | ICD-10-CM | POA: Diagnosis not present

## 2016-09-29 DIAGNOSIS — D631 Anemia in chronic kidney disease: Secondary | ICD-10-CM | POA: Diagnosis not present

## 2016-09-29 DIAGNOSIS — I2510 Atherosclerotic heart disease of native coronary artery without angina pectoris: Secondary | ICD-10-CM | POA: Diagnosis not present

## 2016-09-29 DIAGNOSIS — I429 Cardiomyopathy, unspecified: Secondary | ICD-10-CM | POA: Diagnosis not present

## 2016-09-30 DIAGNOSIS — I2510 Atherosclerotic heart disease of native coronary artery without angina pectoris: Secondary | ICD-10-CM | POA: Diagnosis not present

## 2016-09-30 DIAGNOSIS — I350 Nonrheumatic aortic (valve) stenosis: Secondary | ICD-10-CM | POA: Diagnosis not present

## 2016-09-30 DIAGNOSIS — I480 Paroxysmal atrial fibrillation: Secondary | ICD-10-CM | POA: Diagnosis not present

## 2016-09-30 DIAGNOSIS — I5023 Acute on chronic systolic (congestive) heart failure: Secondary | ICD-10-CM | POA: Diagnosis not present

## 2016-09-30 DIAGNOSIS — I429 Cardiomyopathy, unspecified: Secondary | ICD-10-CM | POA: Diagnosis not present

## 2016-09-30 DIAGNOSIS — N184 Chronic kidney disease, stage 4 (severe): Secondary | ICD-10-CM | POA: Diagnosis not present

## 2016-09-30 DIAGNOSIS — I129 Hypertensive chronic kidney disease with stage 1 through stage 4 chronic kidney disease, or unspecified chronic kidney disease: Secondary | ICD-10-CM | POA: Diagnosis not present

## 2016-09-30 DIAGNOSIS — D631 Anemia in chronic kidney disease: Secondary | ICD-10-CM | POA: Diagnosis not present

## 2016-09-30 DIAGNOSIS — K219 Gastro-esophageal reflux disease without esophagitis: Secondary | ICD-10-CM | POA: Diagnosis not present

## 2016-10-01 DIAGNOSIS — I350 Nonrheumatic aortic (valve) stenosis: Secondary | ICD-10-CM | POA: Diagnosis not present

## 2016-10-01 DIAGNOSIS — I129 Hypertensive chronic kidney disease with stage 1 through stage 4 chronic kidney disease, or unspecified chronic kidney disease: Secondary | ICD-10-CM | POA: Diagnosis not present

## 2016-10-01 DIAGNOSIS — D631 Anemia in chronic kidney disease: Secondary | ICD-10-CM | POA: Diagnosis not present

## 2016-10-01 DIAGNOSIS — I2510 Atherosclerotic heart disease of native coronary artery without angina pectoris: Secondary | ICD-10-CM | POA: Diagnosis not present

## 2016-10-01 DIAGNOSIS — I480 Paroxysmal atrial fibrillation: Secondary | ICD-10-CM | POA: Diagnosis not present

## 2016-10-01 DIAGNOSIS — I5023 Acute on chronic systolic (congestive) heart failure: Secondary | ICD-10-CM | POA: Diagnosis not present

## 2016-10-01 DIAGNOSIS — I429 Cardiomyopathy, unspecified: Secondary | ICD-10-CM | POA: Diagnosis not present

## 2016-10-01 DIAGNOSIS — K219 Gastro-esophageal reflux disease without esophagitis: Secondary | ICD-10-CM | POA: Diagnosis not present

## 2016-10-01 DIAGNOSIS — N184 Chronic kidney disease, stage 4 (severe): Secondary | ICD-10-CM | POA: Diagnosis not present

## 2016-10-04 ENCOUNTER — Telehealth: Payer: Self-pay | Admitting: Family

## 2016-10-04 ENCOUNTER — Ambulatory Visit: Payer: Medicare HMO | Admitting: Family

## 2016-10-04 DIAGNOSIS — N184 Chronic kidney disease, stage 4 (severe): Secondary | ICD-10-CM | POA: Diagnosis not present

## 2016-10-04 DIAGNOSIS — I350 Nonrheumatic aortic (valve) stenosis: Secondary | ICD-10-CM | POA: Diagnosis not present

## 2016-10-04 DIAGNOSIS — I129 Hypertensive chronic kidney disease with stage 1 through stage 4 chronic kidney disease, or unspecified chronic kidney disease: Secondary | ICD-10-CM | POA: Diagnosis not present

## 2016-10-04 DIAGNOSIS — I429 Cardiomyopathy, unspecified: Secondary | ICD-10-CM | POA: Diagnosis not present

## 2016-10-04 DIAGNOSIS — K219 Gastro-esophageal reflux disease without esophagitis: Secondary | ICD-10-CM | POA: Diagnosis not present

## 2016-10-04 DIAGNOSIS — I5023 Acute on chronic systolic (congestive) heart failure: Secondary | ICD-10-CM | POA: Diagnosis not present

## 2016-10-04 DIAGNOSIS — I2510 Atherosclerotic heart disease of native coronary artery without angina pectoris: Secondary | ICD-10-CM | POA: Diagnosis not present

## 2016-10-04 DIAGNOSIS — I480 Paroxysmal atrial fibrillation: Secondary | ICD-10-CM | POA: Diagnosis not present

## 2016-10-04 DIAGNOSIS — D631 Anemia in chronic kidney disease: Secondary | ICD-10-CM | POA: Diagnosis not present

## 2016-10-04 NOTE — Telephone Encounter (Signed)
Patient missed her initial appointment at the [facility] on 10/04/16. Will attempt to reschedule.

## 2016-10-05 DIAGNOSIS — Z8679 Personal history of other diseases of the circulatory system: Secondary | ICD-10-CM | POA: Diagnosis not present

## 2016-10-05 DIAGNOSIS — Z951 Presence of aortocoronary bypass graft: Secondary | ICD-10-CM | POA: Diagnosis not present

## 2016-10-05 DIAGNOSIS — I480 Paroxysmal atrial fibrillation: Secondary | ICD-10-CM | POA: Diagnosis not present

## 2016-10-05 DIAGNOSIS — N184 Chronic kidney disease, stage 4 (severe): Secondary | ICD-10-CM | POA: Diagnosis not present

## 2016-10-05 DIAGNOSIS — I5033 Acute on chronic diastolic (congestive) heart failure: Secondary | ICD-10-CM | POA: Diagnosis not present

## 2016-10-05 DIAGNOSIS — I2510 Atherosclerotic heart disease of native coronary artery without angina pectoris: Secondary | ICD-10-CM | POA: Diagnosis not present

## 2016-10-05 DIAGNOSIS — I350 Nonrheumatic aortic (valve) stenosis: Secondary | ICD-10-CM | POA: Diagnosis not present

## 2016-10-05 DIAGNOSIS — Z950 Presence of cardiac pacemaker: Secondary | ICD-10-CM | POA: Diagnosis not present

## 2016-10-06 DIAGNOSIS — N184 Chronic kidney disease, stage 4 (severe): Secondary | ICD-10-CM | POA: Diagnosis not present

## 2016-10-06 DIAGNOSIS — I129 Hypertensive chronic kidney disease with stage 1 through stage 4 chronic kidney disease, or unspecified chronic kidney disease: Secondary | ICD-10-CM | POA: Diagnosis not present

## 2016-10-06 DIAGNOSIS — I5023 Acute on chronic systolic (congestive) heart failure: Secondary | ICD-10-CM | POA: Diagnosis not present

## 2016-10-06 DIAGNOSIS — I429 Cardiomyopathy, unspecified: Secondary | ICD-10-CM | POA: Diagnosis not present

## 2016-10-06 DIAGNOSIS — I2510 Atherosclerotic heart disease of native coronary artery without angina pectoris: Secondary | ICD-10-CM | POA: Diagnosis not present

## 2016-10-06 DIAGNOSIS — K219 Gastro-esophageal reflux disease without esophagitis: Secondary | ICD-10-CM | POA: Diagnosis not present

## 2016-10-06 DIAGNOSIS — I480 Paroxysmal atrial fibrillation: Secondary | ICD-10-CM | POA: Diagnosis not present

## 2016-10-06 DIAGNOSIS — I350 Nonrheumatic aortic (valve) stenosis: Secondary | ICD-10-CM | POA: Diagnosis not present

## 2016-10-06 DIAGNOSIS — D631 Anemia in chronic kidney disease: Secondary | ICD-10-CM | POA: Diagnosis not present

## 2016-10-07 DIAGNOSIS — I480 Paroxysmal atrial fibrillation: Secondary | ICD-10-CM | POA: Diagnosis not present

## 2016-10-07 DIAGNOSIS — I350 Nonrheumatic aortic (valve) stenosis: Secondary | ICD-10-CM | POA: Diagnosis not present

## 2016-10-07 DIAGNOSIS — N184 Chronic kidney disease, stage 4 (severe): Secondary | ICD-10-CM | POA: Diagnosis not present

## 2016-10-07 DIAGNOSIS — I2510 Atherosclerotic heart disease of native coronary artery without angina pectoris: Secondary | ICD-10-CM | POA: Diagnosis not present

## 2016-10-07 DIAGNOSIS — K219 Gastro-esophageal reflux disease without esophagitis: Secondary | ICD-10-CM | POA: Diagnosis not present

## 2016-10-07 DIAGNOSIS — I5023 Acute on chronic systolic (congestive) heart failure: Secondary | ICD-10-CM | POA: Diagnosis not present

## 2016-10-07 DIAGNOSIS — I429 Cardiomyopathy, unspecified: Secondary | ICD-10-CM | POA: Diagnosis not present

## 2016-10-07 DIAGNOSIS — D631 Anemia in chronic kidney disease: Secondary | ICD-10-CM | POA: Diagnosis not present

## 2016-10-07 DIAGNOSIS — I129 Hypertensive chronic kidney disease with stage 1 through stage 4 chronic kidney disease, or unspecified chronic kidney disease: Secondary | ICD-10-CM | POA: Diagnosis not present

## 2016-10-08 DIAGNOSIS — I480 Paroxysmal atrial fibrillation: Secondary | ICD-10-CM | POA: Diagnosis not present

## 2016-10-08 DIAGNOSIS — N184 Chronic kidney disease, stage 4 (severe): Secondary | ICD-10-CM | POA: Diagnosis not present

## 2016-10-08 DIAGNOSIS — I350 Nonrheumatic aortic (valve) stenosis: Secondary | ICD-10-CM | POA: Diagnosis not present

## 2016-10-08 DIAGNOSIS — I2510 Atherosclerotic heart disease of native coronary artery without angina pectoris: Secondary | ICD-10-CM | POA: Diagnosis not present

## 2016-10-08 DIAGNOSIS — I129 Hypertensive chronic kidney disease with stage 1 through stage 4 chronic kidney disease, or unspecified chronic kidney disease: Secondary | ICD-10-CM | POA: Diagnosis not present

## 2016-10-08 DIAGNOSIS — D631 Anemia in chronic kidney disease: Secondary | ICD-10-CM | POA: Diagnosis not present

## 2016-10-08 DIAGNOSIS — I429 Cardiomyopathy, unspecified: Secondary | ICD-10-CM | POA: Diagnosis not present

## 2016-10-08 DIAGNOSIS — K219 Gastro-esophageal reflux disease without esophagitis: Secondary | ICD-10-CM | POA: Diagnosis not present

## 2016-10-08 DIAGNOSIS — I5023 Acute on chronic systolic (congestive) heart failure: Secondary | ICD-10-CM | POA: Diagnosis not present

## 2016-10-11 DIAGNOSIS — D631 Anemia in chronic kidney disease: Secondary | ICD-10-CM | POA: Diagnosis not present

## 2016-10-11 DIAGNOSIS — N184 Chronic kidney disease, stage 4 (severe): Secondary | ICD-10-CM | POA: Diagnosis not present

## 2016-10-11 DIAGNOSIS — I350 Nonrheumatic aortic (valve) stenosis: Secondary | ICD-10-CM | POA: Diagnosis not present

## 2016-10-11 DIAGNOSIS — K219 Gastro-esophageal reflux disease without esophagitis: Secondary | ICD-10-CM | POA: Diagnosis not present

## 2016-10-11 DIAGNOSIS — I2510 Atherosclerotic heart disease of native coronary artery without angina pectoris: Secondary | ICD-10-CM | POA: Diagnosis not present

## 2016-10-11 DIAGNOSIS — I429 Cardiomyopathy, unspecified: Secondary | ICD-10-CM | POA: Diagnosis not present

## 2016-10-11 DIAGNOSIS — I129 Hypertensive chronic kidney disease with stage 1 through stage 4 chronic kidney disease, or unspecified chronic kidney disease: Secondary | ICD-10-CM | POA: Diagnosis not present

## 2016-10-11 DIAGNOSIS — I480 Paroxysmal atrial fibrillation: Secondary | ICD-10-CM | POA: Diagnosis not present

## 2016-10-11 DIAGNOSIS — I5023 Acute on chronic systolic (congestive) heart failure: Secondary | ICD-10-CM | POA: Diagnosis not present

## 2016-10-14 ENCOUNTER — Emergency Department: Payer: Medicare HMO

## 2016-10-14 ENCOUNTER — Inpatient Hospital Stay
Admission: EM | Admit: 2016-10-14 | Discharge: 2016-10-20 | DRG: 291 | Disposition: A | Discharge status: Hospice | Payer: Medicare HMO | Attending: Internal Medicine | Admitting: Internal Medicine

## 2016-10-14 DIAGNOSIS — I5021 Acute systolic (congestive) heart failure: Secondary | ICD-10-CM

## 2016-10-14 DIAGNOSIS — I5040 Unspecified combined systolic (congestive) and diastolic (congestive) heart failure: Secondary | ICD-10-CM | POA: Diagnosis not present

## 2016-10-14 DIAGNOSIS — T502X5A Adverse effect of carbonic-anhydrase inhibitors, benzothiadiazides and other diuretics, initial encounter: Secondary | ICD-10-CM | POA: Diagnosis present

## 2016-10-14 DIAGNOSIS — I352 Nonrheumatic aortic (valve) stenosis with insufficiency: Secondary | ICD-10-CM | POA: Diagnosis present

## 2016-10-14 DIAGNOSIS — I38 Endocarditis, valve unspecified: Secondary | ICD-10-CM | POA: Diagnosis not present

## 2016-10-14 DIAGNOSIS — G4700 Insomnia, unspecified: Secondary | ICD-10-CM | POA: Diagnosis present

## 2016-10-14 DIAGNOSIS — K219 Gastro-esophageal reflux disease without esophagitis: Secondary | ICD-10-CM | POA: Diagnosis present

## 2016-10-14 DIAGNOSIS — Z515 Encounter for palliative care: Secondary | ICD-10-CM | POA: Diagnosis not present

## 2016-10-14 DIAGNOSIS — Z7189 Other specified counseling: Secondary | ICD-10-CM

## 2016-10-14 DIAGNOSIS — I130 Hypertensive heart and chronic kidney disease with heart failure and stage 1 through stage 4 chronic kidney disease, or unspecified chronic kidney disease: Principal | ICD-10-CM | POA: Diagnosis present

## 2016-10-14 DIAGNOSIS — I110 Hypertensive heart disease with heart failure: Secondary | ICD-10-CM | POA: Diagnosis not present

## 2016-10-14 DIAGNOSIS — Z7901 Long term (current) use of anticoagulants: Secondary | ICD-10-CM | POA: Diagnosis not present

## 2016-10-14 DIAGNOSIS — M069 Rheumatoid arthritis, unspecified: Secondary | ICD-10-CM | POA: Diagnosis present

## 2016-10-14 DIAGNOSIS — J9621 Acute and chronic respiratory failure with hypoxia: Secondary | ICD-10-CM | POA: Diagnosis present

## 2016-10-14 DIAGNOSIS — J9611 Chronic respiratory failure with hypoxia: Secondary | ICD-10-CM | POA: Diagnosis not present

## 2016-10-14 DIAGNOSIS — I429 Cardiomyopathy, unspecified: Secondary | ICD-10-CM | POA: Diagnosis present

## 2016-10-14 DIAGNOSIS — Z79899 Other long term (current) drug therapy: Secondary | ICD-10-CM

## 2016-10-14 DIAGNOSIS — R0602 Shortness of breath: Secondary | ICD-10-CM | POA: Diagnosis present

## 2016-10-14 DIAGNOSIS — Z9981 Dependence on supplemental oxygen: Secondary | ICD-10-CM | POA: Diagnosis not present

## 2016-10-14 DIAGNOSIS — Z8249 Family history of ischemic heart disease and other diseases of the circulatory system: Secondary | ICD-10-CM | POA: Diagnosis not present

## 2016-10-14 DIAGNOSIS — I480 Paroxysmal atrial fibrillation: Secondary | ICD-10-CM | POA: Diagnosis present

## 2016-10-14 DIAGNOSIS — E875 Hyperkalemia: Secondary | ICD-10-CM | POA: Diagnosis present

## 2016-10-14 DIAGNOSIS — F410 Panic disorder [episodic paroxysmal anxiety] without agoraphobia: Secondary | ICD-10-CM | POA: Diagnosis present

## 2016-10-14 DIAGNOSIS — Z951 Presence of aortocoronary bypass graft: Secondary | ICD-10-CM | POA: Diagnosis not present

## 2016-10-14 DIAGNOSIS — E039 Hypothyroidism, unspecified: Secondary | ICD-10-CM | POA: Diagnosis present

## 2016-10-14 DIAGNOSIS — Z9861 Coronary angioplasty status: Secondary | ICD-10-CM | POA: Diagnosis not present

## 2016-10-14 DIAGNOSIS — I2510 Atherosclerotic heart disease of native coronary artery without angina pectoris: Secondary | ICD-10-CM | POA: Diagnosis present

## 2016-10-14 DIAGNOSIS — I5023 Acute on chronic systolic (congestive) heart failure: Secondary | ICD-10-CM | POA: Diagnosis present

## 2016-10-14 DIAGNOSIS — N179 Acute kidney failure, unspecified: Secondary | ICD-10-CM | POA: Diagnosis present

## 2016-10-14 DIAGNOSIS — I509 Heart failure, unspecified: Secondary | ICD-10-CM

## 2016-10-14 DIAGNOSIS — E785 Hyperlipidemia, unspecified: Secondary | ICD-10-CM | POA: Diagnosis present

## 2016-10-14 DIAGNOSIS — N184 Chronic kidney disease, stage 4 (severe): Secondary | ICD-10-CM | POA: Diagnosis present

## 2016-10-14 DIAGNOSIS — Z66 Do not resuscitate: Secondary | ICD-10-CM | POA: Diagnosis present

## 2016-10-14 DIAGNOSIS — J810 Acute pulmonary edema: Secondary | ICD-10-CM | POA: Diagnosis not present

## 2016-10-14 DIAGNOSIS — I350 Nonrheumatic aortic (valve) stenosis: Secondary | ICD-10-CM | POA: Diagnosis not present

## 2016-10-14 DIAGNOSIS — I5022 Chronic systolic (congestive) heart failure: Secondary | ICD-10-CM | POA: Diagnosis not present

## 2016-10-14 DIAGNOSIS — I10 Essential (primary) hypertension: Secondary | ICD-10-CM | POA: Diagnosis not present

## 2016-10-14 LAB — COMPREHENSIVE METABOLIC PANEL
ALBUMIN: 3.3 g/dL — AB (ref 3.5–5.0)
ALK PHOS: 65 U/L (ref 38–126)
ALT: 10 U/L — ABNORMAL LOW (ref 14–54)
ANION GAP: 9 (ref 5–15)
AST: 23 U/L (ref 15–41)
BUN: 21 mg/dL — AB (ref 6–20)
CO2: 23 mmol/L (ref 22–32)
Calcium: 8.6 mg/dL — ABNORMAL LOW (ref 8.9–10.3)
Chloride: 99 mmol/L — ABNORMAL LOW (ref 101–111)
Creatinine, Ser: 1.75 mg/dL — ABNORMAL HIGH (ref 0.44–1.00)
GFR calc Af Amer: 28 mL/min — ABNORMAL LOW (ref >60)
GFR calc non Af Amer: 24 mL/min — ABNORMAL LOW (ref >60)
GLUCOSE: 108 mg/dL — AB (ref 65–99)
POTASSIUM: 4.4 mmol/L (ref 3.5–5.1)
SODIUM: 131 mmol/L — AB (ref 135–145)
Total Bilirubin: 0.5 mg/dL (ref 0.3–1.2)
Total Protein: 7.3 g/dL (ref 6.5–8.1)

## 2016-10-14 LAB — CBC WITH DIFFERENTIAL/PLATELET
BASOS ABS: 0 10*3/uL (ref 0–0.1)
Basophils Relative: 1 %
Eosinophils Absolute: 0.1 10*3/uL (ref 0–0.7)
Eosinophils Relative: 2 %
HEMATOCRIT: 34.6 % — AB (ref 35.0–47.0)
HEMOGLOBIN: 11.4 g/dL — AB (ref 12.0–16.0)
LYMPHS ABS: 1.1 10*3/uL (ref 1.0–3.6)
LYMPHS PCT: 19 %
MCH: 30 pg (ref 26.0–34.0)
MCHC: 32.8 g/dL (ref 32.0–36.0)
MCV: 91.2 fL (ref 80.0–100.0)
Monocytes Absolute: 0.5 10*3/uL (ref 0.2–0.9)
Monocytes Relative: 9 %
NEUTROS ABS: 4 10*3/uL (ref 1.4–6.5)
Neutrophils Relative %: 69 %
PLATELETS: 180 10*3/uL (ref 150–440)
RBC: 3.79 MIL/uL — AB (ref 3.80–5.20)
RDW: 16.5 % — ABNORMAL HIGH (ref 11.5–14.5)
WBC: 5.7 10*3/uL (ref 3.6–11.0)

## 2016-10-14 LAB — URINALYSIS, COMPLETE (UACMP) WITH MICROSCOPIC
BILIRUBIN URINE: NEGATIVE
Glucose, UA: NEGATIVE mg/dL
KETONES UR: NEGATIVE mg/dL
LEUKOCYTES UA: NEGATIVE
Nitrite: NEGATIVE
PH: 5 (ref 5.0–8.0)
PROTEIN: NEGATIVE mg/dL
Specific Gravity, Urine: 1.009 (ref 1.005–1.030)

## 2016-10-14 LAB — BRAIN NATRIURETIC PEPTIDE: B Natriuretic Peptide: >4500 pg/mL — ABNORMAL HIGH (ref 0.0–100.0)

## 2016-10-14 LAB — LACTIC ACID, PLASMA: LACTIC ACID, VENOUS: 1.5 mmol/L (ref 0.5–1.9)

## 2016-10-14 LAB — TROPONIN I: Troponin I: 0.03 ng/mL (ref <0.03)

## 2016-10-14 MED ORDER — FUROSEMIDE 10 MG/ML IJ SOLN
40.0000 mg | Freq: Once | INTRAMUSCULAR | Status: AC
  Administered 2016-10-14: 40 mg via INTRAVENOUS
  Filled 2016-10-14: qty 4

## 2016-10-14 MED ORDER — NITROGLYCERIN 2 % TD OINT
0.5000 [in_us] | TOPICAL_OINTMENT | Freq: Once | TRANSDERMAL | Status: AC
  Administered 2016-10-14: 0.5 [in_us] via TOPICAL
  Filled 2016-10-14: qty 1

## 2016-10-14 MED ORDER — ENALAPRILAT 1.25 MG/ML IV SOLN
0.6250 mg | Freq: Once | INTRAVENOUS | Status: AC
  Administered 2016-10-14: 0.625 mg via INTRAVENOUS
  Filled 2016-10-14: qty 2

## 2016-10-14 NOTE — ED Provider Notes (Signed)
[facility] Emergency Department Provider Note   ____________________________________________   First MD Initiated Contact with Patient 10/14/16 2010     (approximate)  I have reviewed the triage vital signs and the nursing notes.   HISTORY  Chief Complaint Shortness of Breath    HPI [name] is a 83 y.o. female who reports onset of shortness of breath gradually throughout the course of the day today. She is currently saying she is very short of breath and breathing rapidly using accessory muscles although her oxygen saturations are good. Patient has a history of aortic insufficiency and ingested heart failure. She thinks this is probably her congestive heart failure.   Past Medical History:  Diagnosis Date  . Allergic rhinitis   . Anemia   . Anginal pain ([place])   . Coronary artery disease   . GERD (gastroesophageal reflux disease)   . Heart murmur   . Hyperlipidemia   . Hypertension   . Osteoarthritis of hand   . Osteopenia   . Recurrent pleural effusion on left   . Shortness of breath    "since the heart OR in 05/2012 cause of fluid" (08/17/2012)    Patient Active Problem List   Diagnosis Date Noted  . Palliative care by specialist   . DNR (do not resuscitate)   . Severe aortic stenosis 09/19/2016  . Essential hypertension 09/19/2016  . CKD (chronic kidney disease), stage IV ([place]) 09/19/2016  . Generalized weakness 09/19/2016  . Hyponatremia 09/19/2016  . Hypokalemia 09/19/2016  . Acute systolic heart failure due to valvular disease ([place]) 09/17/2016  . Acute on chronic systolic heart failure ([place]) 08/15/2016  . S/P CABG x 3 06/13/2012  . Coronary artery disease 06/13/2012  . CONSTIPATION, MILD 05/07/2009  . BACK PAIN, LUMBAR 05/07/2009  . LEG PAIN, BILATERAL 05/07/2009  . VERTIGO 05/16/2008  . COLONIC POLYPS, ADENOMATOUS 09/13/2007  . GERD 04/20/2007  . ANXIETY DISORDER, GENERALIZED 08/29/2006  . SYSTEMIC LUPUS ERYTHEMATOSUS  08/29/2006  . HYPERLIPIDEMIA 08/25/2006  . ANEMIA-NOS 08/25/2006  . HYPERTENSION 08/25/2006  . CAROTID ARTERY DISEASE 08/25/2006  . ALLERGIC RHINITIS 08/25/2006  . [place] SYNDROME 08/25/2006  . OSTEOPENIA 08/23/2006    Past Surgical History:  Procedure Laterality Date  . BREAST CYST ASPIRATION Left ~ 1948   "benign" (08/17/2012)  . carotid testing negative    . CHEST TUBE INSERTION Left 08/17/2012   Procedure: INSERTION PLEURAL DRAINAGE CATHETER;  Surgeon: Melrose Nakayama, MD;  Location: [place];  Service: Thoracic;  Laterality: Left;  . COLONOSCOPY  9/02   polyp, hematochezia adenoma due in 5 yrs.  . CORONARY ANGIOPLASTY    . CORONARY ARTERY BYPASS GRAFT N/A 06/08/2012   Procedure: CORONARY ARTERY BYPASS GRAFTING (CABG);  Surgeon: Melrose Nakayama, MD;  Location: [place];  Service: Open Heart Surgery;  Laterality: N/A;  . DEXA OP  03/02  . INSERTION / PLACEMENT PLEURAL CATHETER Left 08/17/2012  . stress Echo negative  7/04  . TEE WITHOUT CARDIOVERSION N/A 06/08/2012   Procedure: TRANSESOPHAGEAL ECHOCARDIOGRAM (TEE);  Surgeon: Melrose Nakayama, MD;  Location: [place];  Service: Open Heart Surgery;  Laterality: N/A;  . TSpine XRay Scoliosis  11/09/2004   mild thoracic    Prior to Admission medications   Medication Sig Start Date End Date Taking? Authorizing Provider  acetaminophen (TYLENOL) 500 MG tablet Take 500 mg by mouth 2 (two) times daily. 06/13/12   Barrett, Erin R, PA-C  ALPRAZolam (XANAX) 0.25 MG tablet Take 0.25 mg by  mouth at bedtime.     [provider]  apixaban (ELIQUIS) 2.5 MG TABS tablet Take 1 tablet by mouth daily.    [provider]  calcium carbonate (OSCAL) 1500 (600 Ca) MG TABS tablet Take 600 mg of elemental calcium by mouth 2 (two) times daily with a meal.    [provider]  cycloSPORINE (RESTASIS) 0.05 % ophthalmic emulsion Place 1 drop into both eyes 2 (two) times daily.    [provider]  ferrous sulfate 325 (65 FE)  MG tablet Take 325 mg by mouth daily at 12 noon.    [provider]  furosemide (LASIX) 40 MG tablet Take 1 tablet (40 mg total) by mouth daily. Can have one additional pill a day as needed, thank you 09/20/16 09/20/17  Theodoro Grist, MD  hydroxychloroquine (PLAQUENIL) 200 MG tablet Take 1 tablet by mouth daily. 09/06/16   [provider]  hydroxypropyl methylcellulose (ISOPTO TEARS) 2.5 % ophthalmic solution Place 1 drop into both eyes 2 (two) times daily as needed (dry eyes).    [provider]  levothyroxine (SYNTHROID, LEVOTHROID) 50 MCG tablet Take 50 mcg by mouth daily before breakfast.  07/24/12   [provider]  magnesium hydroxide (MILK OF MAGNESIA) 400 MG/5ML suspension Take 10 mLs by mouth daily as needed for constipation.    [provider]  magnesium oxide (MAG-OX) 400 (241.3 Mg) MG tablet Take 1 tablet (400 mg total) by mouth daily. 09/20/16   Theodoro Grist, MD  meclizine (ANTIVERT) 25 MG tablet Take 25 mg by mouth 3 (three) times daily as needed for dizziness.    [provider]  omeprazole (PRILOSEC) 20 MG capsule Take 1 capsule by mouth as needed.    [provider]  polyethylene glycol powder (GLYCOLAX/MIRALAX) powder  09/15/12   [provider]  potassium chloride SA (K-DUR,KLOR-CON) 20 MEQ tablet Take 1 tablet (20 mEq total) by mouth daily. 09/20/16   Theodoro Grist, MD  pravastatin (PRAVACHOL) 40 MG tablet Take 40 mg by mouth at bedtime.     [provider]  vitamin B-12 (CYANOCOBALAMIN) 1000 MCG tablet Take 1,000 mcg by mouth daily.    [provider]    Allergies Alendronate sodium and Simvastatin  Family History  Problem Relation Age of Onset  . Heart disease Mother   . Hypertension Mother   . Cancer Father 73       liver  . Alzheimer's disease Sister   . Hypertension Brother   . Sudden death Brother   . Hypertension Brother 77  . Heart disease Brother   . Hypertension Brother     . Alzheimer's disease Sister     Social History Social History  Substance Use Topics  . Smoking status: Never Smoker  . Smokeless tobacco: Never Used  . Alcohol use No    Review of Systems  Constitutional: No fever/chills Eyes: No visual changes. ENT: No sore throat. Cardiovascular: Denies chest pain. Respiratory:shortness of breath. Gastrointestinal: No abdominal pain.  No nausea, no vomiting.  No diarrhea.  No constipation. Genitourinary: Negative for dysuria. Musculoskeletal: Negative for back pain. Skin: Negative for rash. Neurological: Negative for headaches, focal weakness or numbness.   ____________________________________________   PHYSICAL EXAM:  VITAL SIGNS: ED Triage Vitals  Enc Vitals Group     BP 10/14/16 2001 (!) 147/55     Pulse Rate 10/14/16 2001 (!) 108     Resp 10/14/16 2001 20     Temp --  Temp src --      SpO2 10/14/16 2001 100 %     Weight 10/14/16 2002 97 lb (44 kg)     Height --      Head Circumference --      Peak Flow --      Pain Score --      Pain Loc --      Pain Edu? --      Excl. in [place]? --     Constitutional: Alert and oriented. Well appearing and in  acute distress. Eyes: Conjunctivae are normal.  Head: Atraumatic. Nose: No congestion/rhinnorhea. Mouth/Throat: Mucous membranes are moist.  Oropharynx non-erythematous. Neck: No stridor.  Cardiovascular: Normal rate, regular rhythm. Grossly normal heart sounds.  Good peripheral circulation. Respiratory: Normal respiratory effort.  No retractions. Lungs Some Crackles scattered throughout Gastrointestinal: Soft and nontender. No distention. No abdominal bruits. No CVA tenderness. Musculoskeletal: No lower extremity tenderness nor edema.  No joint effusions. Neurologic:  Normal speech and language. No gross focal neurologic deficits are appreciated.  Skin:  Skin is warm, dry and intact. No rash noted. Psychiatric: Mood and affect are normal. Speech and behavior are  normal.  ____________________________________________   LABS (all labs ordered are listed, but only abnormal results are displayed)  Labs Reviewed  COMPREHENSIVE METABOLIC PANEL - Abnormal; Notable for the following:       Result Value   Sodium 131 (*)    Chloride 99 (*)    Glucose, Bld 108 (*)    BUN 21 (*)    Creatinine, Ser 1.75 (*)    Calcium 8.6 (*)    Albumin 3.3 (*)    ALT 10 (*)    GFR calc non Af Amer 24 (*)    GFR calc Af Amer 28 (*)    All other components within normal limits  BRAIN NATRIURETIC PEPTIDE - Abnormal; Notable for the following:    B Natriuretic Peptide >4,500.0 (*)    All other components within normal limits  TROPONIN I - Abnormal; Notable for the following:    Troponin I 0.03 (*)    All other components within normal limits  CBC WITH DIFFERENTIAL/PLATELET - Abnormal; Notable for the following:    RBC 3.79 (*)    Hemoglobin 11.4 (*)    HCT 34.6 (*)    RDW 16.5 (*)    All other components within normal limits  URINALYSIS, COMPLETE (UACMP) WITH MICROSCOPIC - Abnormal; Notable for the following:    Color, Urine YELLOW (*)    APPearance CLEAR (*)    Hgb urine dipstick SMALL (*)    Bacteria, UA RARE (*)    Squamous Epithelial / LPF 0-5 (*)    All other components within normal limits  LACTIC ACID, PLASMA  LACTIC ACID, PLASMA   ____________________________________________  EKG  EKG reviewed by me shows sinus tachycardia 109 left axis some ST segment depression was present on previous EKG ____________________________________________  RADIOLOGY  CHF ____________________________________________   PROCEDURES  Procedure(s) performed:  Procedures  Critical Care performed:   ____________________________________________   INITIAL IMPRESSION / ASSESSMENT AND PLAN / ED COURSE  Pertinent labs & imaging results that were available during my care of the patient were reviewed by me and considered in my medical decision making (see chart for  details).   Patient improves over time and was taken off BiPAP seemed to do well.     ____________________________________________   FINAL CLINICAL IMPRESSION(S) / ED DIAGNOSES  Final diagnoses:  Congestive heart failure, unspecified  HF chronicity, unspecified heart failure type ([place])      NEW MEDICATIONS STARTED DURING THIS VISIT:  New Prescriptions   No medications on file     Note:  This document was prepared using Dragon voice recognition software and may include unintentional dictation errors.    Nena Polio, MD 10/15/16 [phone]

## 2016-10-14 NOTE — H&P (Signed)
History and Physical   [facility] - [place] @ [facility] Admission History and Physical [employer], D.O.    Patient Name: [name] MR#: [id number] Date of Birth: 07/07/1923 Date of Admission: 10/14/2016  Referring MD/NP/PA: Dr. Gaetano Hawthorne Primary Care Physician: Rusty Aus, MD Patient coming from: Home Outpatient Specialists: Dr. Saralyn Pilar   Chief Complaint:  Chief Complaint  Patient presents with  . Shortness of Breath    HPI: [name] is a 83 y.o. female with a known history of  CAD, systolic CHF EF 96-75%, severe AS, GERD, HTN, HLD, OA, osteopenia presents to the emergency department for evaluation of shortness of breath.  Patient was in a usual state of health until the past two days or so when she reports gradually worsening shortness of breath, dyspnea on exertion despite compliance with diet and medication. She experiences episodes of severe dyspnea even with minimal exertion.  She has been on home O2.  Patient denies fevers/chills, weakness, dizziness, chest pain, shortness of breath, N/V/C/D, abdominal pain, dysuria/frequency, changes in mental status.    Of note she was admitted to this hospital on 09/17/2016 with acute exacerbation of systolic congestive heart failure secondary to severe AS. She was continued on Lasix twice a day and discharged home.   Otherwise there has been no change in status. Patient has been taking medication as prescribed and there has been no recent change in medication or diet.  No recent antibiotics.  There has been no recent illness, hospitalizations, travel or sick contacts.    EMS/ED Course: Patient received Lasix 40, NTG paste 1inch, Vasotec IV and was placed on BiPAP for respiratory distress..  Medical admission was requested for continued management of acute exacerbation of systolic congestive heart failure 2/2 severe AS.   Review of Systems:  CONSTITUTIONAL: Nofever/chills, fatigue, weakness, weight gain/loss,  headache. EYES: No blurry or double vision. ENT: No tinnitus, postnasal drip, redness or soreness of the oropharynx. RESPIRATORY: Positive cough, dyspnea, wheeze.  No hemoptysis.  CARDIOVASCULAR: No chest pain, palpitations, syncope, orthopnea. No lower extremity edema.  GASTROINTESTINAL: No nausea, vomiting, abdominal pain, diarrhea, constipation.  No hematemesis, melena or hematochezia. GENITOURINARY: No dysuria, frequency, hematuria. ENDOCRINE: No polyuria or nocturia. No heat or cold intolerance. HEMATOLOGY: No anemia, bruising, bleeding. INTEGUMENTARY: No rashes, ulcers, lesions. MUSCULOSKELETAL: No arthritis, gout, dyspnea. NEUROLOGIC: No numbness, tingling, ataxia, seizure-type activity, weakness. PSYCHIATRIC: No anxiety, depression, insomnia.   Past Medical History:  Diagnosis Date  . Allergic rhinitis   . Anemia   . Anginal pain ([place])   . Coronary artery disease   . GERD (gastroesophageal reflux disease)   . Heart murmur   . Hyperlipidemia   . Hypertension   . Osteoarthritis of hand   . Osteopenia   . Recurrent pleural effusion on left   . Shortness of breath    "since the heart OR in 05/2012 cause of fluid" (08/17/2012)    Past Surgical History:  Procedure Laterality Date  . BREAST CYST ASPIRATION Left ~ 1948   "benign" (08/17/2012)  . carotid testing negative    . CHEST TUBE INSERTION Left 08/17/2012   Procedure: INSERTION PLEURAL DRAINAGE CATHETER;  Surgeon: Melrose Nakayama, MD;  Location: [place];  Service: Thoracic;  Laterality: Left;  . COLONOSCOPY  9/02   polyp, hematochezia adenoma due in 5 yrs.  . CORONARY ANGIOPLASTY    . CORONARY ARTERY BYPASS GRAFT N/A 06/08/2012   Procedure: CORONARY ARTERY BYPASS GRAFTING (CABG);  Surgeon: Melrose Nakayama, MD;  [facility]  OR;  Service: Open Heart Surgery;  Laterality: N/A;  . DEXA OP  03/02  . INSERTION / PLACEMENT PLEURAL CATHETER Left 08/17/2012  . stress Echo negative  7/04  . TEE WITHOUT CARDIOVERSION N/A  06/08/2012   Procedure: TRANSESOPHAGEAL ECHOCARDIOGRAM (TEE);  Surgeon: Melrose Nakayama, MD;  Location: [place];  Service: Open Heart Surgery;  Laterality: N/A;  . TSpine XRay Scoliosis  11/09/2004   mild thoracic     reports that she has never smoked. She has never used smokeless tobacco. She reports that she does not drink alcohol or use drugs.  Allergies  Allergen Reactions  . Alendronate Sodium Other (See Comments)    REACTION: GI upset  . Simvastatin Other (See Comments)    Feet feel heavy    Family History  Problem Relation Age of Onset  . Heart disease Mother   . Hypertension Mother   . Cancer Father 49       liver  . Alzheimer's disease Sister   . Hypertension Brother   . Sudden death Brother   . Hypertension Brother 21  . Heart disease Brother   . Hypertension Brother   . Alzheimer's disease Sister     Prior to Admission medications   Medication Sig Start Date End Date Taking? Authorizing Provider  acetaminophen (TYLENOL) 500 MG tablet Take 500 mg by mouth 2 (two) times daily. 06/13/12   Barrett, Erin R, PA-C  ALPRAZolam (XANAX) 0.25 MG tablet Take 0.25 mg by mouth at bedtime.     [provider]  apixaban (ELIQUIS) 2.5 MG TABS tablet Take 1 tablet by mouth daily.    [provider]  calcium carbonate (OSCAL) 1500 (600 Ca) MG TABS tablet Take 600 mg of elemental calcium by mouth 2 (two) times daily with a meal.    [provider]  cycloSPORINE (RESTASIS) 0.05 % ophthalmic emulsion Place 1 drop into both eyes 2 (two) times daily.    [provider]  ferrous sulfate 325 (65 FE) MG tablet Take 325 mg by mouth daily at 12 noon.    [provider]  furosemide (LASIX) 40 MG tablet Take 1 tablet (40 mg total) by mouth daily. Can have one additional pill a day as needed, thank you 09/20/16 09/20/17  Theodoro Grist, MD  hydroxychloroquine (PLAQUENIL) 200 MG tablet Take 1 tablet by mouth daily. 09/06/16   [provider]   hydroxypropyl methylcellulose (ISOPTO TEARS) 2.5 % ophthalmic solution Place 1 drop into both eyes 2 (two) times daily as needed (dry eyes).    [provider]  levothyroxine (SYNTHROID, LEVOTHROID) 50 MCG tablet Take 50 mcg by mouth daily before breakfast.  07/24/12   [provider]  magnesium hydroxide (MILK OF MAGNESIA) 400 MG/5ML suspension Take 10 mLs by mouth daily as needed for constipation.    [provider]  magnesium oxide (MAG-OX) 400 (241.3 Mg) MG tablet Take 1 tablet (400 mg total) by mouth daily. 09/20/16   Theodoro Grist, MD  meclizine (ANTIVERT) 25 MG tablet Take 25 mg by mouth 3 (three) times daily as needed for dizziness.    [provider]  omeprazole (PRILOSEC) 20 MG capsule Take 1 capsule by mouth as needed.    [provider]  polyethylene glycol powder (GLYCOLAX/MIRALAX) powder  09/15/12   [provider]  potassium chloride SA (K-DUR,KLOR-CON) 20 MEQ tablet Take 1 tablet (20 mEq total) by mouth daily. 09/20/16   Theodoro Grist, MD  pravastatin (PRAVACHOL) 40 MG tablet Take 40 mg  by mouth at bedtime.     [provider]  vitamin B-12 (CYANOCOBALAMIN) 1000 MCG tablet Take 1,000 mcg by mouth daily.    [provider]    Physical Exam: Vitals:   10/14/16 2039 10/14/16 2100 10/14/16 2130 10/14/16 2200  BP:  133/69 139/61 (!) 152/71  Pulse:  (!) 105 (!) 101 (!) 102  Resp:  (!) 33 (!) 29 (!) 34  SpO2: 99% 99% 99% 100%  Weight:        GENERAL: 83 y.o.-year-old female patient, thin, frail lying in the bed in no acute distress.  Pleasant and cooperative.   HEENT: Head atraumatic, normocephalic. Pupils equal, round, reactive to light and accommodation. No scleral icterus. Extraocular muscles intact. Nares are patent. Oropharynx is clear. Mucus membranes dry. NECK: Supple, full range of motion.  CHEST: Bibasilar rales, slight expiratory wheeze.  No use of accessory muscles of respiration.  No reproducible  chest wall tenderness. Midline scar, well-healed CARDIOVASCULAR: S1, S2 normal. No murmurs, rubs, or gallops. Cap refill <2 seconds. Pulses intact distally.  ABDOMEN: Soft, nondistended, nontender. No rebound, guarding, rigidity. Normoactive bowel sounds present in all four quadrants. No organomegaly or mass. EXTREMITIES: No pedal edema, cyanosis, or clubbing. No calf tenderness or Homan's sign.  NEUROLOGIC: The patient is alert and oriented x 3. Cranial nerves II through XII are grossly intact with no focal sensorimotor deficit. Muscle strength 5/5 in all extremities. Sensation intact. Gait not checked. PSYCHIATRIC:  Normal affect, mood, thought content. SKIN: Warm, dry, and intact without obvious rash, lesion, or ulcer.     Labs on Admission:  CBC:  Recent Labs Lab 10/14/16 2039  WBC 5.7  NEUTROABS 4.0  HGB 11.4*  HCT 34.6*  MCV 91.2  PLT 299   Basic Metabolic Panel:  Recent Labs Lab 10/14/16 2039  NA 131*  K 4.4  CL 99*  CO2 23  GLUCOSE 108*  BUN 21*  CREATININE 1.75*  CALCIUM 8.6*   GFR: Estimated Creatinine Clearance: 14.5 mL/min (A) (by C-G formula based on SCr of 1.75 mg/dL (H)). Liver Function Tests:  Recent Labs Lab 10/14/16 2039  AST 23  ALT 10*  ALKPHOS 65  BILITOT 0.5  PROT 7.3  ALBUMIN 3.3*   No results for input(s): LIPASE, AMYLASE in the last 168 hours. No results for input(s): AMMONIA in the last 168 hours. Coagulation Profile: No results for input(s): INR, PROTIME in the last 168 hours. Cardiac Enzymes:  Recent Labs Lab 10/14/16 2039  TROPONINI 0.03*   BNP (last 3 results) No results for input(s): PROBNP in the last 8760 hours. HbA1C: No results for input(s): HGBA1C in the last 72 hours. CBG: No results for input(s): GLUCAP in the last 168 hours. Lipid Profile: No results for input(s): CHOL, HDL, LDLCALC, TRIG, CHOLHDL, LDLDIRECT in the last 72 hours. Thyroid Function Tests: No results for input(s): TSH, T4TOTAL, FREET4,  T3FREE, THYROIDAB in the last 72 hours. Anemia Panel: No results for input(s): VITAMINB12, FOLATE, FERRITIN, TIBC, IRON, RETICCTPCT in the last 72 hours. Urine analysis:    Component Value Date/Time   COLORURINE YELLOW (A) 10/14/2016 2143   APPEARANCEUR CLEAR (A) 10/14/2016 2143   APPEARANCEUR Clear 01/28/2013 1542   LABSPEC 1.009 10/14/2016 2143   LABSPEC 1.008 01/28/2013 1542   PHURINE 5.0 10/14/2016 2143   GLUCOSEU NEGATIVE 10/14/2016 2143   GLUCOSEU 50 mg/dL 01/28/2013 1542   HGBUR SMALL (A) 10/14/2016 2143   BILIRUBINUR NEGATIVE 10/14/2016 2143   BILIRUBINUR Negative 01/28/2013 [place]  10/14/2016 2143   PROTEINUR NEGATIVE 10/14/2016 2143   UROBILINOGEN 0.2 06/06/2012 1619   NITRITE NEGATIVE 10/14/2016 2143   LEUKOCYTESUR NEGATIVE 10/14/2016 2143   LEUKOCYTESUR Negative 01/28/2013 1542   Sepsis Labs: @LABRCNTIP (procalcitonin:4,lacticidven:4) )No results found for this or any previous visit (from the past 240 hour(s)).   Radiological Exams on Admission: Dg Chest Portable 1 View  Result Date: 10/14/2016 CLINICAL DATA:  Acute shortness of breath today. EXAM: PORTABLE CHEST 1 VIEW COMPARISON:  09/17/2016 and prior studies FINDINGS: Cardiomegaly, CABG changes and left-sided pacemaker again noted. An enlarging moderate right pleural effusion noted with right lower lung atelectasis. Scattered atelectasis within the mid and lower left lung again identified. Pulmonary vascular congestion is present. There is no evidence of pneumothorax. IMPRESSION: Enlarging moderate right pleural effusion and right lower lung atelectasis. Cardiomegaly and pulmonary vascular congestion. Electronically Signed   By: Margarette Canada M.D.   On: 10/14/2016 20:27    EKG: Sinus tach at 109 bpm with leftward axis, ST segment depressions in lead II, V6.  ST elevation in V4. and nonspecific ST-T wave changes.   ECHO: 08/15/16 Study Conclusions  - Left ventricle: The cavity size was mildly  dilated. Wall thickness was increased in a pattern of mild LVH. Systolic function was moderately to severely reduced. The estimated ejection fraction was in the range of 30% to 35%. Diffuse hypokinesis. Akinesis of the anteroseptal myocardium. - Aortic valve: There was severe stenosis. There was moderate regurgitation. Valve area (VTI): 0.39 cm^2. Valve area (Vmax): 0.42 cm^2. Valve area (Vmean): 0.37 cm^2. - Mitral valve: There was moderate regurgitation. Valve area by continuity equation (using LVOT flow): 1.31 cm^2. - Left atrium: The atrium was mildly dilated.   Assessment/Plan  This is a 83 y.o. female with a history of cardiomyopathy, CAD s/p CABG, PAF, systolic CHF EF 08-81%, severe AS, GERD, HTN, HLD, OA, osteopenia now being admitted with:  #. Acute exacerbation of congestive heart failure - Admit inpatient, telemetry monitoring. - Lasix IV. Continue potassium. Add metoprolol, enalapril.  - Intake/output, daily weight. - Trend troponins, check lipids and TSH. - Echo done 08/15/16 - Cardiology consultation requested - Dr. Saralyn Pilar  #. History of paroxysmal atrial fibrillation - Continue Eliquis, metoprolol  #. History of hypothyroidism - Continue levothyroxine  #. History of CKD - Monitor BMP in AM  #. History of HLD - Continue pravastatin  #. History of GERD -Continue Protonix  Admission status: Inpatient, tele IV Fluids: HL Diet/Nutrition: Heart healthy Consults called: Cardio DVT Px: Eliquis SCDs and early ambulation. Code Status: Full Code  Disposition Plan: To be determined in 2-3 days   All the records are reviewed and case discussed with ED provider. Management plans discussed with the patient and/or family who express understanding and agree with plan of care.  [name] D.O. on 10/14/2016 at 10:14 PM Between 7am to 6pm - Pager - [phone] After 6pm go to [email or web address] - [employer] [facility] [place]  [facility] Office [phone] CC: Primary care physician; Rusty Aus, MD   10/14/2016, 10:14 PM

## 2016-10-14 NOTE — ED Notes (Signed)
Assisted pt with bedpan

## 2016-10-14 NOTE — ED Notes (Signed)
Date and time results received: 10/14/16 2155 (use smartphrase ".now" to insert current time)  Test: troponin Critical Value: 0.03  Name of Provider Notified: Dr. Cinda Quest  Orders Received? Or Actions Taken?: Orders Received - See Orders for details

## 2016-10-14 NOTE — ED Triage Notes (Signed)
Pt in with co [facility] since noon today pt has hx of chf and pleural effusion. In on o2 at 3l all the time, denies any cp.

## 2016-10-14 NOTE — ED Notes (Signed)
Pt repositioned and pulled up in bed  

## 2016-10-15 DIAGNOSIS — Z951 Presence of aortocoronary bypass graft: Secondary | ICD-10-CM | POA: Diagnosis not present

## 2016-10-15 DIAGNOSIS — I352 Nonrheumatic aortic (valve) stenosis with insufficiency: Secondary | ICD-10-CM | POA: Diagnosis present

## 2016-10-15 DIAGNOSIS — K219 Gastro-esophageal reflux disease without esophagitis: Secondary | ICD-10-CM | POA: Diagnosis present

## 2016-10-15 DIAGNOSIS — Z66 Do not resuscitate: Secondary | ICD-10-CM | POA: Diagnosis present

## 2016-10-15 DIAGNOSIS — Z515 Encounter for palliative care: Secondary | ICD-10-CM | POA: Diagnosis not present

## 2016-10-15 DIAGNOSIS — I38 Endocarditis, valve unspecified: Secondary | ICD-10-CM | POA: Diagnosis not present

## 2016-10-15 DIAGNOSIS — R0602 Shortness of breath: Secondary | ICD-10-CM | POA: Diagnosis present

## 2016-10-15 DIAGNOSIS — F410 Panic disorder [episodic paroxysmal anxiety] without agoraphobia: Secondary | ICD-10-CM | POA: Diagnosis present

## 2016-10-15 DIAGNOSIS — I480 Paroxysmal atrial fibrillation: Secondary | ICD-10-CM | POA: Diagnosis present

## 2016-10-15 DIAGNOSIS — Z7901 Long term (current) use of anticoagulants: Secondary | ICD-10-CM | POA: Diagnosis not present

## 2016-10-15 DIAGNOSIS — Z7189 Other specified counseling: Secondary | ICD-10-CM | POA: Diagnosis not present

## 2016-10-15 DIAGNOSIS — I5023 Acute on chronic systolic (congestive) heart failure: Secondary | ICD-10-CM | POA: Diagnosis present

## 2016-10-15 DIAGNOSIS — E875 Hyperkalemia: Secondary | ICD-10-CM | POA: Diagnosis present

## 2016-10-15 DIAGNOSIS — Z79899 Other long term (current) drug therapy: Secondary | ICD-10-CM | POA: Diagnosis not present

## 2016-10-15 DIAGNOSIS — Z8249 Family history of ischemic heart disease and other diseases of the circulatory system: Secondary | ICD-10-CM | POA: Diagnosis not present

## 2016-10-15 DIAGNOSIS — Z9981 Dependence on supplemental oxygen: Secondary | ICD-10-CM | POA: Diagnosis not present

## 2016-10-15 DIAGNOSIS — I5021 Acute systolic (congestive) heart failure: Secondary | ICD-10-CM | POA: Diagnosis not present

## 2016-10-15 DIAGNOSIS — E039 Hypothyroidism, unspecified: Secondary | ICD-10-CM | POA: Diagnosis present

## 2016-10-15 DIAGNOSIS — I350 Nonrheumatic aortic (valve) stenosis: Secondary | ICD-10-CM | POA: Diagnosis not present

## 2016-10-15 DIAGNOSIS — T502X5A Adverse effect of carbonic-anhydrase inhibitors, benzothiadiazides and other diuretics, initial encounter: Secondary | ICD-10-CM | POA: Diagnosis present

## 2016-10-15 DIAGNOSIS — I2510 Atherosclerotic heart disease of native coronary artery without angina pectoris: Secondary | ICD-10-CM | POA: Diagnosis present

## 2016-10-15 DIAGNOSIS — G4700 Insomnia, unspecified: Secondary | ICD-10-CM | POA: Diagnosis present

## 2016-10-15 DIAGNOSIS — Z9861 Coronary angioplasty status: Secondary | ICD-10-CM | POA: Diagnosis not present

## 2016-10-15 DIAGNOSIS — J9621 Acute and chronic respiratory failure with hypoxia: Secondary | ICD-10-CM | POA: Diagnosis present

## 2016-10-15 DIAGNOSIS — N184 Chronic kidney disease, stage 4 (severe): Secondary | ICD-10-CM | POA: Diagnosis present

## 2016-10-15 DIAGNOSIS — E785 Hyperlipidemia, unspecified: Secondary | ICD-10-CM | POA: Diagnosis present

## 2016-10-15 DIAGNOSIS — M069 Rheumatoid arthritis, unspecified: Secondary | ICD-10-CM | POA: Diagnosis present

## 2016-10-15 DIAGNOSIS — I130 Hypertensive heart and chronic kidney disease with heart failure and stage 1 through stage 4 chronic kidney disease, or unspecified chronic kidney disease: Secondary | ICD-10-CM | POA: Diagnosis present

## 2016-10-15 DIAGNOSIS — N179 Acute kidney failure, unspecified: Secondary | ICD-10-CM | POA: Diagnosis present

## 2016-10-15 DIAGNOSIS — I429 Cardiomyopathy, unspecified: Secondary | ICD-10-CM | POA: Diagnosis present

## 2016-10-15 LAB — TSH: TSH: 2.676 u[IU]/mL (ref 0.350–4.500)

## 2016-10-15 LAB — BASIC METABOLIC PANEL
Anion gap: 8 (ref 5–15)
BUN: 21 mg/dL — AB (ref 6–20)
CALCIUM: 8.3 mg/dL — AB (ref 8.9–10.3)
CHLORIDE: 99 mmol/L — AB (ref 101–111)
CO2: 26 mmol/L (ref 22–32)
CREATININE: 1.84 mg/dL — AB (ref 0.44–1.00)
GFR calc non Af Amer: 23 mL/min — ABNORMAL LOW (ref >60)
GFR, EST AFRICAN AMERICAN: 26 mL/min — AB (ref >60)
GLUCOSE: 99 mg/dL (ref 65–99)
Potassium: 4 mmol/L (ref 3.5–5.1)
Sodium: 133 mmol/L — ABNORMAL LOW (ref 135–145)

## 2016-10-15 LAB — LACTIC ACID, PLASMA: LACTIC ACID, VENOUS: 1.2 mmol/L (ref 0.5–1.9)

## 2016-10-15 LAB — TROPONIN I
TROPONIN I: 0.03 ng/mL — AB (ref <0.03)
Troponin I: 0.03 ng/mL (ref <0.03)

## 2016-10-15 MED ORDER — MAGNESIUM OXIDE 400 (241.3 MG) MG PO TABS
400.0000 mg | ORAL_TABLET | Freq: Every day | ORAL | Status: DC
  Administered 2016-10-15 – 2016-10-20 (×6): 400 mg via ORAL
  Filled 2016-10-15 (×6): qty 1

## 2016-10-15 MED ORDER — ACETAMINOPHEN 325 MG PO TABS
650.0000 mg | ORAL_TABLET | ORAL | Status: DC | PRN
  Administered 2016-10-15 – 2016-10-19 (×2): 650 mg via ORAL
  Filled 2016-10-15: qty 2

## 2016-10-15 MED ORDER — FUROSEMIDE 10 MG/ML IJ SOLN: 40.0000 mg | Freq: Two times a day (BID) | INTRAMUSCULAR | Status: DC

## 2016-10-15 MED ORDER — ALPRAZOLAM 0.25 MG PO TABS
0.2500 mg | ORAL_TABLET | Freq: Once | ORAL | Status: AC
  Administered 2016-10-15: 0.25 mg via ORAL
  Filled 2016-10-15: qty 1

## 2016-10-15 MED ORDER — METOPROLOL SUCCINATE ER 25 MG PO TB24
12.5000 mg | ORAL_TABLET | Freq: Every day | ORAL | Status: DC
  Administered 2016-10-15 – 2016-10-20 (×6): 12.5 mg via ORAL
  Filled 2016-10-15 (×7): qty 1

## 2016-10-15 MED ORDER — VITAMIN B-12 1000 MCG PO TABS
1000.0000 ug | ORAL_TABLET | Freq: Every day | ORAL | Status: DC
  Administered 2016-10-15 – 2016-10-20 (×6): 1000 ug via ORAL
  Filled 2016-10-15 (×6): qty 1

## 2016-10-15 MED ORDER — ENALAPRIL MALEATE 2.5 MG PO TABS
2.5000 mg | ORAL_TABLET | Freq: Two times a day (BID) | ORAL | Status: DC
  Administered 2016-10-15 – 2016-10-16 (×3): 2.5 mg via ORAL
  Filled 2016-10-15 (×3): qty 1

## 2016-10-15 MED ORDER — POTASSIUM CHLORIDE CRYS ER 20 MEQ PO TBCR
20.0000 meq | EXTENDED_RELEASE_TABLET | Freq: Every day | ORAL | Status: DC
  Administered 2016-10-15 – 2016-10-18 (×4): 20 meq via ORAL
  Filled 2016-10-15 (×5): qty 1

## 2016-10-15 MED ORDER — FERROUS SULFATE 325 (65 FE) MG PO TABS
325.0000 mg | ORAL_TABLET | Freq: Every day | ORAL | Status: DC
  Administered 2016-10-15 – 2016-10-20 (×6): 325 mg via ORAL
  Filled 2016-10-15 (×6): qty 1

## 2016-10-15 MED ORDER — PRAVASTATIN SODIUM 40 MG PO TABS
40.0000 mg | ORAL_TABLET | Freq: Every day | ORAL | Status: DC
  Administered 2016-10-15 – 2016-10-19 (×5): 40 mg via ORAL
  Filled 2016-10-15 (×6): qty 1

## 2016-10-15 MED ORDER — CALCIUM CARBONATE 1500 (600 CA) MG PO TABS
600.0000 mg | ORAL_TABLET | Freq: Two times a day (BID) | ORAL | Status: DC
  Administered 2016-10-15: 1500 mg via ORAL
  Filled 2016-10-15 (×2): qty 1

## 2016-10-15 MED ORDER — SODIUM CHLORIDE 0.9% FLUSH: 3.0000 mL | INTRAVENOUS | Status: DC | PRN

## 2016-10-15 MED ORDER — FUROSEMIDE 10 MG/ML IJ SOLN
40.0000 mg | Freq: Every day | INTRAMUSCULAR | Status: DC
  Administered 2016-10-15: 40 mg via INTRAVENOUS
  Filled 2016-10-15: qty 4

## 2016-10-15 MED ORDER — HYDROXYCHLOROQUINE SULFATE 200 MG PO TABS
200.0000 mg | ORAL_TABLET | Freq: Every day | ORAL | Status: DC
  Administered 2016-10-15 – 2016-10-20 (×6): 200 mg via ORAL
  Filled 2016-10-15 (×6): qty 1

## 2016-10-15 MED ORDER — POLYVINYL ALCOHOL 1.4 % OP SOLN
1.0000 [drp] | Freq: Two times a day (BID) | OPHTHALMIC | Status: DC | PRN
  Filled 2016-10-15 (×2): qty 15

## 2016-10-15 MED ORDER — ALPRAZOLAM 0.25 MG PO TABS
0.2500 mg | ORAL_TABLET | Freq: Every day | ORAL | Status: DC
  Administered 2016-10-15 – 2016-10-19 (×5): 0.25 mg via ORAL
  Filled 2016-10-15 (×5): qty 1

## 2016-10-15 MED ORDER — LEVOTHYROXINE SODIUM 50 MCG PO TABS
50.0000 ug | ORAL_TABLET | Freq: Every day | ORAL | Status: DC
  Administered 2016-10-15 – 2016-10-20 (×6): 50 ug via ORAL
  Filled 2016-10-15 (×6): qty 1

## 2016-10-15 MED ORDER — CYCLOSPORINE 0.05 % OP EMUL
1.0000 [drp] | Freq: Two times a day (BID) | OPHTHALMIC | Status: DC
  Administered 2016-10-15 – 2016-10-20 (×11): 1 [drp] via OPHTHALMIC
  Filled 2016-10-15 (×13): qty 1

## 2016-10-15 MED ORDER — FUROSEMIDE 10 MG/ML IJ SOLN
40.0000 mg | Freq: Two times a day (BID) | INTRAMUSCULAR | Status: DC
  Administered 2016-10-15 – 2016-10-16 (×2): 40 mg via INTRAVENOUS
  Filled 2016-10-15 (×2): qty 4

## 2016-10-15 MED ORDER — HYPROMELLOSE (GONIOSCOPIC) 2.5 % OP SOLN
1.0000 [drp] | Freq: Two times a day (BID) | OPHTHALMIC | Status: DC | PRN
  Filled 2016-10-15: qty 15

## 2016-10-15 MED ORDER — ACETAMINOPHEN 500 MG PO TABS
500.0000 mg | ORAL_TABLET | Freq: Two times a day (BID) | ORAL | Status: DC
  Administered 2016-10-15 – 2016-10-20 (×11): 500 mg via ORAL
  Filled 2016-10-15 (×12): qty 1

## 2016-10-15 MED ORDER — CALCIUM CARBONATE ANTACID 500 MG PO CHEW
600.0000 mg | CHEWABLE_TABLET | Freq: Two times a day (BID) | ORAL | Status: DC
Start: 2016-10-15 — End: 2016-10-20
  Administered 2016-10-15 – 2016-10-20 (×9): 600 mg via ORAL
  Filled 2016-10-15 (×9): qty 3

## 2016-10-15 MED ORDER — SODIUM CHLORIDE 0.9% FLUSH
3.0000 mL | Freq: Two times a day (BID) | INTRAVENOUS | Status: DC
Start: 2016-10-15 — End: 2016-10-20
  Administered 2016-10-15 – 2016-10-20 (×11): 3 mL via INTRAVENOUS

## 2016-10-15 MED ORDER — APIXABAN 2.5 MG PO TABS
2.5000 mg | ORAL_TABLET | Freq: Every day | ORAL | Status: DC
  Administered 2016-10-15 – 2016-10-20 (×6): 2.5 mg via ORAL
  Filled 2016-10-15 (×6): qty 1

## 2016-10-15 MED ORDER — ONDANSETRON HCL 4 MG/2ML IJ SOLN: 4.0000 mg | Freq: Four times a day (QID) | INTRAMUSCULAR | Status: DC | PRN

## 2016-10-15 MED ORDER — SODIUM CHLORIDE 0.9 % IV SOLN: 250.0000 mL | INTRAVENOUS | Status: DC | PRN

## 2016-10-15 MED ORDER — PANTOPRAZOLE SODIUM 40 MG PO TBEC
40.0000 mg | DELAYED_RELEASE_TABLET | Freq: Every day | ORAL | Status: DC
  Administered 2016-10-15 – 2016-10-20 (×6): 40 mg via ORAL
  Filled 2016-10-15 (×6): qty 1

## 2016-10-15 MED ORDER — MECLIZINE HCL 25 MG PO TABS
25.0000 mg | ORAL_TABLET | Freq: Three times a day (TID) | ORAL | Status: DC | PRN
  Filled 2016-10-15: qty 1

## 2016-10-15 NOTE — Care Management (Addendum)
Patient is admitted with congestive heart failure.  Chronic 02 and followed by [facility].  Patient was referred to outpatient palliative care June 1018 discharge.  Requested [place]

## 2016-10-15 NOTE — Consult Note (Signed)
[place] CONSULT NOTE  Patient ID: [name] MRN: [id number] DOB/AGE: 81/12/1923 81 y.o.  Admit date: 10/14/2016 Referring Physician Dr. Verdell Carmine Primary Physician Dr. Rusty Aus Primary Cardiologist Dr. Saralyn Pilar Reason for Consultation Congestive heart failure  HPI: Patient is a 83 year old female with history of coronary artery disease status post coronary artery bypass grafting with the left internal mammary to the LAD, saphenous vein graft to OM1 in PDA done in February 2014, history of dual-chamber pacemaker for sick sinus syndrome, history of paroxysmal atrial fibrillation, history of hypertension, hyperlipidemia and stage IV any disease as well as aortic valvular disease. She has a history of aortic stenosis which has been calculated at 0.4 cm and May 2018 with a peak gradient of 57 mmHg and a mean gradient 36 mmHg. She has had several recent admissions for chronic systolic congestive heart failure. She usually responds to diuresis very well. At home she is taking 40 mg of furosemide by mouth daily and 20 mg when necessary in the afternoon. She continues to get dyspne ic with activity. She is on adequate prevention with a chads score of 5. She is currently on 2.5 mg of apixiban. She also takes pravastatin 40 mg daily, and potassium chloride. She was admitted with progressive shortness of breath and continues to have severe dyspnea with activity. She has deferred consideration for TAVR. She would be at somewhat increased risk for this.. Her ejection fraction has been 30-35%. She has ruled out for a myocardial infarction.  ROS  Past Medical History:  Diagnosis Date  . Allergic rhinitis   . Anemia   . Anginal pain ([place])   . Coronary artery disease   . GERD (gastroesophageal reflux disease)   . Heart murmur   . Hyperlipidemia   . Hypertension   . Osteoarthritis of hand   . Osteopenia   . Recurrent pleural effusion  on left   . Shortness of breath    "since the heart OR in 05/2012 cause of fluid" (08/17/2012)    Family History  Problem Relation Age of Onset  . Heart disease Mother   . Hypertension Mother   . Cancer Father 29       liver  . Alzheimer's disease Sister   . Hypertension Brother   . Sudden death Brother   . Hypertension Brother 74  . Heart disease Brother   . Hypertension Brother   . Alzheimer's disease Sister     Social History   Social History  . Marital status: Widowed    Spouse name: N/A  . Number of children: N/A  . Years of education: N/A   Occupational History  . retired    Social History Main Topics  . Smoking status: Never Smoker  . Smokeless tobacco: Never Used  . Alcohol use No  . Drug use: No  . Sexual activity: Not Currently   Other Topics Concern  . Not on file   Social History Narrative   Lives alone with 24 hour care givers    Past Surgical History:  Procedure Laterality Date  . BREAST CYST ASPIRATION Left ~ 1948   "benign" (08/17/2012)  . carotid testing negative    . CHEST TUBE INSERTION Left 08/17/2012   Procedure: INSERTION PLEURAL DRAINAGE CATHETER;  Surgeon: Melrose Nakayama, MD;  Location: [facility];  Service: Thoracic;  Laterality: Left;  . COLONOSCOPY  9/02   polyp, hematochezia adenoma due in 5 yrs.  Marland Kitchen  CORONARY ANGIOPLASTY    . CORONARY ARTERY BYPASS GRAFT N/A 06/08/2012   Procedure: CORONARY ARTERY BYPASS GRAFTING (CABG);  Surgeon: Melrose Nakayama, MD;  Location: [facility];  Service: Open Heart Surgery;  Laterality: N/A;  . DEXA OP  03/02  . INSERTION / PLACEMENT PLEURAL CATHETER Left 08/17/2012  . stress Echo negative  7/04  . TEE WITHOUT CARDIOVERSION N/A 06/08/2012   Procedure: TRANSESOPHAGEAL ECHOCARDIOGRAM (TEE);  Surgeon: Melrose Nakayama, MD;  Location: [facility];  Service: Open Heart Surgery;  Laterality: N/A;  . TSpine XRay Scoliosis  11/09/2004   mild thoracic     Prescriptions Prior to Admission  Medication Sig Dispense Refill  Last Dose  . acetaminophen (TYLENOL) 500 MG tablet Take 500 mg by mouth 2 (two) times daily.   unknown at unknown  . ALPRAZolam (XANAX) 0.25 MG tablet Take 0.25 mg by mouth at bedtime.    unknown at unknown  . apixaban (ELIQUIS) 2.5 MG TABS tablet Take 1 tablet by mouth 2 (two) times daily.    unknown at unknown  . calcium carbonate (OSCAL) 1500 (600 Ca) MG TABS tablet Take 600 mg of elemental calcium by mouth 2 (two) times daily with a meal.   unknown at unknown  . ferrous sulfate 325 (65 FE) MG tablet Take 325 mg by mouth daily at 12 noon.   unknown at unknown  . furosemide (LASIX) 40 MG tablet Take 1 tablet (40 mg total) by mouth daily. Can have one additional pill a day as needed, thank you 60 tablet 11 unknown at unknown  . hydroxychloroquine (PLAQUENIL) 200 MG tablet Take 1 tablet by mouth daily.   unknown at unknown  . hydroxypropyl methylcellulose (ISOPTO TEARS) 2.5 % ophthalmic solution Place 1 drop into both eyes 2 (two) times daily as needed (dry eyes).   unknown at unknown  . magnesium hydroxide (MILK OF MAGNESIA) 400 MG/5ML suspension Take 10 mLs by mouth daily as needed for constipation.   unknown at unknown  . magnesium oxide (MAG-OX) 400 (241.3 Mg) MG tablet Take 1 tablet (400 mg total) by mouth daily. 30 tablet 3 unknown at unknown  . metoprolol succinate (TOPROL-XL) 25 MG 24 hr tablet Take 25 mg by mouth daily.   unknown at unknown  . polyethylene glycol powder (GLYCOLAX/MIRALAX) powder    unknown at unknown  . potassium chloride SA (K-DUR,KLOR-CON) 20 MEQ tablet Take 1 tablet (20 mEq total) by mouth daily. 30 tablet 3 unknown at unknown  . vitamin B-12 (CYANOCOBALAMIN) 1000 MCG tablet Take 1,000 mcg by mouth daily.   unknown at unknown    Physical Exam: Blood pressure (!) 106/41, pulse 81, temperature 97.7 F (36.5 C), temperature source Oral, resp. rate 14, height 5\' 3"  (1.6 m), weight 45.2 kg (99 lb 11.2 oz), SpO2 92 %.   Wt Readings from Last 1 Encounters:  10/15/16 45.2  kg (99 lb 11.2 oz)     General appearance: alert and cooperative Resp: rhonchi bibasilar Cardio: regular rate and rhythm and systolic murmur: late systolic 3/6, crescendo and decrescendo at lower left sternal border GI: soft, non-tender; bowel sounds normal; no masses,  no organomegaly Extremities: extremities normal, atraumatic, no cyanosis or edema Neurologic: Grossly normal  Labs:   Lab Results  Component Value Date   WBC 5.7 10/14/2016   HGB 11.4 (L) 10/14/2016   HCT 34.6 (L) 10/14/2016   MCV 91.2 10/14/2016   PLT 180 10/14/2016    Recent Labs Lab 10/14/16 2039 10/15/16 0236  NA 131*  133*  K 4.4 4.0  CL 99* 99*  CO2 23 26  BUN 21* 21*  CREATININE 1.75* 1.84*  CALCIUM 8.6* 8.3*  PROT 7.3  --   BILITOT 0.5  --   ALKPHOS 65  --   ALT 10*  --   AST 23  --   GLUCOSE 108* 99   Lab Results  Component Value Date   CKTOTAL 37 08/14/2013   CKMB 0.9 08/14/2013   TROPONINI 0.03 ([place]) 10/15/2016      Radiology: Cardiomegaly with pulmonary congestion. EKG: Sinus tachycardia  ASSESSMENT AND PLAN:  83 year old female with history of critical aortic stenosis, history of cardiomyopathy, paroxysmal atrial fibrillation, sick sinus syndrome with permanent pacemaker who was admitted with progressive shortness of breath and evidence of mild pulmonary edema on chest x-ray. She has a known ejection fraction of 3035% with critical aortic stenosis with a valve area of 0.4 cm. TE aVR has been discussed in the past and she has deferred this. She would be at somewhat increased risk for this given her LV function, advanced age, renal insufficiency and other comorbid condition. Would carefully diuresis as you are doing. Her troponins are likely multifactorial to include her fixed cardiac output due to her aortic stenosis as well as her cardiomyopathy. Would continue with [facility]. Signed: Teodoro Spray MD, [facility] 10/15/2016, 4:42 PM

## 2016-10-15 NOTE — ED Notes (Signed)
Pt taken off of BIPAP by Dr. Cinda Quest. Pt currently on 4L O2 sats= 97%

## 2016-10-15 NOTE — Progress Notes (Signed)
[facility]  Patient Status: Active  [facility] is providing the following services: SN/OT/HHA  If patient discharges after hours, please call [phone].   [name] 10/15/2016, 2:25 PM

## 2016-10-15 NOTE — Progress Notes (Signed)
6 beat run of Vtach.  Non symptomatics. K is 4.0.  Dr. Verdell Carmine notified.

## 2016-10-15 NOTE — Progress Notes (Signed)
Please note patient has a pending HOME Palliative consult from last admission in June 2018. CMRN Joni Reining made aware. Thank you. Flo Shanks RN, BSN, [facility] [place], [facility] [phone] c

## 2016-10-15 NOTE — Care Management Important Message (Signed)
Important Message  Patient Details  Name: [name] MRN: [id number] Date of Birth: 09/10/1923   Medicare Important Message Given:  Yes Signed IM notice given   Katrina Stack, RN 10/15/2016, 2:52 PM

## 2016-10-15 NOTE — Progress Notes (Signed)
[facility] at [place] NAME: [name]    MR#:  [id number]  DATE OF BIRTH:  March 18, 1924  SUBJECTIVE:   Patient here due to shortness of breath and noted to be in congestive heart failure. Patient says her shortness of breath is improved since yesterday. Being diuresed with IV Lasix. No chest pain, nausea, vomiting.  REVIEW OF SYSTEMS:    Review of Systems  Constitutional: Negative for chills and fever.  HENT: Negative for congestion and tinnitus.   Eyes: Negative for blurred vision and double vision.  Respiratory: Positive for shortness of breath. Negative for cough and wheezing.   Cardiovascular: Negative for chest pain, orthopnea and PND.  Gastrointestinal: Negative for abdominal pain, diarrhea, nausea and vomiting.  Genitourinary: Negative for dysuria and hematuria.  Neurological: Positive for weakness. Negative for dizziness, sensory change and focal weakness.  All other systems reviewed and are negative.   Nutrition: Heart Healthy Tolerating Diet:  yes Tolerating PT: Await Eval.    DRUG ALLERGIES:   Allergies  Allergen Reactions  . Alendronate Sodium Other (See Comments)    REACTION: GI upset  . Simvastatin Other (See Comments)    Feet feel heavy    VITALS:  Blood pressure (!) 106/41, pulse 81, temperature 97.7 F (36.5 C), temperature source Oral, resp. rate 14, height 5\' 3"  (1.6 m), weight 45.2 kg (99 lb 11.2 oz), SpO2 92 %.  PHYSICAL EXAMINATION:   Physical Exam  GENERAL:  83 y.o.-year-old patient lying in bed in no acute distress.  EYES: Pupils equal, round, reactive to light and accommodation. No scleral icterus. Extraocular muscles intact.  HEENT: Head atraumatic, normocephalic. Oropharynx and nasopharynx clear.  NECK:  Supple, no jugular venous distention. No thyroid enlargement, no tenderness.  LUNGS: Normal breath sounds bilaterally, no wheezing, bibasilar rales, No rhonchi. No use of accessory muscles of  respiration.  CARDIOVASCULAR: S1, S2 normal. II/VI SEM at RSB, No rubs, or gallops.  ABDOMEN: Soft, nontender, nondistended. Bowel sounds present. No organomegaly or mass.  EXTREMITIES: No cyanosis, clubbing or edema b/l.    NEUROLOGIC: Cranial nerves II through XII are intact. No focal Motor or sensory deficits b/l.   PSYCHIATRIC: The patient is alert and oriented x 3.  SKIN: No obvious rash, lesion, or ulcer.    LABORATORY PANEL:   CBC  Recent Labs Lab 10/14/16 2039  WBC 5.7  HGB 11.4*  HCT 34.6*  PLT 180   ------------------------------------------------------------------------------------------------------------------  Chemistries   Recent Labs Lab 10/14/16 2039 10/15/16 0236  NA 131* 133*  K 4.4 4.0  CL 99* 99*  CO2 23 26  GLUCOSE 108* 99  BUN 21* 21*  CREATININE 1.75* 1.84*  CALCIUM 8.6* 8.3*  AST 23  --   ALT 10*  --   ALKPHOS 65  --   BILITOT 0.5  --    ------------------------------------------------------------------------------------------------------------------  Cardiac Enzymes  Recent Labs Lab 10/15/16 0835  TROPONINI 0.03*   ------------------------------------------------------------------------------------------------------------------  RADIOLOGY:  Dg Chest Portable 1 View  Result Date: 10/14/2016 CLINICAL DATA:  Acute shortness of breath today. EXAM: PORTABLE CHEST 1 VIEW COMPARISON:  09/17/2016 and prior studies FINDINGS: Cardiomegaly, CABG changes and left-sided pacemaker again noted. An enlarging moderate right pleural effusion noted with right lower lung atelectasis. Scattered atelectasis within the mid and lower left lung again identified. Pulmonary vascular congestion is present. There is no evidence of pneumothorax. IMPRESSION: Enlarging moderate right pleural effusion and right lower lung atelectasis. Cardiomegaly and pulmonary vascular congestion. Electronically Signed  By: Margarette Canada M.D.   On: 10/14/2016 20:27     ASSESSMENT  AND PLAN:   83 year old female with past medical history of hypertension, hyperlipidemia, chronic systolic CHF, aortic stenosis, coronary artery disease, GERD, osteoarthritis who presented to the hospital due to shortness of breath.  1. CHF-acute on chronic systolic dysfunction. This is the cause of patient's worsening shortness of breath and respiratory failure.  -Continue diuresis with IV Lasix, follow I's and O's and daily weights. -Continue metoprolol, enalapril. Await cardiology input.  2. Aortic stenosis-patient's previous echo shows moderate to severe aortic stenosis. -Given advanced age she is likely not a good surgical candidate for aortic valve replacement. We will await further cardiology input. -Continue diuresis and treatment for heart failure as mentioned above.  3. History of paroxysmal atrial fibrillation-currently rate controlled. Continue metoprolol, Eliquis.  4. Anxiety-continue Xanax.  5. History of rheumatoid arthritis-continue Plaquenil.  6. Hypothyroidism-Synthroid.  7. Hyperlipidemia-continue Pravachol.    All the records are reviewed and case discussed with Care Management/Social Worker. Management plans discussed with the patient, family and they are in agreement.  CODE STATUS: Partial Code  DVT Prophylaxis: Eliquis  TOTAL TIME TAKING CARE OF THIS PATIENT: 30 minutes.   POSSIBLE D/C IN 1-2 DAYS, DEPENDING ON CLINICAL CONDITION.   Henreitta Leber M.D on 10/15/2016 at 1:38 PM  Between 7am to 6pm - Pager - [phone]  After 6pm go to [email or web address] - Proofreader  [facility]  Office  [phone]  CC: Primary care physician; Rusty Aus, MD

## 2016-10-16 ENCOUNTER — Inpatient Hospital Stay: Payer: Medicare HMO

## 2016-10-16 LAB — BASIC METABOLIC PANEL
Anion gap: 6 (ref 5–15)
BUN: 21 mg/dL — AB (ref 6–20)
CO2: 28 mmol/L (ref 22–32)
CREATININE: 1.98 mg/dL — AB (ref 0.44–1.00)
Calcium: 8.2 mg/dL — ABNORMAL LOW (ref 8.9–10.3)
Chloride: 102 mmol/L (ref 101–111)
GFR calc Af Amer: 24 mL/min — ABNORMAL LOW (ref >60)
GFR, EST NON AFRICAN AMERICAN: 21 mL/min — AB (ref >60)
GLUCOSE: 86 mg/dL (ref 65–99)
Potassium: 3.6 mmol/L (ref 3.5–5.1)
SODIUM: 136 mmol/L (ref 135–145)

## 2016-10-16 MED ORDER — FUROSEMIDE 10 MG/ML IJ SOLN: 40.0000 mg | Freq: Every day | INTRAMUSCULAR | Status: DC

## 2016-10-16 NOTE — Progress Notes (Signed)
[facility] at [address] NAME: [name]    MR#:  [id number]  DATE OF BIRTH:  1924/02/25  SUBJECTIVE:   Patient here due to shortness of breath and noted to be in congestive heart failure. Shortness of breath much improved since admission. Creatinine trending up and therefore we'll DC IV Lasix for now. No other acute events overnight.  REVIEW OF SYSTEMS:    Review of Systems  Constitutional: Negative for chills and fever.  HENT: Negative for congestion and tinnitus.   Eyes: Negative for blurred vision and double vision.  Respiratory: Positive for shortness of breath. Negative for cough and wheezing.   Cardiovascular: Negative for chest pain, orthopnea and PND.  Gastrointestinal: Negative for abdominal pain, diarrhea, nausea and vomiting.  Genitourinary: Negative for dysuria and hematuria.  Neurological: Positive for weakness. Negative for dizziness, sensory change and focal weakness.  All other systems reviewed and are negative.   Nutrition: Heart Healthy Tolerating Diet:  yes Tolerating PT: Await Eval.    DRUG ALLERGIES:   Allergies  Allergen Reactions  . Alendronate Sodium Other (See Comments)    REACTION: GI upset  . Simvastatin Other (See Comments)    Feet feel heavy    VITALS:  Blood pressure (!) 118/40, pulse 92, temperature (!) 97.4 F (36.3 C), temperature source Oral, resp. rate 16, height 5\' 3"  (1.6 m), weight 44.4 kg (97 lb 12.8 oz), SpO2 100 %.  PHYSICAL EXAMINATION:   Physical Exam  GENERAL:  83 y.o.-year-old patient lying in bed in no acute distress.  EYES: Pupils equal, round, reactive to light and accommodation. No scleral icterus. Extraocular muscles intact.  HEENT: Head atraumatic, normocephalic. Oropharynx and nasopharynx clear.  NECK:  Supple, no jugular venous distention. No thyroid enlargement, no tenderness.  LUNGS: Normal breath sounds bilaterally, no wheezing, bibasilar rales, No rhonchi. No use of  accessory muscles of respiration.  CARDIOVASCULAR: S1, S2 normal. II/VI SEM at RSB, No rubs, or gallops.  ABDOMEN: Soft, nontender, nondistended. Bowel sounds present. No organomegaly or mass.  EXTREMITIES: No cyanosis, clubbing or edema b/l.    NEUROLOGIC: Cranial nerves II through XII are intact. No focal Motor or sensory deficits b/l.   PSYCHIATRIC: The patient is alert and oriented x 3.  SKIN: No obvious rash, lesion, or ulcer.    LABORATORY PANEL:   CBC  Recent Labs Lab 10/14/16 2039  WBC 5.7  HGB 11.4*  HCT 34.6*  PLT 180   ------------------------------------------------------------------------------------------------------------------  Chemistries   Recent Labs Lab 10/14/16 2039  10/16/16 0437  NA 131*  < > 136  K 4.4  < > 3.6  CL 99*  < > 102  CO2 23  < > 28  GLUCOSE 108*  < > 86  BUN 21*  < > 21*  CREATININE 1.75*  < > 1.98*  CALCIUM 8.6*  < > 8.2*  AST 23  --   --   ALT 10*  --   --   ALKPHOS 65  --   --   BILITOT 0.5  --   --   < > = values in this interval not displayed. ------------------------------------------------------------------------------------------------------------------  Cardiac Enzymes  Recent Labs Lab 10/15/16 0835  TROPONINI 0.03*   ------------------------------------------------------------------------------------------------------------------  RADIOLOGY:  Dg Chest Port 1 View  Result Date: 10/16/2016 CLINICAL DATA:  Shortness of breath. EXAM: PORTABLE CHEST 1 VIEW COMPARISON:  10/14/2016 FINDINGS: 1115 hours. The cardio pericardial silhouette is enlarged. Persistent right base collapse/consolidation with right pleural effusion. Retrocardiac  opacity at the left base is stable. Interstitial markings are diffusely coarsened with chronic features. Patient is status post CABG. Left dual lead permanent pacemaker again noted. Telemetry leads overlie the chest. IMPRESSION: 1. Stable exam. Right base collapse/consolidation with right  pleural effusion. 2. Stable retrocardiac density, potentially atelectatic. 3. Vascular congestion without overt edema. Electronically Signed   By: Misty Stanley M.D.   On: 10/16/2016 11:34   Dg Chest Portable 1 View  Result Date: 10/14/2016 CLINICAL DATA:  Acute shortness of breath today. EXAM: PORTABLE CHEST 1 VIEW COMPARISON:  09/17/2016 and prior studies FINDINGS: Cardiomegaly, CABG changes and left-sided pacemaker again noted. An enlarging moderate right pleural effusion noted with right lower lung atelectasis. Scattered atelectasis within the mid and lower left lung again identified. Pulmonary vascular congestion is present. There is no evidence of pneumothorax. IMPRESSION: Enlarging moderate right pleural effusion and right lower lung atelectasis. Cardiomegaly and pulmonary vascular congestion. Electronically Signed   By: Margarette Canada M.D.   On: 10/14/2016 20:27     ASSESSMENT AND PLAN:   83 year old female with past medical history of hypertension, hyperlipidemia, chronic systolic CHF, aortic stenosis, coronary artery disease, GERD, osteoarthritis who presented to the hospital due to shortness of breath.  1. CHF-acute on chronic systolic dysfunction. This is the cause of patient's worsening shortness of breath and respiratory failure.  -Improved with IV diuresis. Creatinine trending up and therefore will DC Lasix for now. Also DC IV Vasotec. -Continue metoprolol, appreciate cardiology input and continue current care. -follow I's and O's and daily weights.  2. Aortic stenosis-patient's previous echo shows moderate to severe aortic stenosis. -Given advanced age she is likely not a good surgical candidate for aortic valve replacement. As per cardiology patient has refused aortic valve replacement in the past. -Continue Vasotec, continue treatment for CHF as mentioned above.  3. Acute on chronic renal failure-secondary to diuresis. Hold Lasix for now -We'll repeat BUN and creatinine  tomorrow.  4. History of paroxysmal atrial fibrillation-currently rate controlled. Continue metoprolol, Eliquis.  5. Anxiety-continue Xanax.  6. History of rheumatoid arthritis-continue Plaquenil.  7. Hypothyroidism-Synthroid.  8. Hyperlipidemia-continue Pravachol.    All the records are reviewed and case discussed with Care Management/Social Worker. Management plans discussed with the patient, family and they are in agreement.  CODE STATUS: Partial Code  DVT Prophylaxis: Eliquis  TOTAL TIME TAKING CARE OF THIS PATIENT: 30 minutes.   POSSIBLE D/C IN 1-2 DAYS, DEPENDING ON CLINICAL CONDITION.   Henreitta Leber M.D on 10/16/2016 at 11:57 AM  Between 7am to 6pm - Pager - [phone]  After 6pm go to [email or web address] - Proofreader  [facility]  Office  [phone]  CC: Primary care physician; Rusty Aus, MD

## 2016-10-16 NOTE — Progress Notes (Signed)
Pt slept throughout the night waking only to use restroom, have labs taken and medications.

## 2016-10-16 NOTE — Progress Notes (Signed)
Pt has deferred consideration for tavr as outpatient in the past. Symtpoms are liiely due to her reduced lv funciton and severe as. Would be a high risk candidate for tavr. Renal funciton worsened somewhat with diuresis. Agree with stopping lasix. Will follow symptoms and renal function. Symptoms have improved

## 2016-10-16 NOTE — Progress Notes (Signed)
Physical Therapy Evaluation Patient Details Name: [name] MRN: [id number] DOB: 09-07-1923 Today's Date: 10/16/2016   History of Present Illness  Patient is a 83 y.o. female admitted on 05 JUL for exacerbation of acute systolic HF due to valvular disease. PMH includes CAD, CHF with EF 30-35%, severe aortic stenosis, GERD, HTN, HLD, OA, and osteopenia.  Clinical Impression  Patient is a pleasant female admitted for above listed reasons. Patient on evaluation demonstrates independence with bed mobility and modified independence with transfers and short ambulation distances with RW. Patient normally on 2L O2 at home and on 3L O2 at time of evaluation, demonstrating O2 saturations >94% and HR between uppers 80-mid 90s. Patient educated about performing shorter, more frequent bouts of activity to improve endurance and prevent exacerbations. Patient expressed understanding. Patient has 24 hour assistance at home at baseline, so she will be safe to return there when medically stable with HHPT f/u to further improve cardiopulmonary endurance and activity tolerance.    Follow Up Recommendations Home health PT    Equipment Recommendations  None recommended by PT    Recommendations for Other Services       Precautions / Restrictions Precautions Precautions: Fall Restrictions Weight Bearing Restrictions: No      Mobility  Bed Mobility Overal bed mobility: Independent             General bed mobility comments: Patient performed bed mobility independently.  Transfers Overall transfer level: Modified independent Equipment used: Rolling walker (2 wheeled)             General transfer comment: Patient moves from sit to stand and stand to sit with good safety awareness.   Ambulation/Gait Ambulation/Gait assistance: Modified independent (Device/Increase time) Ambulation Distance (Feet): 15 Feet Assistive device: Rolling walker (2 wheeled)       General Gait Details: Patient  ambulates at decreased cadence with RW. O2 saturations remained >94% on 3L O2 and HR upper 80-mid 90s.  Stairs            Wheelchair Mobility    Modified Rankin (Stroke Patients Only)       Balance Overall balance assessment: Modified Independent                                           Pertinent Vitals/Pain Pain Assessment: No/denies pain    Home Living Family/patient expects to be discharged to:: Private residence Living Arrangements: Alone;Other (Comment) (Has 24 hour sitter) Available Help at Discharge: Personal care attendant;Available 24 hours/day Type of Home: House Home Access: Level entry     Home Layout: One level Home Equipment: Walker - 4 wheels      Prior Function Level of Independence: Independent with assistive device(s)         Comments: On 2L home O2     Hand Dominance   Dominant Hand: Right    Extremity/Trunk Assessment   Upper Extremity Assessment Upper Extremity Assessment: Overall WFL for tasks assessed    Lower Extremity Assessment Lower Extremity Assessment: Overall WFL for tasks assessed    Cervical / Trunk Assessment Cervical / Trunk Assessment: Normal  Communication   Communication: No difficulties  Cognition Arousal/Alertness: Awake/alert Behavior During Therapy: WFL for tasks assessed/performed Overall Cognitive Status: Within Functional Limits for tasks assessed  General Comments      Exercises     Assessment/Plan    PT Assessment Patient needs continued PT services  PT Problem List Decreased strength;Decreased activity tolerance;Decreased mobility;Cardiopulmonary status limiting activity       PT Treatment Interventions Gait training;Functional mobility training;Therapeutic activities;Therapeutic exercise;Patient/family education    PT Goals (Current goals can be found in the Care Plan section)  Acute Rehab PT Goals Patient Stated  Goal: "To go home" PT Goal Formulation: With patient Time For Goal Achievement: Nov 06, 2016 Potential to Achieve Goals: Good    Frequency Min 2X/week   Barriers to discharge        Co-evaluation               AM-PAC PT "6 Clicks" Daily Activity  Outcome Measure Difficulty turning over in bed (including adjusting bedclothes, sheets and blankets)?: None Difficulty moving from lying on back to sitting on the side of the bed? : None Difficulty sitting down on and standing up from a chair with arms (e.g., wheelchair, bedside commode, etc,.)?: None Help needed moving to and from a bed to chair (including a wheelchair)?: A Little Help needed walking in hospital room?: A Little Help needed climbing 3-5 steps with a railing? : A Little 6 Click Score: 21    End of Session Equipment Utilized During Treatment: Gait belt;Oxygen Activity Tolerance: Patient tolerated treatment well;Patient limited by fatigue Patient left: in bed;with call bell/phone within reach;with bed alarm set Nurse Communication: Mobility status PT Visit Diagnosis: Muscle weakness (generalized) (M62.81);Difficulty in walking, not elsewhere classified (R26.2)    Time: 0600-4599 PT Time Calculation (min) (ACUTE ONLY): 21 min   Charges:   PT Evaluation $PT Eval Low Complexity: 1 Procedure     PT G Codes:          Dorice Lamas, PT, DPT 10/16/2016, 12:24 PM

## 2016-10-17 LAB — BASIC METABOLIC PANEL
ANION GAP: 8 (ref 5–15)
BUN: 23 mg/dL — ABNORMAL HIGH (ref 6–20)
CALCIUM: 8.3 mg/dL — AB (ref 8.9–10.3)
CO2: 26 mmol/L (ref 22–32)
Chloride: 100 mmol/L — ABNORMAL LOW (ref 101–111)
Creatinine, Ser: 2.14 mg/dL — ABNORMAL HIGH (ref 0.44–1.00)
GFR calc Af Amer: 22 mL/min — ABNORMAL LOW (ref >60)
GFR, EST NON AFRICAN AMERICAN: 19 mL/min — AB (ref >60)
Glucose, Bld: 109 mg/dL — ABNORMAL HIGH (ref 65–99)
Potassium: 4.3 mmol/L (ref 3.5–5.1)
SODIUM: 134 mmol/L — AB (ref 135–145)

## 2016-10-17 MED ORDER — ALPRAZOLAM 0.25 MG PO TABS
0.2500 mg | ORAL_TABLET | Freq: Once | ORAL | Status: AC
  Administered 2016-10-17: 0.25 mg via ORAL
  Filled 2016-10-17: qty 1

## 2016-10-17 NOTE — Progress Notes (Signed)
[place] at [place] NAME: [name]    MR#:  [id number]  DATE OF BIRTH:  12-16-1923  SUBJECTIVE:   Patient here due to shortness of breath and noted to be in congestive heart failure. Shortness of breath much improved since admission. Cr. Still trending up but otherwise doing well.   REVIEW OF SYSTEMS:    Review of Systems  Constitutional: Negative for chills and fever.  HENT: Negative for congestion and tinnitus.   Eyes: Negative for blurred vision and double vision.  Respiratory: Positive for shortness of breath. Negative for cough and wheezing.   Cardiovascular: Negative for chest pain, orthopnea and PND.  Gastrointestinal: Negative for abdominal pain, diarrhea, nausea and vomiting.  Genitourinary: Negative for dysuria and hematuria.  Neurological: Positive for weakness. Negative for dizziness, sensory change and focal weakness.  All other systems reviewed and are negative.   Nutrition: Heart Healthy Tolerating Diet:  yes Tolerating PT: Await Eval.    DRUG ALLERGIES:   Allergies  Allergen Reactions  . Alendronate Sodium Other (See Comments)    REACTION: GI upset  . Simvastatin Other (See Comments)    Feet feel heavy    VITALS:  Blood pressure (!) 128/51, pulse 93, temperature 98 F (36.7 C), temperature source Oral, resp. rate 18, height 5\' 3"  (1.6 m), weight 41.8 kg (92 lb 3.2 oz), SpO2 97 %.  PHYSICAL EXAMINATION:   Physical Exam  GENERAL:  83 y.o.-year-old patient lying in bed in no acute distress.  EYES: Pupils equal, round, reactive to light and accommodation. No scleral icterus. Extraocular muscles intact.  HEENT: Head atraumatic, normocephalic. Oropharynx and nasopharynx clear.  NECK:  Supple, no jugular venous distention. No thyroid enlargement, no tenderness.  LUNGS: Normal breath sounds bilaterally, no wheezing, bibasilar rales, No rhonchi. No use of accessory muscles of respiration.  CARDIOVASCULAR: S1, S2  normal. II/VI SEM at RSB, No rubs, or gallops.  ABDOMEN: Soft, nontender, nondistended. Bowel sounds present. No organomegaly or mass.  EXTREMITIES: No cyanosis, clubbing or edema b/l.    NEUROLOGIC: Cranial nerves II through XII are intact. No focal Motor or sensory deficits b/l.   PSYCHIATRIC: The patient is alert and oriented x 3.  SKIN: No obvious rash, lesion, or ulcer.    LABORATORY PANEL:   CBC  Recent Labs Lab 10/14/16 2039  WBC 5.7  HGB 11.4*  HCT 34.6*  PLT 180   ------------------------------------------------------------------------------------------------------------------  Chemistries   Recent Labs Lab 10/14/16 2039  10/17/16 0414  NA 131*  < > 134*  K 4.4  < > 4.3  CL 99*  < > 100*  CO2 23  < > 26  GLUCOSE 108*  < > 109*  BUN 21*  < > 23*  CREATININE 1.75*  < > 2.14*  CALCIUM 8.6*  < > 8.3*  AST 23  --   --   ALT 10*  --   --   ALKPHOS 65  --   --   BILITOT 0.5  --   --   < > = values in this interval not displayed. ------------------------------------------------------------------------------------------------------------------  Cardiac Enzymes  Recent Labs Lab 10/15/16 0835  TROPONINI 0.03*   ------------------------------------------------------------------------------------------------------------------  RADIOLOGY:  Dg Chest Port 1 View  Result Date: 10/16/2016 CLINICAL DATA:  Shortness of breath. EXAM: PORTABLE CHEST 1 VIEW COMPARISON:  10/14/2016 FINDINGS: 1115 hours. The cardio pericardial silhouette is enlarged. Persistent right base collapse/consolidation with right pleural effusion. Retrocardiac opacity at the left base is stable. Interstitial  markings are diffusely coarsened with chronic features. Patient is status post CABG. Left dual lead permanent pacemaker again noted. Telemetry leads overlie the chest. IMPRESSION: 1. Stable exam. Right base collapse/consolidation with right pleural effusion. 2. Stable retrocardiac density,  potentially atelectatic. 3. Vascular congestion without overt edema. Electronically Signed   By: Misty Stanley M.D.   On: 10/16/2016 11:34     ASSESSMENT AND PLAN:   83 year old female with past medical history of hypertension, hyperlipidemia, chronic systolic CHF, aortic stenosis, coronary artery disease, GERD, osteoarthritis who presented to the hospital due to shortness of breath.  1. CHF-acute on chronic systolic dysfunction. This is the cause of patient's worsening shortness of breath and respiratory failure.  -Improved with IV diuresis. Creatinine still trending up and off lasix and vasotec since yesterday -Continue metoprolol, appreciate cardiology input and continue current care. -follow I's and O's and daily weights.  2. Aortic stenosis-patient's previous echo shows moderate to severe aortic stenosis. -Given advanced age she is likely not a good surgical candidate for aortic valve replacement. As per cardiology patient has refused aortic valve replacement in the past. - off vasotec due to ARF continue treatment for CHF as mentioned above.  3. Acute on chronic renal failure-secondary to diuresis. Hold Lasix, Vasotec for now -We'll repeat BUN and creatinine tomorrow.  4. History of paroxysmal atrial fibrillation-currently rate controlled. Continue metoprolol, Eliquis.  5. Anxiety-continue Xanax.  6. History of rheumatoid arthritis-continue Plaquenil.  7. Hypothyroidism-Synthroid.  8. Hyperlipidemia-continue Pravachol.  Possible d/c home tomorrow if Cr. Improving.   All the records are reviewed and case discussed with Care Management/Social Worker. Management plans discussed with the patient, family and they are in agreement.  CODE STATUS: Partial Code  DVT Prophylaxis: Eliquis  TOTAL TIME TAKING CARE OF THIS PATIENT: 25 minutes.   POSSIBLE D/C IN 1-2 DAYS, DEPENDING ON CLINICAL CONDITION.   Henreitta Leber M.D on 10/17/2016 at 12:15 PM  Between 7am to 6pm - Pager -  [phone]  After 6pm go to [email or web address] - Proofreader  [facility]  Office  [phone]  CC: Primary care physician; Rusty Aus, MD

## 2016-10-17 NOTE — Progress Notes (Signed)
[facility]  SUBJECTIVE: States she feels better this am. Denies sob or chest discomfort   Vitals:   10/16/16 0800 10/16/16 1022 10/17/16 0406 10/17/16 0800  BP: (!) 108/43 (!) 118/40 (!) 124/56 (!) 128/51  Pulse: 62 92 86 93  Resp: 16  18 18   Temp: 98.6 F (37 C) (!) 97.4 F (36.3 C) 98.1 F (36.7 C) 98 F (36.7 C)  TempSrc: Oral Oral Oral Oral  SpO2: 96% 100% 99% 97%  Weight:   41.8 kg (92 lb 3.2 oz)   Height:        Intake/Output Summary (Last 24 hours) at 10/17/16 0920 Last data filed at 10/17/16 7824  Gross per 24 hour  Intake                0 ml  Output              200 ml  Net             -200 ml    LABS: Basic Metabolic Panel:  Recent Labs  10/16/16 0437 10/17/16 0414  NA 136 134*  K 3.6 4.3  CL 102 100*  CO2 28 26  GLUCOSE 86 109*  BUN 21* 23*  CREATININE 1.98* 2.14*  CALCIUM 8.2* 8.3*   Liver Function Tests:  Recent Labs  10/14/16 2039  AST 23  ALT 10*  ALKPHOS 65  BILITOT 0.5  PROT 7.3  ALBUMIN 3.3*   No results for input(s): LIPASE, AMYLASE in the last 72 hours. CBC:  Recent Labs  10/14/16 2039  WBC 5.7  NEUTROABS 4.0  HGB 11.4*  HCT 34.6*  MCV 91.2  PLT 180   Cardiac Enzymes:  Recent Labs  10/14/16 2039 10/15/16 0236 10/15/16 0835  TROPONINI 0.03* 0.03* 0.03*   BNP: Invalid input(s): POCBNP D-Dimer: No results for input(s): DDIMER in the last 72 hours. Hemoglobin A1C: No results for input(s): HGBA1C in the last 72 hours. Fasting Lipid Panel: No results for input(s): CHOL, HDL, LDLCALC, TRIG, CHOLHDL, LDLDIRECT in the last 72 hours. Thyroid Function Tests:  Recent Labs  10/15/16 0236  TSH 2.676   Anemia Panel: No results for input(s): VITAMINB12, FOLATE, FERRITIN, TIBC, IRON, RETICCTPCT in the last 72 hours.   Physical Exam: Blood pressure (!) 128/51, pulse 93, temperature 98 F (36.7 C), temperature source Oral, resp. rate 18, height 5\' 3"  (1.6 m),  weight 41.8 kg (92 lb 3.2 oz), SpO2 97 %.   Wt Readings from Last 1 Encounters:  10/17/16 41.8 kg (92 lb 3.2 oz)     General appearance: alert and cooperative Resp: clear to auscultation bilaterally Cardio: regular rate and rhythm and systolic murmur: late systolic 3/6, crescendo and decrescendo at 2nd left intercostal space, at 2nd right intercostal space, at lower left sternal border GI: soft, non-tender; bowel sounds normal; no masses,  no organomegaly Neurologic: Grossly normal  TELEMETRY: Reviewed telemetry pt in nsr:  ASSESSMENT AND PLAN:  Active Problems:   Acute systolic heart failure due to valvular disease (HCC)-clinically better. Symptoms have likely been multi-factoral including critical aortic stenosis, reduced lv function with ef of 30-35%. She feels better after diuresis however, her renal function remains reduced over her base line. She was taken off of diuretic yesterday with creatinine of 2.14 today up from 1.98 yesterday and 1.75 on admission. Discussed increased oral hydration with her this morning. She is anxious to go home. I re-discussed TAVR consideration with her this  morning and she again is adamant about deferring this. I think she would be very high risk for good outcome with TAVR given weight less than 100 pounds, renal insufficiency and advanced age. Will continue with careful intermitent diuresis following renal function, symptoms and for evidence of volume overload.   afib-paroxysmal. Continue with low dose eliquis and follow.     Teodoro Spray, MD, [facility] 10/17/2016 9:20 AM

## 2016-10-18 LAB — BASIC METABOLIC PANEL
ANION GAP: 10 (ref 5–15)
BUN: 29 mg/dL — ABNORMAL HIGH (ref 6–20)
CALCIUM: 8.5 mg/dL — AB (ref 8.9–10.3)
CO2: 23 mmol/L (ref 22–32)
Chloride: 98 mmol/L — ABNORMAL LOW (ref 101–111)
Creatinine, Ser: 2.23 mg/dL — ABNORMAL HIGH (ref 0.44–1.00)
GFR, EST AFRICAN AMERICAN: 21 mL/min — AB (ref >60)
GFR, EST NON AFRICAN AMERICAN: 18 mL/min — AB (ref >60)
GLUCOSE: 87 mg/dL (ref 65–99)
Potassium: 4.9 mmol/L (ref 3.5–5.1)
Sodium: 131 mmol/L — ABNORMAL LOW (ref 135–145)

## 2016-10-18 LAB — CBC
HCT: 36.3 % (ref 35.0–47.0)
HEMOGLOBIN: 12 g/dL (ref 12.0–16.0)
MCH: 30 pg (ref 26.0–34.0)
MCHC: 32.9 g/dL (ref 32.0–36.0)
MCV: 91 fL (ref 80.0–100.0)
Platelets: 172 10*3/uL (ref 150–440)
RBC: 3.99 MIL/uL (ref 3.80–5.20)
RDW: 16.4 % — ABNORMAL HIGH (ref 11.5–14.5)
WBC: 5.6 10*3/uL (ref 3.6–11.0)

## 2016-10-18 MED ORDER — ALPRAZOLAM 0.25 MG PO TABS
0.2500 mg | ORAL_TABLET | Freq: Two times a day (BID) | ORAL | Status: DC | PRN
  Administered 2016-10-19 – 2016-10-20 (×3): 0.25 mg via ORAL
  Filled 2016-10-18 (×3): qty 1

## 2016-10-18 MED ORDER — ALPRAZOLAM 0.25 MG PO TABS
0.2500 mg | ORAL_TABLET | Freq: Once | ORAL | Status: AC
  Administered 2016-10-18: 0.25 mg via ORAL
  Filled 2016-10-18: qty 1

## 2016-10-18 NOTE — Care Management (Signed)
Patient has been adamant with cardiology that she does not wish to have TAVR. Obtained order for palliative care to re consult as decision not to pursue surgery limits treatment options and ability to manage sx.  It is reported during progression that patient gets extremely anxious which affects her respiratory status.  There is concern these sx are not adequately controlled. Obtained order for reconsult by palliative to address goals of care and sx management. In home palliative referral pending from previous admission.  Son did not return call to schedule visit.

## 2016-10-18 NOTE — Progress Notes (Signed)
[place] at [place] NAME: [name]    MR#:  [id number]  DATE OF BIRTH:  1924/03/07  SUBJECTIVE:   Patient here due to shortness of breath and noted to be in congestive heart failure. Shortness of breath much improved since admission. Cr still trending up but otherwise doing well.   Patient sleepy, son at bedside concerned for her anxiety/panic attacks REVIEW OF SYSTEMS:    Review of Systems  Constitutional: Negative for chills and fever.  HENT: Negative for congestion and tinnitus.   Eyes: Negative for blurred vision and double vision.  Respiratory: Positive for shortness of breath. Negative for cough and wheezing.   Cardiovascular: Negative for chest pain, orthopnea and PND.  Gastrointestinal: Negative for abdominal pain, diarrhea, nausea and vomiting.  Genitourinary: Negative for dysuria and hematuria.  Neurological: Positive for weakness. Negative for dizziness, sensory change and focal weakness.  Psychiatric/Behavioral: The patient is nervous/anxious.   All other systems reviewed and are negative.   Nutrition: Heart Healthy Tolerating Diet:  yes Tolerating PT: Await Eval.    DRUG ALLERGIES:   Allergies  Allergen Reactions  . Alendronate Sodium Other (See Comments)    REACTION: GI upset  . Simvastatin Other (See Comments)    Feet feel heavy    VITALS:  Blood pressure (!) 104/27, pulse 76, temperature 98.2 F (36.8 C), resp. rate 18, height 5\' 3"  (1.6 m), weight 41.9 kg (92 lb 4.8 oz), SpO2 98 %.  PHYSICAL EXAMINATION:   Physical Exam  GENERAL:  83 y.o.-year-old patient lying in bed in no acute distress.  EYES: Pupils equal, round, reactive to light and accommodation. No scleral icterus. Extraocular muscles intact.  HEENT: Head atraumatic, normocephalic. Oropharynx and nasopharynx clear.  NECK:  Supple, no jugular venous distention. No thyroid enlargement, no tenderness.  LUNGS: Normal breath sounds bilaterally, no  wheezing, bibasilar rales, No rhonchi. No use of accessory muscles of respiration.  CARDIOVASCULAR: S1, S2 normal. II/VI SEM at RSB, No rubs, or gallops.  ABDOMEN: Soft, nontender, nondistended. Bowel sounds present. No organomegaly or mass.  EXTREMITIES: No cyanosis, clubbing or edema b/l.    NEUROLOGIC: Cranial nerves II through XII are intact. No focal Motor or sensory deficits b/l.   PSYCHIATRIC: The patient is sleepy SKIN: No obvious rash, lesion, or ulcer.  LABORATORY PANEL:   CBC  Recent Labs Lab 10/18/16 0716  WBC 5.6  HGB 12.0  HCT 36.3  PLT 172   ------------------------------------------------------------------------------------------------------------------  Chemistries   Recent Labs Lab 10/14/16 2039  10/18/16 0716  NA 131*  < > 131*  K 4.4  < > 4.9  CL 99*  < > 98*  CO2 23  < > 23  GLUCOSE 108*  < > 87  BUN 21*  < > 29*  CREATININE 1.75*  < > 2.23*  CALCIUM 8.6*  < > 8.5*  AST 23  --   --   ALT 10*  --   --   ALKPHOS 65  --   --   BILITOT 0.5  --   --   < > = values in this interval not displayed. ------------------------------------------------------------------------------------------------------------------  Cardiac Enzymes  Recent Labs Lab 10/15/16 0835  TROPONINI 0.03*   ------------------------------------------------------------------------------------------------------------------  RADIOLOGY:  No results found.   ASSESSMENT AND PLAN:  83 year old female with past medical history of hypertension, hyperlipidemia, chronic systolic CHF, aortic stenosis, coronary artery disease, GERD, osteoarthritis who presented to the hospital due to shortness of breath.  1. CHF-acute on  chronic systolic dysfunction. This is the cause of patient's worsening shortness of breath and respiratory failure.  -Improved with IV diuresis. Creatinine still trending up. off lasix and vasotec  -Continue metoprolol, appreciate cardiology input  -follow I's and O's  and daily weights. - neg 2.2 liters  2. Aortic stenosis-patient's previous echo shows moderate to severe aortic stenosis. -Given advanced age she is likely not a good surgical candidate for aortic valve replacement. As per cardiology patient has refused aortic valve replacement in the past. - off vasotec due to ARF continue treatment for CHF as mentioned above.  3. Acute on chronic renal failure 2-secondary to diuresis. Hold Lasix, Vasotec for now -We'll repeat BUN and creatinine tomorrow. - nephro c/s - may need gentle IVFs  4. History of paroxysmal atrial fibrillation-currently rate controlled. Continue metoprolol, Eliquis.  5. Anxiety-continue Xanax.  6. History of rheumatoid arthritis-continue Plaquenil.  7. Hypothyroidism-Synthroid.  8. Hyperlipidemia-continue Pravachol.  Possible d/c home tomorrow if Cr. Improving.   All the records are reviewed and case discussed with Care Management/Social Worker. Management plans discussed with the patient, family (son at bedside) and they are in agreement.  CODE STATUS: Partial Code, Palliative care c/s  DVT Prophylaxis: Eliquis  TOTAL TIME TAKING CARE OF THIS PATIENT: 25 minutes.   POSSIBLE D/C IN 1-2 DAYS, DEPENDING ON CLINICAL CONDITION.   [name] M.D on 10/18/2016 at 4:24 PM  Between 7am to 6pm - Pager - [phone]  After 6pm go to [email or web address] - Proofreader  [facility]  Office  [phone]  CC: Primary care physician; Hannah Aus, MD

## 2016-10-18 NOTE — Progress Notes (Signed)
PT Cancellation Note  Patient Details Name: [name] MRN: [id number] DOB: May 02, 1923   Cancelled Treatment:    Reason Eval/Treat Not Completed: Other (comment) Pt currently highly lethargic. Her son, Fritz Pickerel, was in the room and reported she had just had a Xanax. SPT attempted bed therex, and pt was unable to participate. Will re-attempt, time permitting.     Bevelyn Ngo 10/18/2016, 9:33 AM

## 2016-10-19 DIAGNOSIS — Z7189 Other specified counseling: Secondary | ICD-10-CM

## 2016-10-19 DIAGNOSIS — Z515 Encounter for palliative care: Secondary | ICD-10-CM

## 2016-10-19 DIAGNOSIS — I38 Endocarditis, valve unspecified: Secondary | ICD-10-CM

## 2016-10-19 DIAGNOSIS — I5021 Acute systolic (congestive) heart failure: Secondary | ICD-10-CM

## 2016-10-19 DIAGNOSIS — I350 Nonrheumatic aortic (valve) stenosis: Secondary | ICD-10-CM

## 2016-10-19 LAB — CBC
HCT: 33.5 % — ABNORMAL LOW (ref 35.0–47.0)
HEMOGLOBIN: 11.1 g/dL — AB (ref 12.0–16.0)
MCH: 29.2 pg (ref 26.0–34.0)
MCHC: 33.2 g/dL (ref 32.0–36.0)
MCV: 88 fL (ref 80.0–100.0)
Platelets: 174 10*3/uL (ref 150–440)
RBC: 3.81 MIL/uL (ref 3.80–5.20)
RDW: 16.9 % — ABNORMAL HIGH (ref 11.5–14.5)
WBC: 9.4 10*3/uL (ref 3.6–11.0)

## 2016-10-19 LAB — BASIC METABOLIC PANEL
ANION GAP: 12 (ref 5–15)
BUN: 41 mg/dL — ABNORMAL HIGH (ref 6–20)
CALCIUM: 8.3 mg/dL — AB (ref 8.9–10.3)
CO2: 22 mmol/L (ref 22–32)
Chloride: 98 mmol/L — ABNORMAL LOW (ref 101–111)
Creatinine, Ser: 2.76 mg/dL — ABNORMAL HIGH (ref 0.44–1.00)
GFR, EST AFRICAN AMERICAN: 16 mL/min — AB (ref >60)
GFR, EST NON AFRICAN AMERICAN: 14 mL/min — AB (ref >60)
Glucose, Bld: 65 mg/dL (ref 65–99)
Potassium: 5.5 mmol/L — ABNORMAL HIGH (ref 3.5–5.1)
Sodium: 132 mmol/L — ABNORMAL LOW (ref 135–145)

## 2016-10-19 MED ORDER — FENTANYL CITRATE (PF) 100 MCG/2ML IJ SOLN: 12.5000 ug | INTRAMUSCULAR | Status: DC | PRN

## 2016-10-19 MED ORDER — SODIUM CHLORIDE 0.9 % IV SOLN
INTRAVENOUS | Status: AC
  Administered 2016-10-19: 11:00:00 via INTRAVENOUS

## 2016-10-19 MED ORDER — NAPHAZOLINE-GLYCERIN 0.012-0.2 % OP SOLN
1.0000 [drp] | Freq: Two times a day (BID) | OPHTHALMIC | Status: DC
  Filled 2016-10-19: qty 15

## 2016-10-19 MED ORDER — NAPHAZOLINE-GLYCERIN 0.012-0.25 % OP SOLN
1.0000 [drp] | Freq: Two times a day (BID) | OPHTHALMIC | Status: DC
  Administered 2016-10-19 – 2016-10-20 (×2): 1 [drp] via OPHTHALMIC
  Filled 2016-10-19 (×3): qty 30

## 2016-10-19 NOTE — Progress Notes (Signed)
Physical Therapy Treatment Patient Details Name: [name] MRN: [id number] DOB: 1924-02-13 Today's Date: 10/19/2016    History of Present Illness Patient is a 83 y.o. female admitted on 05 JUL for exacerbation of acute systolic HF due to valvular disease. PMH includes CAD, CHF with EF 30-35%, severe aortic stenosis, GERD, HTN, HLD, OA, and osteopenia.    PT Comments    Pt with K+ values of 5.5 and pending nephrology consult; discussed these values with nursing prior to session and was cleared to treat. Bed mobility, tranfers, and ambulation deferred this session as precaution, due to lab values. Supine therex in bed performed at supervision level, with min verbal cues to perform exercises slowely to improve quality. Therex performed on 3L supplemental O2 with no signs/sx/SOB/fatigue noted. Pt education on cont breathing during therex repetitions. Pt presents with the following deficits:  strength and endurance. Overall, pt responded well to today's treatment with no adverse affects. Pt would benefit from skilled PT to address the previously mentioned impairments and promote return to PLOF. Currently recommending HHPT, pending d/c.    Follow Up Recommendations  Home health PT     Equipment Recommendations  None recommended by PT    Recommendations for Other Services       Precautions / Restrictions Precautions Precautions: Fall Restrictions Weight Bearing Restrictions: No    Mobility  Bed Mobility               General bed mobility comments: Not performed. Bed level therex only, as precautionary due to K+ of 5.5 and pending nephrology consult.  Transfers                 General transfer comment: Not performed. Bed level therex only, as precautionary due to K+ of 5.5 and pending nephrology consult.  Ambulation/Gait             General Gait Details: Not performed. Bed level therex only, as precautionary due to K+ of 5.5 and pending nephrology  consult.   Stairs            Wheelchair Mobility    Modified Rankin (Stroke Patients Only)       Balance                                            Cognition Arousal/Alertness: Awake/alert Behavior During Therapy: WFL for tasks assessed/performed Overall Cognitive Status: Within Functional Limits for tasks assessed                                 General Comments: Slightly irritable, but willing to participate.       Exercises Other Exercises Other Exercises: Supine therex performed to B LE's/UE's with supervision x10 reps: ankle pumps, quad sets, glute sets, heel slides, SLR, hip abd, and shoulder flex. Pt education re: continue breathing during repititions of therex; exercise assists in pumping blood throughout the body. Encouraged pt to perform supine therex in bed throughout the day for circulatory purposes. Pt verbalized understanding. Performed therex on 3L supplemental O2.    General Comments        Pertinent Vitals/Pain Pain Assessment: No/denies pain    Home Living                      Prior Function  PT Goals (current goals can now be found in the care plan section) Acute Rehab PT Goals Patient Stated Goal: "To go home" PT Goal Formulation: With patient Time For Goal Achievement: 11-03-16 Potential to Achieve Goals: Good Progress towards PT goals: Progressing toward goals    Frequency    Min 2X/week      PT Plan Current plan remains appropriate    Co-evaluation              AM-PAC PT "6 Clicks" Daily Activity  Outcome Measure  Difficulty turning over in bed (including adjusting bedclothes, sheets and blankets)?: None Difficulty moving from lying on back to sitting on the side of the bed? : None Difficulty sitting down on and standing up from a chair with arms (e.g., wheelchair, bedside commode, etc,.)?: None Help needed moving to and from a bed to chair (including a  wheelchair)?: A Little Help needed walking in hospital room?: A Little Help needed climbing 3-5 steps with a railing? : A Little 6 Click Score: 21    End of Session Equipment Utilized During Treatment: Gait belt;Oxygen Activity Tolerance: Patient tolerated treatment well Patient left: in bed;with call bell/phone within reach;with bed alarm set;with family/visitor present (Caregiver in room ) Nurse Communication: Mobility status PT Visit Diagnosis: Muscle weakness (generalized) (M62.81);Difficulty in walking, not elsewhere classified (R26.2)     Time: 4081-4481 PT Time Calculation (min) (ACUTE ONLY): 10 min  Charges:                       G Codes:       Oran Rein PT, SPT   Bevelyn Ngo 10/19/2016, 11:12 AM

## 2016-10-19 NOTE — Consult Note (Signed)
Date: 10/19/2016                  Patient Name:  [name]  MRN: [id number]  DOB: December 23, 1923  Age / Sex: 83 y.o., female         PCP: Rusty Aus, MD                 Service Requesting Consult: Hospitalist/ Max Sane, MD                 Reason for Consult: ARF            History of Present Illness: Patient is a 83 y.o. female who was admitted to [facility] on 10/14/2016 . Patient is a 83 year old Caucasian female with severe congestive heart failure with EF of 30-35%, severe aortic stenosis with valve area of 0.4, atrial fibrillation, coronary disease, hypertension, hyperlipidemia who is admitted with congestive heart failure exacerbation.  Nephrology consult is requested for acute renal failure on chronic kidney disease Her baseline creatinine appears to be 1.5 from June 10, corresponding to GFR of 28 Since then, it has progressively worsened. Creatinine has peaked today at 2176 Potassium level has increased to 5.5    Medications: Outpatient medications: Prescriptions Prior to Admission  Medication Sig Dispense Refill Last Dose  . acetaminophen (TYLENOL) 500 MG tablet Take 500 mg by mouth 2 (two) times daily.   unknown at unknown  . ALPRAZolam (XANAX) 0.25 MG tablet Take 0.25 mg by mouth at bedtime.    unknown at unknown  . apixaban (ELIQUIS) 2.5 MG TABS tablet Take 1 tablet by mouth 2 (two) times daily.    unknown at unknown  . calcium carbonate (OSCAL) 1500 (600 Ca) MG TABS tablet Take 600 mg of elemental calcium by mouth 2 (two) times daily with a meal.   unknown at unknown  . ferrous sulfate 325 (65 FE) MG tablet Take 325 mg by mouth daily at 12 noon.   unknown at unknown  . furosemide (LASIX) 40 MG tablet Take 1 tablet (40 mg total) by mouth daily. Can have one additional pill a day as needed, thank you 60 tablet 11 unknown at unknown  . hydroxychloroquine (PLAQUENIL) 200 MG tablet Take 1 tablet by mouth daily.   unknown at unknown  . hydroxypropyl methylcellulose (ISOPTO  TEARS) 2.5 % ophthalmic solution Place 1 drop into both eyes 2 (two) times daily as needed (dry eyes).   unknown at unknown  . magnesium hydroxide (MILK OF MAGNESIA) 400 MG/5ML suspension Take 10 mLs by mouth daily as needed for constipation.   unknown at unknown  . magnesium oxide (MAG-OX) 400 (241.3 Mg) MG tablet Take 1 tablet (400 mg total) by mouth daily. 30 tablet 3 unknown at unknown  . metoprolol succinate (TOPROL-XL) 25 MG 24 hr tablet Take 25 mg by mouth daily.   unknown at unknown  . polyethylene glycol powder (GLYCOLAX/MIRALAX) powder    unknown at unknown  . potassium chloride SA (K-DUR,KLOR-CON) 20 MEQ tablet Take 1 tablet (20 mEq total) by mouth daily. 30 tablet 3 unknown at unknown  . vitamin B-12 (CYANOCOBALAMIN) 1000 MCG tablet Take 1,000 mcg by mouth daily.   unknown at unknown    Current medications: Current Facility-Administered Medications  Medication Dose Route Frequency Provider Last Rate Last Dose  . 0.9 %  sodium chloride infusion  250 mL Intravenous PRN Hugelmeyer, Alexis, DO      . 0.9 %  sodium chloride infusion   Intravenous Continuous  Murlean Iba, MD 50 mL/hr at 10/19/16 1100    . acetaminophen (TYLENOL) tablet 500 mg  500 mg Oral BID Hugelmeyer, Alexis, DO   500 mg at 10/19/16 0836  . acetaminophen (TYLENOL) tablet 650 mg  650 mg Oral Q4H PRN Hugelmeyer, Alexis, DO   650 mg at 10/15/16 0307  . ALPRAZolam Duanne Moron) tablet 0.25 mg  0.25 mg Oral QHS Hugelmeyer, Alexis, DO   0.25 mg at 10/18/16 2124  . ALPRAZolam Duanne Moron) tablet 0.25 mg  0.25 mg Oral BID PRN Max Sane, MD   0.25 mg at 10/19/16 1234  . apixaban (ELIQUIS) tablet 2.5 mg  2.5 mg Oral Daily Hugelmeyer, Alexis, DO   2.5 mg at 10/19/16 0836  . calcium carbonate (TUMS - dosed in mg elemental calcium) chewable tablet 600 mg of elemental calcium  600 mg of elemental calcium Oral BID WC Henreitta Leber, MD   600 mg of elemental calcium at 10/19/16 1615  . cycloSPORINE (RESTASIS) 0.05 % ophthalmic emulsion 1  drop  1 drop Both Eyes BID Hugelmeyer, Alexis, DO   1 drop at 10/19/16 0835  . fentaNYL (SUBLIMAZE) injection 12.5 mcg  12.5 mcg Intravenous Q2H PRN York, Marianne L, PA-C      . ferrous sulfate tablet 325 mg  325 mg Oral Q1200 Hugelmeyer, Alexis, DO   325 mg at 10/19/16 0835  . hydroxychloroquine (PLAQUENIL) tablet 200 mg  200 mg Oral Daily Hugelmeyer, Alexis, DO   200 mg at 10/19/16 0836  . levothyroxine (SYNTHROID, LEVOTHROID) tablet 50 mcg  50 mcg Oral QAC breakfast Hugelmeyer, Alexis, DO   50 mcg at 10/19/16 6073  . magnesium oxide (MAG-OX) tablet 400 mg  400 mg Oral Daily Hugelmeyer, Alexis, DO   400 mg at 10/19/16 0837  . meclizine (ANTIVERT) tablet 25 mg  25 mg Oral TID PRN Hugelmeyer, Alexis, DO      . metoprolol succinate (TOPROL-XL) 24 hr tablet 12.5 mg  12.5 mg Oral Daily Hugelmeyer, Alexis, DO   12.5 mg at 10/19/16 0836  . Naphazoline-Glycerin 0.012-0.25 % SOLN 1-2 drop  1-2 drop Both Eyes BID Max Sane, MD      . ondansetron (ZOFRAN) injection 4 mg  4 mg Intravenous Q6H PRN Hugelmeyer, Alexis, DO      . pantoprazole (PROTONIX) EC tablet 40 mg  40 mg Oral Daily Hugelmeyer, Alexis, DO   40 mg at 10/19/16 0837  . polyvinyl alcohol (LIQUIFILM TEARS) 1.4 % ophthalmic solution 1 drop  1 drop Both Eyes BID PRN Hugelmeyer, Alexis, DO      . pravastatin (PRAVACHOL) tablet 40 mg  40 mg Oral QHS Hugelmeyer, Alexis, DO   40 mg at 10/18/16 2124  . sodium chloride flush (NS) 0.9 % injection 3 mL  3 mL Intravenous Q12H Hugelmeyer, Alexis, DO   3 mL at 10/19/16 7106  . sodium chloride flush (NS) 0.9 % injection 3 mL  3 mL Intravenous PRN Hugelmeyer, Alexis, DO      . vitamin B-12 (CYANOCOBALAMIN) tablet 1,000 mcg  1,000 mcg Oral Daily Hugelmeyer, Alexis, DO   1,000 mcg at 10/19/16 0836      Allergies: Allergies  Allergen Reactions  . Alendronate Sodium Other (See Comments)    REACTION: GI upset  . Simvastatin Other (See Comments)    Feet feel heavy      Past Medical History: Past  Medical History:  Diagnosis Date  . Allergic rhinitis   . Anemia   . Anginal pain ([place])   . Coronary artery  disease   . GERD (gastroesophageal reflux disease)   . Heart murmur   . Hyperlipidemia   . Hypertension   . Osteoarthritis of hand   . Osteopenia   . Recurrent pleural effusion on left   . Shortness of breath    "since the heart OR in 05/2012 cause of fluid" (08/17/2012)     Past Surgical History: Past Surgical History:  Procedure Laterality Date  . BREAST CYST ASPIRATION Left ~ 1948   "benign" (08/17/2012)  . carotid testing negative    . CHEST TUBE INSERTION Left 08/17/2012   Procedure: INSERTION PLEURAL DRAINAGE CATHETER;  Surgeon: Melrose Nakayama, MD;  Location: [place];  Service: Thoracic;  Laterality: Left;  . COLONOSCOPY  9/02   polyp, hematochezia adenoma due in 5 yrs.  . CORONARY ANGIOPLASTY    . CORONARY ARTERY BYPASS GRAFT N/A 06/08/2012   Procedure: CORONARY ARTERY BYPASS GRAFTING (CABG);  Surgeon: Melrose Nakayama, MD;  Location: [place];  Service: Open Heart Surgery;  Laterality: N/A;  . DEXA OP  03/02  . INSERTION / PLACEMENT PLEURAL CATHETER Left 08/17/2012  . stress Echo negative  7/04  . TEE WITHOUT CARDIOVERSION N/A 06/08/2012   Procedure: TRANSESOPHAGEAL ECHOCARDIOGRAM (TEE);  Surgeon: Melrose Nakayama, MD;  Location: [place];  Service: Open Heart Surgery;  Laterality: N/A;  . TSpine XRay Scoliosis  11/09/2004   mild thoracic     Family History: Family History  Problem Relation Age of Onset  . Heart disease Mother   . Hypertension Mother   . Cancer Father 61       liver  . Alzheimer's disease Sister   . Hypertension Brother   . Sudden death Brother   . Hypertension Brother 51  . Heart disease Brother   . Hypertension Brother   . Alzheimer's disease Sister      Social History: Social History   Social History  . Marital status: Widowed    Spouse name: N/A  . Number of children: N/A  . Years of education: N/A   Occupational History   . retired    Social History Main Topics  . Smoking status: Never Smoker  . Smokeless tobacco: Never Used  . Alcohol use No  . Drug use: No  . Sexual activity: Not Currently   Other Topics Concern  . Not on file   Social History Narrative   Lives alone with 24 hour care givers     Review of Systems: Gen:  Denies any fevers or chills HEENT: denies vision or hearing problems at present CV: shortness of breath, dyspnea on exertion, Resp: some cough, no sputum RJ:JOACZYSA is very poor, denies diarrhea, constipation or blood in the stool GU : denies any problems MS: very weak, has been unable to get around much Derm:  no acute complaints Psych:no complaints Heme: no complaints Neuro: no complaints Endocrine. no complaints  Vital Signs: Blood pressure (!) 116/49, pulse 75, temperature (!) 97.5 F (36.4 C), resp. rate 18, height 5\' 3"  (1.6 m), weight 41.1 kg (90 lb 9.6 oz), SpO2 97 %.   Intake/Output Summary (Last 24 hours) at 10/19/16 1616 Last data filed at 10/19/16 1410  Gross per 24 hour  Intake              480 ml  Output                0 ml  Net              480  ml    Weight trends: Filed Weights   10/17/16 0406 10/18/16 0513 10/19/16 0511  Weight: 41.8 kg (92 lb 3.2 oz) 41.9 kg (92 lb 4.8 oz) 41.1 kg (90 lb 9.6 oz)    Physical Exam: General: frail, thin, lying in the bed  HEENT Anicteric, dry oral mucous membranes  Neck:  supple  Lungs: Bibasilar mild crackles,   Heart:: Irregular, prominent crescendo systolic murmur  Abdomen: Soft, nontender  Extremities:  no edema  Neurologic: Alert, oriented, able to answer questions  Skin: Decreased turgor, no acute rashes             Lab results: Basic Metabolic Panel:  Recent Labs Lab 10/17/16 0414 10/18/16 0716 10/19/16 0453  NA 134* 131* 132*  K 4.3 4.9 5.5*  CL 100* 98* 98*  CO2 26 23 22   GLUCOSE 109* 87 65  BUN 23* 29* 41*  CREATININE 2.14* 2.23* 2.76*  CALCIUM 8.3* 8.5* 8.3*    Liver  Function Tests:  Recent Labs Lab 10/14/16 2039  AST 23  ALT 10*  ALKPHOS 65  BILITOT 0.5  PROT 7.3  ALBUMIN 3.3*   No results for input(s): LIPASE, AMYLASE in the last 168 hours. No results for input(s): AMMONIA in the last 168 hours.  CBC:  Recent Labs Lab 10/14/16 2039 10/18/16 0716 10/19/16 0453  WBC 5.7 5.6 9.4  NEUTROABS 4.0  --   --   HGB 11.4* 12.0 11.1*  HCT 34.6* 36.3 33.5*  MCV 91.2 91.0 88.0  PLT 180 172 174    Cardiac Enzymes:  Recent Labs Lab 10/15/16 0835  TROPONINI 0.03*    BNP: Invalid input(s): POCBNP  CBG: No results for input(s): GLUCAP in the last 168 hours.  Microbiology: No results found for this or any previous visit (from the past 720 hour(s)).   Coagulation Studies: No results for input(s): LABPROT, INR in the last 72 hours.  Urinalysis: No results for input(s): COLORURINE, LABSPEC, PHURINE, GLUCOSEU, HGBUR, BILIRUBINUR, KETONESUR, PROTEINUR, UROBILINOGEN, NITRITE, LEUKOCYTESUR in the last 72 hours.  Invalid input(s): APPERANCEUR      Imaging:  No results found.   Assessment & Plan: Pt is a 83 y.o.   female admitted on 10/14/2016  Patient is a 83 year old Caucasian female with severe congestive heart failure with EF of 30-35%, severe aortic stenosis with valve area of 0.4, atrial fibrillation, coronary disease, hypertension, hyperlipidemia who is admitted with congestive heart failure exacerbation.  1.  Acute renal failure, chronic kidney disease stage IV.  Baseline 1.5/GFR 28 Patient has progressively worsening creatinine.  It is likely secondary to cardiorenal syndrome.  Patient has severe aortic stenosis which is likely limiting her cardiac output. Patient appears to be somewhat intravascularly dry therefore she is getting low dose of IV normal saline at 50 cc per hour.  In addition, her oral intake is very poor. - Encourage oral intake - Discontinue IV fluids after the current bag - avoid hypotension  2.   Hyperkalemia - Discontinue potassium chloride supplementation - Monitor closely . 3.  Severe chronic systolic congestive heart failure with severe aortic stenosis Patient has been evaluated by Dr. Ubaldo Glassing. Discussions for TAVR procedure are ongoing but she is considered to be high risk

## 2016-10-19 NOTE — Consult Note (Signed)
Consultation Note Date: 10/19/2016   Patient Name: [name]  DOB: 10/21/1923  MRN: [id number]  Age / Sex: 83 y.o., female  PCP: Rusty Aus, MD Referring Physician: Max Sane, MD  Reason for Consultation: Establishing goals of care  HPI/Patient Profile: 83 y.o. female with past medical history of systolic congestive heart failure with an EF of 30-35% (Echo done 08/15/2016) , severe aortic stenosis, A. Fib, coronary artery disease,  HTN, HLD who was admitted on 10/14/2016 with congestive heart failure exacerbation. The patient has been admitted 3 times in the past 6 months with the most recent admission being 09/17/2016 with a congestive heart failure exacerbation.   The patient is currently a partial code, allowing for intubation, use of NIPPV/BiPap, and administration of ACLS medications. The patient does not desire to have CPR, defibrillation or cardioversion. Albumin on 10/14/2016 was 3.3. Creatinine level has been increasing from 1.84 on 10/15/2016 to 2.76 on 10/19/2016.   Clinical Assessment and Goals of Care:  I have reviewed medical records including EPIC notes, labs and imaging, received report from nursing, assessed the patient and then met at the bedside along with home aide  to discuss diagnosis prognosis, GOC, EOL wishes, disposition and options.  I introduced Palliative Medicine as specialized medical care for people living with serious illness. It focuses on providing relief from the symptoms and stress of a serious illness. The goal is to improve quality of life for both the patient and the family.  Mrs Moncrief reports that her SOB is decreasing but that she is feeling "bad" with regard to news of her not going home due to her decreasing kidney function.     We discussed a brief life review of the patient. She was an employee at [employer] for 15 years and has one son who is a Artist.   Then I assessed functional and nutritional status at home by gathering history. She is non abulatory and has 24 hour help at home. She receives assistance with ADLs (bathing, dressing).  We discussed her current illness and what it means in the larger context of her on-going co-morbidities. Specifically severe aortic stenosis, for which she has decided not to pursue surgical intervention. She is aware that her symptoms of severe dyspnea will continue without surgical care. We dicussed that hospice could aide in managing her symptoms at home without hospitalization or doctor's office visits.  Natural disease trajectory and expectations at EOL were discussed.  Hospice and Palliative Care services outpatient were explained and offered.  Questions and concerns were addressed. The family was encouraged to call with questions or concerns.  PMT will continue to support holistically.  Primary Decision Maker:  PATIENT. Son will be her surrogate if needed.     SUMMARY OF RECOMMENDATIONS  Will continue to follow in regard to hospice and code status.  Per Mrs. Stemen her son will come to the hospital at 3:30 today - we will attempt to meet him and discuss hospice services.  Code Status/Advance Care  Planning:  Limited code    Symptom Management:   Per primary team. Start eye drops for allergies. PRN fentanyl for dyspnea.   Additional Recommendations (Limitations, Scope, Preferences):  Minimize Medications and No Surgical Procedures   Prognosis:   < 6 months given immobility, severe AS with CHF, immobility, limited PO intake.  Discharge Planning: To Be Determined. Will discuss the option of home hospice care with son, Fritz Pickerel.       Primary Diagnoses: Present on Admission: . Acute systolic heart failure due to valvular disease ([place])   I have reviewed the medical record, interviewed the patient and family, and examined the patient. The following aspects are pertinent.  Past Medical History:    Diagnosis Date  . Allergic rhinitis   . Anemia   . Anginal pain ([place])   . Coronary artery disease   . GERD (gastroesophageal reflux disease)   . Heart murmur   . Hyperlipidemia   . Hypertension   . Osteoarthritis of hand   . Osteopenia   . Recurrent pleural effusion on left   . Shortness of breath    "since the heart OR in 05/2012 cause of fluid" (08/17/2012)   Social History   Social History  . Marital status: Widowed    Spouse name: N/A  . Number of children: N/A  . Years of education: N/A   Occupational History  . retired    Social History Main Topics  . Smoking status: Never Smoker  . Smokeless tobacco: Never Used  . Alcohol use No  . Drug use: No  . Sexual activity: Not Currently   Other Topics Concern  . None   Social History Narrative   Lives alone with 24 hour care givers   Family History  Problem Relation Age of Onset  . Heart disease Mother   . Hypertension Mother   . Cancer Father 3       liver  . Alzheimer's disease Sister   . Hypertension Brother   . Sudden death Brother   . Hypertension Brother 61  . Heart disease Brother   . Hypertension Brother   . Alzheimer's disease Sister    Scheduled Meds: . acetaminophen  500 mg Oral BID  . ALPRAZolam  0.25 mg Oral QHS  . apixaban  2.5 mg Oral Daily  . calcium carbonate  600 mg of elemental calcium Oral BID WC  . cycloSPORINE  1 drop Both Eyes BID  . ferrous sulfate  325 mg Oral Q1200  . hydroxychloroquine  200 mg Oral Daily  . levothyroxine  50 mcg Oral QAC breakfast  . magnesium oxide  400 mg Oral Daily  . metoprolol succinate  12.5 mg Oral Daily  . pantoprazole  40 mg Oral Daily  . pravastatin  40 mg Oral QHS  . sodium chloride flush  3 mL Intravenous Q12H  . vitamin B-12  1,000 mcg Oral Daily   Continuous Infusions: . sodium chloride    . sodium chloride 50 mL/hr at 10/19/16 1100   PRN Meds:.sodium chloride, acetaminophen, ALPRAZolam, meclizine, ondansetron (ZOFRAN) IV, polyvinyl  alcohol, sodium chloride flush Allergies  Allergen Reactions  . Alendronate Sodium Other (See Comments)    REACTION: GI upset  . Simvastatin Other (See Comments)    Feet feel heavy   Review of Systems 10 sys. Patient admits to exertional SOB and associated anxiety.   Physical Exam Patient appears to be relaxing comfortably in bed. Patient is complaining of discomfort caused by the IV and having the keep  her right arm straight. 3/6 murmur. Peripheral edema absent. Lungs clear bilaterally. Abdomen non tender. Bowel sounds presents.   Vital Signs: BP (!) 135/43 (BP Location: Right Arm)   Pulse 76   Temp (!) 97.5 F (36.4 C) (Oral)   Resp 20   Ht _0  (1.6 m)   Wt 41.1 kg (90 lb 9.6 oz)   SpO2 100%   BMI 16.05 kg/m  Pain Assessment: No/denies pain POSS *See Group Information*: 1-Acceptable,Awake and alert Pain Score: 0-No pain   SpO2: SpO2: 100 % O2 Device:SpO2: 100 % O2 Flow Rate: .O2 Flow Rate (L/min): 3 L/min  IO: Intake/output summary:  Intake/Output Summary (Last 24 hours) at 10/19/16 1351 Last data filed at 10/19/16 0925  Gross per 24 hour  Intake              240 ml  Output                0 ml  Net              240 ml    LBM: Last BM Date: 10/19/16 Baseline Weight: Weight: 44 kg (97 lb) Most recent weight: Weight: 41.1 kg (90 lb 9.6 oz)     Palliative Assessment/Data:     Time In: 2:30 Time Out: 3:40 Time Total: 70 min Greater than 50%  of this time was spent counseling and coordinating care related to the above assessment and plan.  Signed by: Patsy Baltimore, PA-C Palliative Medicine Pager: [phone]  Please contact Palliative Medicine Team phone at [phone] for questions and concerns.  For individual provider: See Shea Evans

## 2016-10-19 NOTE — Progress Notes (Signed)
Family Meeting Note  Advance Directive:no  Today a meeting took place with the Patient and Daughter.  following clinical team members were present during this meeting:MD  The following were discussed:Patient's diagnosis: , Patient's progosis: < 6 months and Goals for treatment: Partial Code  Additional follow-up to be provided: Palliative care c/s.  She may be appropriate for hospice  Time spent during discussion:20 minutes  Max Sane, MD

## 2016-10-19 NOTE — Progress Notes (Signed)
[facility] NAME: [name]    MR#:  [id number]  DATE OF BIRTH:  Dec 23, 1923  SUBJECTIVE:   Patient here due to shortness of breath and noted to be in congestive heart failure. Shortness of breath much improved since admission. Cr still trending up but otherwise doing well.   Patient sleepy, son at bedside concerned for her anxiety/panic attacks REVIEW OF SYSTEMS:    Review of Systems  Constitutional: Negative for chills and fever.  HENT: Negative for congestion and tinnitus.   Eyes: Negative for blurred vision and double vision.  Respiratory: Positive for shortness of breath. Negative for cough and wheezing.   Cardiovascular: Negative for chest pain, orthopnea and PND.  Gastrointestinal: Negative for abdominal pain, diarrhea, nausea and vomiting.  Genitourinary: Negative for dysuria and hematuria.  Neurological: Positive for weakness. Negative for dizziness, sensory change and focal weakness.  Psychiatric/Behavioral: The patient is nervous/anxious.   All other systems reviewed and are negative.   Nutrition: Heart Healthy Tolerating Diet:  yes Tolerating PT: Await Eval.    DRUG ALLERGIES:   Allergies  Allergen Reactions  . Alendronate Sodium Other (See Comments)    REACTION: GI upset  . Simvastatin Other (See Comments)    Feet feel heavy    VITALS:  Blood pressure (!) 135/43, pulse 76, temperature (!) 97.5 F (36.4 C), temperature source Oral, resp. rate 20, height 5\' 3"  (1.6 m), weight 41.1 kg (90 lb 9.6 oz), SpO2 100 %.  PHYSICAL EXAMINATION:   Physical Exam  GENERAL:  83 y.o.-year-old patient lying in bed in no acute distress.  EYES: Pupils equal, round, reactive to light and accommodation. No scleral icterus. Extraocular muscles intact.  HEENT: Head atraumatic, normocephalic. Oropharynx and nasopharynx clear.  NECK:  Supple, no jugular venous distention. No thyroid enlargement, no tenderness.  LUNGS: Normal  breath sounds bilaterally, no wheezing, bibasilar rales, No rhonchi. No use of accessory muscles of respiration.  CARDIOVASCULAR: S1, S2 normal. II/VI SEM at RSB, No rubs, or gallops.  ABDOMEN: Soft, nontender, nondistended. Bowel sounds present. No organomegaly or mass.  EXTREMITIES: No cyanosis, clubbing or edema b/l.    NEUROLOGIC: Cranial nerves II through XII are intact. No focal Motor or sensory deficits b/l.   PSYCHIATRIC: The patient is sleepy SKIN: No obvious rash, lesion, or ulcer.  LABORATORY PANEL:   CBC  Recent Labs Lab 10/19/16 0453  WBC 9.4  HGB 11.1*  HCT 33.5*  PLT 174   ------------------------------------------------------------------------------------------------------------------  Chemistries   Recent Labs Lab 10/14/16 2039  10/19/16 0453  NA 131*  < > 132*  K 4.4  < > 5.5*  CL 99*  < > 98*  CO2 23  < > 22  GLUCOSE 108*  < > 65  BUN 21*  < > 41*  CREATININE 1.75*  < > 2.76*  CALCIUM 8.6*  < > 8.3*  AST 23  --   --   ALT 10*  --   --   ALKPHOS 65  --   --   BILITOT 0.5  --   --   < > = values in this interval not displayed. ------------------------------------------------------------------------------------------------------------------  Cardiac Enzymes  Recent Labs Lab 10/15/16 0835  TROPONINI 0.03*   ------------------------------------------------------------------------------------------------------------------  RADIOLOGY:  No results found.   ASSESSMENT AND PLAN:  83 year old female with past medical history of hypertension, hyperlipidemia, chronic systolic CHF, aortic stenosis, coronary artery disease, GERD, osteoarthritis who presented to the hospital due to shortness of breath.  1. CHF-acute on chronic systolic dysfunction. This is the cause of patient's worsening shortness of breath and respiratory failure.  -Improved with IV diuresis. Off lasix and vasotec due to worsening renal failure -Continue metoprolol, appreciate  cardiology input  -follow I's and O's and daily weights. - neg 1.9 liters  2. Aortic stenosis-patient's previous echo shows moderate to severe aortic stenosis. -Given advanced age she is likely not a good surgical candidate for aortic valve replacement. As per cardiology patient has refused aortic valve replacement in the past. - off vasotec due to ARF continue treatment for CHF as mentioned above.  3. Acute on chronic renal failure 2-secondary to diuresis. Hold Lasix, Vasotec for now - creat 1098->2.14->2.23->2.76 -We'll repeat BUN and creatinine tomorrow. - nephro c/s pending - start gentle IVFs  4. History of paroxysmal atrial fibrillation-currently rate controlled. Continue metoprolol, Eliquis.  5. Anxiety-continue Xanax.  6. History of rheumatoid arthritis-continue Plaquenil.  7. Hypothyroidism-Synthroid.  8. Hyperlipidemia-continue Pravachol.   PT recommends HHPT   All the records are reviewed and case discussed with Care Management/Social Worker. Management plans discussed with the patient, family (sister at bedside) and they are in agreement.  CODE STATUS: Partial Code, Palliative care c/s pending  DVT Prophylaxis: Eliquis  TOTAL TIME TAKING CARE OF THIS PATIENT: 25 minutes.   POSSIBLE D/C IN 1-2 DAYS, DEPENDING ON CLINICAL CONDITION.   Max Sane M.D on 10/19/2016 at 11:55 AM  Between 7am to 6pm - Pager - [phone]  After 6pm go to [email or web address] - Proofreader  [facility]  Office  [phone]  CC: Primary care physician; Rusty Aus, MD

## 2016-10-19 NOTE — Plan of Care (Signed)
Problem: Activity: Goal: Capacity to carry out activities will improve Outcome: Not Progressing Continues to report SOB with increased activity.

## 2016-10-20 DIAGNOSIS — R0602 Shortness of breath: Secondary | ICD-10-CM

## 2016-10-20 LAB — CBC
HEMATOCRIT: 34.8 % — AB (ref 35.0–47.0)
HEMOGLOBIN: 11.6 g/dL — AB (ref 12.0–16.0)
MCH: 30 pg (ref 26.0–34.0)
MCHC: 33.2 g/dL (ref 32.0–36.0)
MCV: 90.3 fL (ref 80.0–100.0)
Platelets: 184 10*3/uL (ref 150–440)
RBC: 3.85 MIL/uL (ref 3.80–5.20)
RDW: 16.7 % — ABNORMAL HIGH (ref 11.5–14.5)
WBC: 8.7 10*3/uL (ref 3.6–11.0)

## 2016-10-20 LAB — BASIC METABOLIC PANEL
ANION GAP: 13 (ref 5–15)
BUN: 47 mg/dL — ABNORMAL HIGH (ref 6–20)
CHLORIDE: 102 mmol/L (ref 101–111)
CO2: 20 mmol/L — AB (ref 22–32)
CREATININE: 2.9 mg/dL — AB (ref 0.44–1.00)
Calcium: 8.2 mg/dL — ABNORMAL LOW (ref 8.9–10.3)
GFR calc non Af Amer: 13 mL/min — ABNORMAL LOW (ref >60)
GFR, EST AFRICAN AMERICAN: 15 mL/min — AB (ref >60)
Glucose, Bld: 92 mg/dL (ref 65–99)
POTASSIUM: 5.5 mmol/L — AB (ref 3.5–5.1)
SODIUM: 135 mmol/L (ref 135–145)

## 2016-10-20 MED ORDER — LEVOTHYROXINE SODIUM 50 MCG PO TABS: 50.0000 ug | ORAL_TABLET | Freq: Every day | ORAL | 0 refills | Status: AC

## 2016-10-20 MED ORDER — MORPHINE SULFATE (CONCENTRATE) 10 MG/0.5ML PO SOLN: 2.5000 mg | ORAL | 0 refills | Status: DC | PRN

## 2016-10-20 MED ORDER — ALPRAZOLAM 0.25 MG PO TABS: 0.2500 mg | ORAL_TABLET | Freq: Two times a day (BID) | ORAL | 0 refills | Status: AC | PRN

## 2016-10-20 MED ORDER — MORPHINE SULFATE (CONCENTRATE) 10 MG/0.5ML PO SOLN: 2.5000 mg | ORAL | Status: DC | PRN

## 2016-10-20 MED ORDER — MORPHINE SULFATE (CONCENTRATE) 10 MG/0.5ML PO SOLN: 5.0000 mg | ORAL | 0 refills | Status: AC | PRN

## 2016-10-20 NOTE — Care Management (Signed)
Palliative care met with son and agreeable to home with hospice.  Agency choice is [facility]. Notified [facility] liaison.  Son - Mr Veva Holes does not wish to purse hospital bed at present.  CM discussed that hospice agency would switch out patient's current oxygen which is through [facility], will be switched out at a later date.  Son will transport patient home and says will be able to get patient into the house.  Reached out to [facility] to provide portable tank for transport home

## 2016-10-20 NOTE — Care Management Important Message (Signed)
Important Message  Patient Details  Name: [name] MRN: [id number] Date of Birth: November 25, 1923   Medicare Important Message Given:  Yes Documented late due to Epic shut down   Katrina Stack, RN 10/20/2016, 2:22 PM

## 2016-10-20 NOTE — Discharge Instructions (Signed)
Heart Failure Clinic appointment on November 01, 2016 at 11:20am with [name], [place]. Please call [phone] to reschedule.    Heart Failure Heart failure means your heart has trouble pumping blood. This makes it hard for your body to work well. Heart failure is usually a long-term (chronic) condition. You must take good care of yourself and follow your doctor's treatment plan. Follow these instructions at home:  Take your heart medicine as told by your doctor. ? Do not stop taking medicine unless your doctor tells you to. ? Do not skip any dose of medicine. ? Refill your medicines before they run out. ? Take other medicines only as told by your doctor or pharmacist.  Stay active if told by your doctor. The elderly and people with severe heart failure should talk with a doctor about physical activity.  Eat heart-healthy foods. Choose foods that are without trans fat and are low in saturated fat, cholesterol, and salt (sodium). This includes fresh or frozen fruits and vegetables, fish, lean meats, fat-free or low-fat dairy foods, whole grains, and high-fiber foods. Lentils and dried peas and beans (legumes) are also good choices.  Limit salt if told by your doctor.  Cook in a healthy way. Roast, grill, broil, bake, poach, steam, or stir-fry foods.  Limit fluids as told by your doctor.  Weigh yourself every morning. Do this after you pee (urinate) and before you eat breakfast. Write down your weight to give to your doctor.  Take your blood pressure and write it down if your doctor tells you to.  Ask your doctor how to check your pulse. Check your pulse as told.  Lose weight if told by your doctor.  Stop smoking or chewing tobacco. Do not use gum or patches that help you quit without your doctor's approval.  Schedule and go to doctor visits as told.  Nonpregnant women should have no more than 1 drink a day. Men should have no more than 2 drinks a day. Talk to your doctor about  drinking alcohol.  Stop illegal drug use.  Stay current with shots (immunizations).  Manage your health conditions as told by your doctor.  Learn to manage your stress.  Rest when you are tired.  If it is really hot outside: ? Avoid intense activities. ? Use air conditioning or fans, or get in a cooler place. ? Avoid caffeine and alcohol. ? Wear loose-fitting, lightweight, and light-colored clothing.  If it is really cold outside: ? Avoid intense activities. ? Layer your clothing. ? Wear mittens or gloves, a hat, and a scarf when going outside. ? Avoid alcohol.  Learn about heart failure and get support as needed.  Get help to maintain or improve your quality of life and your ability to care for yourself as needed. Contact a doctor if:  You gain weight quickly.  You are more short of breath than usual.  You cannot do your normal activities.  You tire easily.  You cough more than normal, especially with activity.  You have any or more puffiness (swelling) in areas such as your hands, feet, ankles, or belly (abdomen).  You cannot sleep because it is hard to breathe.  You feel like your heart is beating fast (palpitations).  You get dizzy or light-headed when you stand up. Get help right away if:  You have trouble breathing.  There is a change in mental status, such as becoming less alert or not being able to focus.  You have chest pain or discomfort.  You faint. This information is not intended to replace advice given to you by your health care provider. Make sure you discuss any questions you have with your health care provider. Document Released: 01/06/2008 Document Revised: 09/04/2015 Document Reviewed: 05/15/2012 Elsevier Interactive Patient Education  2017 [employer].

## 2016-10-20 NOTE — Progress Notes (Signed)
Family Meeting Note  Advance Directive:yes  Today a meeting took place with the Patient and [facility] Daughter.  The following clinical team members were present during this meeting:MD and RN  The following were discussed:Patient's diagnosis: , Patient's progosis: < 6 months and Goals for treatment: DNR  Additional follow-up to be provided: Home with Hospice  Time spent during discussion:20 minutes  Max Sane, MD

## 2016-10-20 NOTE — Progress Notes (Signed)
Patient is discharge home in a stable condition summary and f/u care given to son verbalized understanding , left with son

## 2016-10-20 NOTE — Progress Notes (Signed)
Daily Progress Note   Patient Name: [name]       Date: 10/20/2016 DOB: 21-Nov-1923  Age: 83 y.o. MRN#: [id number] Attending Physician: Max Sane, MD Primary Care Physician: Rusty Aus, MD Admit Date: 10/14/2016  Reason for Consultation/Follow-up: Establishing goals of care  Subjective: Patient reports trouble with breathing and anxiety overnight.  She had an "episode".  Son and sister are at bedside. Son [name] is HCPOA.  We discussed Hospice.  He and the patient agree they would like for a Hospice RN to come to the house to treat her symptoms rather than taking her to the ER or the doctors office.    We discussed code status.  Both son and patient agree to "allow natural death".  The do not want intubation or CPR.  "When it's my time I want God to take me.  I'm ready".    Assessment: Very pleasant 84 female with severe aortic stenosis, heart failure and kidney failure.  Does not want surgery.   Patient Profile/HPI:  83 y.o. female with past medical history of systolic congestive heart failure with an EF of 30-35% (Echo done 08/15/2016) , severe aortic stenosis, A. Fib, coronary artery disease,  HTN, HLD who was admitted on 10/14/2016 with congestive heart failure exacerbation. The patient has been admitted 3 times in the past 6 months with the most recent admission being 09/17/2016 with a congestive heart failure exacerbation.   The patient is now a DNR.  Creatinine has been increasing from 1.84 on 10/15/2016 to 2.9 on 10/20/2016.   Length of Stay: 5  Current Medications: Scheduled Meds:  . acetaminophen  500 mg Oral BID  . ALPRAZolam  0.25 mg Oral QHS  . apixaban  2.5 mg Oral Daily  . calcium carbonate  600 mg of elemental calcium Oral BID WC  . cycloSPORINE  1 drop Both Eyes BID   . ferrous sulfate  325 mg Oral Q1200  . hydroxychloroquine  200 mg Oral Daily  . levothyroxine  50 mcg Oral QAC breakfast  . magnesium oxide  400 mg Oral Daily  . metoprolol succinate  12.5 mg Oral Daily  . Naphazoline-Glycerin  1-2 drop Both Eyes BID  . pantoprazole  40 mg Oral Daily  . pravastatin  40 mg Oral QHS  . sodium chloride flush  3 mL Intravenous Q12H  . vitamin B-12  1,000 mcg Oral Daily    Continuous Infusions: . sodium chloride      PRN Meds: sodium chloride, acetaminophen, ALPRAZolam, fentaNYL (SUBLIMAZE) injection, meclizine, ondansetron (ZOFRAN) IV, polyvinyl alcohol, sodium chloride flush  Physical Exam        Thin frail, pleasant elderly female CV regular with 3/6 murmur Abdomen soft, nt, nd Ext.  No edema, able to move all 4.   Vital Signs: BP (!) 136/54 (BP Location: Right Arm)   Pulse 73   Temp 97.6 F (36.4 C) (Oral)   Resp 18   Ht 5\' 3"  (1.6 m)   Wt 40 kg (88 lb 2.2 oz)   SpO2 99%   BMI 15.61 kg/m  SpO2: SpO2: 99 % O2 Device: O2 Device: Nasal Cannula O2 Flow Rate: O2 Flow Rate (L/min): 3 L/min  Intake/output summary:  Intake/Output Summary (Last 24 hours) at 10/20/16 1128 Last data filed at 10/20/16 2440  Gross per 24 hour  Intake          1219.16 ml  Output                0 ml  Net          1219.16 ml   LBM: Last BM Date: 10/19/16 Baseline Weight: Weight: 44 kg (97 lb) Most recent weight: Weight: 40 kg (88 lb 2.2 oz)       Palliative Assessment/Data:    Flowsheet Rows     Most Recent Value  Intake Tab  Referral Department  Hospitalist  Unit at Time of Referral  Cardiac/Telemetry Unit  Palliative Care Primary Diagnosis  Cardiac  Date Notified  10/18/16  Palliative Care Type  Return patient Palliative Care  Reason for referral  Clarify Goals of Care, Counsel Regarding Hospice  Date of Admission  10/14/16  Date first seen by Palliative Care  10/19/16  # of days Palliative referral response time  1 Day(s)  # of days IP prior  to Palliative referral  4  Clinical Assessment  Psychosocial & Spiritual Assessment  Palliative Care Outcomes      Patient Active Problem List   Diagnosis Date Noted  . Palliative care encounter   . Goals of care, counseling/discussion   . Palliative care by specialist   . DNR (do not resuscitate)   . Aortic valve stenosis 09/19/2016  . Essential hypertension 09/19/2016  . CKD (chronic kidney disease), stage IV ([place]) 09/19/2016  . Generalized weakness 09/19/2016  . Hyponatremia 09/19/2016  . Hypokalemia 09/19/2016  . Acute systolic heart failure due to valvular disease ([place]) 09/17/2016  . Acute on chronic systolic heart failure ([place]) 08/15/2016  . S/P CABG x 3 06/13/2012  . Coronary artery disease 06/13/2012  . CONSTIPATION, MILD 05/07/2009  . BACK PAIN, LUMBAR 05/07/2009  . LEG PAIN, BILATERAL 05/07/2009  . VERTIGO 05/16/2008  . COLONIC POLYPS, ADENOMATOUS 09/13/2007  . GERD 04/20/2007  . ANXIETY DISORDER, GENERALIZED 08/29/2006  . SYSTEMIC LUPUS ERYTHEMATOSUS 08/29/2006  . HYPERLIPIDEMIA 08/25/2006  . ANEMIA-NOS 08/25/2006  . HYPERTENSION 08/25/2006  . CAROTID ARTERY DISEASE 08/25/2006  . ALLERGIC RHINITIS 08/25/2006  . [place] SYNDROME 08/25/2006  . OSTEOPENIA 08/23/2006    Palliative Care Plan    Recommendations/Plan:  Home with Hospice  PRN ativan solution.  0.5 mg q 6 hours PRN  Anxiety or insomnia  PRN morphine concentrate solution.  2.5 - 5.0 mg q 4 hours PRN shortness of breath.  Goals of Care and Additional Recommendations:  Limitations on Scope of Treatment: Avoid Hospitalization, Minimize Medications and No Surgical Procedures  Code Status:  DNR - Armandina Gemma form placed on the hard chart.  Prognosis:   < 6 months given  severe aortic stenosis, heart failure and kidney failure and frailty  Discharge Planning:  Home with Hospice  Care plan was discussed with attending physician, and case mgr, family  Thank you for allowing the Palliative  Medicine Team to assist in the care of this patient.  Total time spent:  35 min.     Greater than 50%  of this time was spent counseling and coordinating care related to the above assessment and plan.  Florentina Jenny, PA-C Palliative Medicine  Please contact Palliative MedicineTeam phone at [phone] for questions and concerns between 7 am - 7 pm.   Please see AMION for individual provider pager numbers.

## 2016-10-20 NOTE — Progress Notes (Signed)
New referral for Hospice and [facility] of [place] services at home received from [place] following a Palliative Medicine consult. Patient is a 83 year old woman past medical history of systolic congestive heart failure with an EF of 30-35% (Echo done 08/15/2016) , severe aortic stenosis, A. Fib, coronary artery disease,  HTN and HLD who was admitted on 10/14/2016 with congestive heart failure exacerbation. The patient has been admitted 3 times in the past 6 months with the most recent admission being 09/17/2016 with a congestive heart failure exacerbation. Patient has had increasing BUN and was a partial code. Palliative Medicine was consulted for Goals of Care and met with patient's son Guadlupe Spanish, who has now chosen to focus on his mother's comfort with support of hospice services at her home. She lives alone and has 24 hour caregivers. Writer met in the room with Fritz Pickerel to initiate education regarding hospice services, philosophy and team approach to care with good understanding voiced. Questions answered. Patient remained with her eyes closed thorough out the visit, mumbling at times in her sleep. Oxygen in place at 3 liters, her baseline hs been 2 liters. Plan is for transport home today via car. She has oxygen in place in her home through [facility], a rollator walker, BSC and shower chair. Patient information faxed to referral. Signed DNR in place in patient's chart and will accompany patient home. Hospital care team all aware. Thank you. Flo Shanks RN, BSN, [place] and [facility] [place], [facility] [phone] c

## 2016-10-20 NOTE — Progress Notes (Signed)
Date: 10/20/2016                  Patient Name:  [name]  MRN: [id number]  DOB: 07-Jan-1924  Age / Sex: 83 y.o., female         History of Present Illness: Patient is a 83 y.o. female who was admitted to [facility] on 10/14/2016  She has medical problems of severe congestive heart failure with EF of 30-35%, severe aortic stenosis with valve area of 0.4, atrial fibrillation, coronary disease, hypertension, hyperlipidemia who is admitted with congestive heart failure exacerbation.  No significant improvement in renal function.  Serum creatinine continues to remain elevated at 2.90.  Patient's granddaughter is in the room with her.  Patient denies any acute shortness of breath but still has dyspnea on exertion.  Appetite remains poor.    Medications: Outpatient medications: No prescriptions prior to admission.    Current medications: No current facility-administered medications for this encounter.    Current Outpatient Prescriptions  Medication Sig Dispense Refill  . acetaminophen (TYLENOL) 500 MG tablet Take 500 mg by mouth 2 (two) times daily.    Marland Kitchen ALPRAZolam (XANAX) 0.25 MG tablet Take 0.25 mg by mouth at bedtime.     . ALPRAZolam (XANAX) 0.25 MG tablet Take 1 tablet (0.25 mg total) by mouth 2 (two) times daily as needed for anxiety (panic attacks). 10 tablet 0  . apixaban (ELIQUIS) 2.5 MG TABS tablet Take 1 tablet by mouth 2 (two) times daily.     . ferrous sulfate 325 (65 FE) MG tablet Take 325 mg by mouth daily at 12 noon.    . hydroxychloroquine (PLAQUENIL) 200 MG tablet Take 1 tablet by mouth daily.    . hydroxypropyl methylcellulose (ISOPTO TEARS) 2.5 % ophthalmic solution Place 1 drop into both eyes 2 (two) times daily as needed (dry eyes).    Derrill Memo ON 10/21/2016] levothyroxine (SYNTHROID, LEVOTHROID) 50 MCG tablet Take 1 tablet (50 mcg total) by mouth daily before breakfast. 30 tablet 0  . magnesium hydroxide (MILK OF MAGNESIA) 400 MG/5ML suspension Take 10 mLs by mouth daily as  needed for constipation.    . magnesium oxide (MAG-OX) 400 (241.3 Mg) MG tablet Take 1 tablet (400 mg total) by mouth daily. 30 tablet 3  . metoprolol succinate (TOPROL-XL) 25 MG 24 hr tablet Take 25 mg by mouth daily.    . Morphine Sulfate (MORPHINE CONCENTRATE) 10 MG/0.5ML SOLN concentrated solution Take 0.25 mLs (5 mg total) by mouth every 4 (four) hours as needed for shortness of breath. 30 mL 0  . polyethylene glycol powder (GLYCOLAX/MIRALAX) powder         Vital Signs: Blood pressure (!) 120/55, pulse 73, temperature 98 F (36.7 C), resp. rate 18, height 5\' 3"  (1.6 m), weight 40 kg (88 lb 2.2 oz), SpO2 99 %.   Intake/Output Summary (Last 24 hours) at 10/20/16 1745 Last data filed at 10/20/16 0620  Gross per 24 hour  Intake           979.16 ml  Output                0 ml  Net           979.16 ml    Weight trends: Filed Weights   10/18/16 0513 10/19/16 0511 10/20/16 0333  Weight: 41.9 kg (92 lb 4.8 oz) 41.1 kg (90 lb 9.6 oz) 40 kg (88 lb 2.2 oz)    Physical Exam: General: frail, thin, lying in  the bed  HEENT Anicteric, dry oral mucous membranes  Neck:  supple  Lungs: Bibasilar mild crackles,   Heart:: Irregular, prominent crescendo systolic murmur  Abdomen: Soft, nontender  Extremities:  no edema  Neurologic: Lethargic but able to answer questions  Skin: Decreased turgor, no acute rashes             Lab results: Basic Metabolic Panel:  Recent Labs Lab 10/18/16 0716 10/19/16 0453 10/20/16 0410  NA 131* 132* 135  K 4.9 5.5* 5.5*  CL 98* 98* 102  CO2 23 22 20*  GLUCOSE 87 65 92  BUN 29* 41* 47*  CREATININE 2.23* 2.76* 2.90*  CALCIUM 8.5* 8.3* 8.2*    Liver Function Tests:  Recent Labs Lab 10/14/16 2039  AST 23  ALT 10*  ALKPHOS 65  BILITOT 0.5  PROT 7.3  ALBUMIN 3.3*   No results for input(s): LIPASE, AMYLASE in the last 168 hours. No results for input(s): AMMONIA in the last 168 hours.  CBC:  Recent Labs Lab 10/14/16 2039   10/19/16 0453 10/20/16 0410  WBC 5.7  < > 9.4 8.7  NEUTROABS 4.0  --   --   --   HGB 11.4*  < > 11.1* 11.6*  HCT 34.6*  < > 33.5* 34.8*  MCV 91.2  < > 88.0 90.3  PLT 180  < > 174 184  < > = values in this interval not displayed.  Cardiac Enzymes:  Recent Labs Lab 10/15/16 0835  TROPONINI 0.03*    BNP: Invalid input(s): POCBNP  CBG: No results for input(s): GLUCAP in the last 168 hours.  Microbiology: No results found for this or any previous visit (from the past 720 hour(s)).   Coagulation Studies: No results for input(s): LABPROT, INR in the last 72 hours.  Urinalysis: No results for input(s): COLORURINE, LABSPEC, PHURINE, GLUCOSEU, HGBUR, BILIRUBINUR, KETONESUR, PROTEINUR, UROBILINOGEN, NITRITE, LEUKOCYTESUR in the last 72 hours.  Invalid input(s): APPERANCEUR      Imaging: No results found.   Assessment & Plan: Pt is a 83 y.o.   female admitted on 10/14/2016 . She has severe congestive heart failure with EF of 30-35%, severe aortic stenosis with valve area of 0.4, atrial fibrillation, coronary disease, hypertension, hyperlipidemia who is admitted with congestive heart failure exacerbation.  1.  Acute renal failure, chronic kidney disease stage IV.  Baseline 1.5/GFR 28 Patient has progressively worsening creatinine.  It is likely secondary to cardiorenal syndrome.  Patient has severe aortic stenosis which is likely limiting her cardiac output. - use Lasix as needed for pulmonary congestion but avoid hypotension  2.  Hyperkalemia - Discontinue potassium chloride supplementation - Monitor closely . 3.  Severe chronic systolic congestive heart failure with severe aortic stenosis Patient has been evaluated by Dr. Ubaldo Glassing. Discussions for TAVR procedure are ongoing but she is considered to be high risk    Discussed with patient's son and granddaughter.  Family is leaning towards hospice.

## 2016-10-21 MED FILL — Polyvinyl Alcohol Ophth Soln 1.4%: OPHTHALMIC | Qty: 15 | Status: AC

## 2016-10-21 NOTE — Discharge Summary (Signed)
[facility] NAME: [name]    MR#:  [id number]  DATE OF BIRTH:  09-11-1923  DATE OF ADMISSION:  10/14/2016   ADMITTING PHYSICIAN: Harvie Bridge, DO  DATE OF DISCHARGE: 10/20/2016  1:53 PM  PRIMARY CARE PHYSICIAN: Rusty Aus, MD   ADMISSION DIAGNOSIS:  Congestive heart failure, unspecified HF chronicity, unspecified heart failure type ([place]) [I50.9] DISCHARGE DIAGNOSIS:  Active Problems:   Acute systolic heart failure due to valvular disease [facility])   Palliative care encounter   Goals of care, counseling/discussion   Shortness of breath  SECONDARY DIAGNOSIS:   Past Medical History:  Diagnosis Date  . Allergic rhinitis   . Anemia   . Anginal pain ([place])   . Coronary artery disease   . GERD (gastroesophageal reflux disease)   . Heart murmur   . Hyperlipidemia   . Hypertension   . Osteoarthritis of hand   . Osteopenia   . Recurrent pleural effusion on left   . Shortness of breath    "since the heart OR in 05/2012 cause of fluid" (08/17/2012)   HOSPITAL COURSE:  83 y.o.femalewith past medical history of hypertension, hyperlipidemia, chronic systolic congestive heart failure with an EF of 30-35% (Echo done 08/15/2016) , severe aortic stenosis, A. Fib, GERD, coronary artery disease, HTN, HLDwho was admitted on 7/5/2018with congestive heart failure exacerbation. The patient has been admitted 3 times in the past 6 months with the most recent admission being 09/17/2016 with a congestive heart failure exacerbation.    1. CHF-acute on chronic systolic dysfunction. This is the cause of patient's worsening shortness of breath and respiratory failure.  -Improved with IV diuresis. Off lasix and vasotec due to worsening renal failure - neg 1.9 liters  2. Aortic stenosis-patient's previous echo shows moderate to severe aortic stenosis. -Given advanced age she is likely not a good surgical candidate for aortic valve  replacement. As per cardiology patient has refused aortic valve replacement in the past. - off vasotec due to ARF continue treatment for CHF as mentioned above.  3. Acute on chronic renal failure 2-secondary to diuresis. Hold Lasix, Vasotec for now - creat 1098->2.14->2.23->2.9  4. History of paroxysmal atrial fibrillation 5. Anxiety 6. History of rheumatoid arthritis 7. Hypothyroidism 8. Hyperlipidemia  The patient is now a DNR.  Creatinine has been increasing from 1.84 on 10/15/2016 to 2.9 on 10/20/2016.  Home with Hospice  PRN ativan solution.  0.5 mg q 6 hours PRN  Anxiety or insomnia  PRN morphine concentrate solution.  2.5 - 5.0 mg q 4 hours PRN shortness of breath.  DISCHARGE CONDITIONS:  fair CONSULTS OBTAINED:  Treatment Team:  Teodoro Spray, MD DRUG ALLERGIES:   Allergies  Allergen Reactions  . Alendronate Sodium Other (See Comments)    REACTION: GI upset  . Simvastatin Other (See Comments)    Feet feel heavy   DISCHARGE MEDICATIONS:   Allergies as of 10/20/2016      Reactions   Alendronate Sodium Other (See Comments)   REACTION: GI upset   Simvastatin Other (See Comments)   Feet feel heavy      Medication List    STOP taking these medications   calcium carbonate 1500 (600 Ca) MG Tabs tablet Commonly known as:  OSCAL   furosemide 40 MG tablet Commonly known as:  LASIX   potassium chloride SA 20 MEQ tablet Commonly known as:  K-DUR,KLOR-CON   vitamin B-12 1000 MCG tablet Commonly known as:  CYANOCOBALAMIN     TAKE these medications   acetaminophen 500 MG tablet Commonly known as:  TYLENOL Take 500 mg by mouth 2 (two) times daily.   ALPRAZolam 0.25 MG tablet Commonly known as:  XANAX Take 1 tablet (0.25 mg total) by mouth 2 (two) times daily as needed for anxiety (panic attacks). What changed:  You were already taking a medication with the same name, and this prescription was added. Make sure you understand how and when to take each.     ALPRAZolam 0.25 MG tablet Commonly known as:  XANAX Take 0.25 mg by mouth at bedtime. What changed:  Another medication with the same name was added. Make sure you understand how and when to take each.   apixaban 2.5 MG Tabs tablet Commonly known as:  ELIQUIS Take 1 tablet by mouth 2 (two) times daily.   ferrous sulfate 325 (65 FE) MG tablet Take 325 mg by mouth daily at 12 noon.   hydroxychloroquine 200 MG tablet Commonly known as:  PLAQUENIL Take 1 tablet by mouth daily.   hydroxypropyl methylcellulose / hypromellose 2.5 % ophthalmic solution Commonly known as:  ISOPTO TEARS / GONIOVISC Place 1 drop into both eyes 2 (two) times daily as needed (dry eyes).   levothyroxine 50 MCG tablet Commonly known as:  SYNTHROID, LEVOTHROID Take 1 tablet (50 mcg total) by mouth daily before breakfast.   magnesium hydroxide 400 MG/5ML suspension Commonly known as:  MILK OF MAGNESIA Take 10 mLs by mouth daily as needed for constipation.   magnesium oxide 400 (241.3 Mg) MG tablet Commonly known as:  MAG-OX Take 1 tablet (400 mg total) by mouth daily.   metoprolol succinate 25 MG 24 hr tablet Commonly known as:  TOPROL-XL Take 25 mg by mouth daily.   morphine CONCENTRATE 10 MG/0.5ML Soln concentrated solution Take 0.25 mLs (5 mg total) by mouth every 4 (four) hours as needed for shortness of breath.   polyethylene glycol powder powder Commonly known as:  GLYCOLAX/MIRALAX        DISCHARGE INSTRUCTIONS:   DIET:  Regular diet DISCHARGE CONDITION:  Fair ACTIVITY:  Activity as tolerated OXYGEN:  Home Oxygen: No.  Oxygen Delivery: room air DISCHARGE LOCATION:  home with Hospice  If you experience worsening of your admission symptoms, develop shortness of breath, life threatening emergency, suicidal or homicidal thoughts you must seek medical attention immediately by calling 911 or calling your MD immediately  if symptoms less severe.  You Must read complete  instructions/literature along with all the possible adverse reactions/side effects for all the Medicines you take and that have been prescribed to you. Take any new Medicines after you have completely understood and accpet all the possible adverse reactions/side effects.   Please note  You were cared for by a hospitalist during your hospital stay. If you have any questions about your discharge medications or the care you received while you were in the hospital after you are discharged, you can call the unit and asked to speak with the hospitalist on call if the hospitalist that took care of you is not available. Once you are discharged, your primary care physician will handle any further medical issues. Please note that NO REFILLS for any discharge medications will be authorized once you are discharged, as it is imperative that you return to your primary care physician (or establish a relationship with a primary care physician if you do not have one) for your aftercare needs so that they can reassess your need for  medications and monitor your lab values.    On the day of Discharge:  VITAL SIGNS:  Blood pressure (!) 120/55, pulse 73, temperature 98 F (36.7 C), resp. rate 18, height 5\' 3"  (1.6 m), weight 40 kg (88 lb 2.2 oz), SpO2 99 %. PHYSICAL EXAMINATION:  GENERAL:  83 y.o.-year-old thin frail patient lying in the bed with no acute distress.  EYES: Pupils equal, round, reactive to light and accommodation. No scleral icterus. Extraocular muscles intact.  HEENT: Head atraumatic, normocephalic. Oropharynx and nasopharynx clear.  NECK:  Supple, no jugular venous distention. No thyroid enlargement, no tenderness.  LUNGS: Normal breath sounds bilaterally, no wheezing, rales,rhonchi or crepitation. No use of accessory muscles of respiration.  CARDIOVASCULAR: S1, S2 normal. 3/6 SE murmurs, NO rubs, or gallops.  ABDOMEN: Soft, non-tender, non-distended. Bowel sounds present. No organomegaly or mass.    EXTREMITIES: No pedal edema, cyanosis, or clubbing.  NEUROLOGIC: Cranial nerves II through XII are intact. Muscle strength 5/5 in all extremities. Sensation intact. Gait not checked.  PSYCHIATRIC: The patient is alert and oriented x 3.  SKIN: No obvious rash, lesion, or ulcer.  DATA REVIEW:   CBC  Recent Labs Lab 10/20/16 0410  WBC 8.7  HGB 11.6*  HCT 34.8*  PLT 184    Chemistries   Recent Labs Lab 10/20/16 0410  NA 135  K 5.5*  CL 102  CO2 20*  GLUCOSE 92  BUN 47*  CREATININE 2.90*  CALCIUM 8.2*     Follow-up Information    [place]. Go on 11/01/2016.   Specialty:  Cardiology Why:  at 11:20am  Contact information: [place] [address]       Rusty Aus, MD. Go on 10/27/2016.   Specialty:  Internal Medicine Why:  Time: 8:30 a.m. Contact information: [place] [address] [phone]          Management plans discussed with the patient, family and they are in agreement.  CODE STATUS: DNR   TOTAL TIME TAKING CARE OF THIS PATIENT: 45 minutes.    Max Sane M.D on 10/21/2016 at 10:35 PM  Between 7am to 6pm - Pager - [phone]  After 6pm go to [email or web address] - Proofreader  [facility]  Office  [phone]  CC: Primary care physician; Rusty Aus, MD   Note: This dictation was prepared with Dragon dictation along with smaller phrase technology. Any transcriptional errors that result from this process are unintentional.

## 2016-10-27 DIAGNOSIS — I350 Nonrheumatic aortic (valve) stenosis: Secondary | ICD-10-CM | POA: Diagnosis not present

## 2016-10-27 DIAGNOSIS — I130 Hypertensive heart and chronic kidney disease with heart failure and stage 1 through stage 4 chronic kidney disease, or unspecified chronic kidney disease: Secondary | ICD-10-CM | POA: Diagnosis not present

## 2016-10-27 DIAGNOSIS — N189 Chronic kidney disease, unspecified: Secondary | ICD-10-CM | POA: Diagnosis not present

## 2016-10-27 DIAGNOSIS — I509 Heart failure, unspecified: Secondary | ICD-10-CM | POA: Diagnosis not present

## 2016-11-01 ENCOUNTER — Ambulatory Visit: Payer: Medicare HMO | Admitting: Family

## 2016-11-10 DEATH — deceased

## 2018-11-02 IMAGING — CR DG CHEST 2V
1 series · 2 of 2 positions shown · non-contrast
Comparison: 08/14/2013.

CLINICAL DATA: Tachycardia.

EXAM:
CHEST  2 VIEW

[Series 1: dg chest 2 view · 0.14mm/px · 2 of 2 slices shown]
[im 1/2]
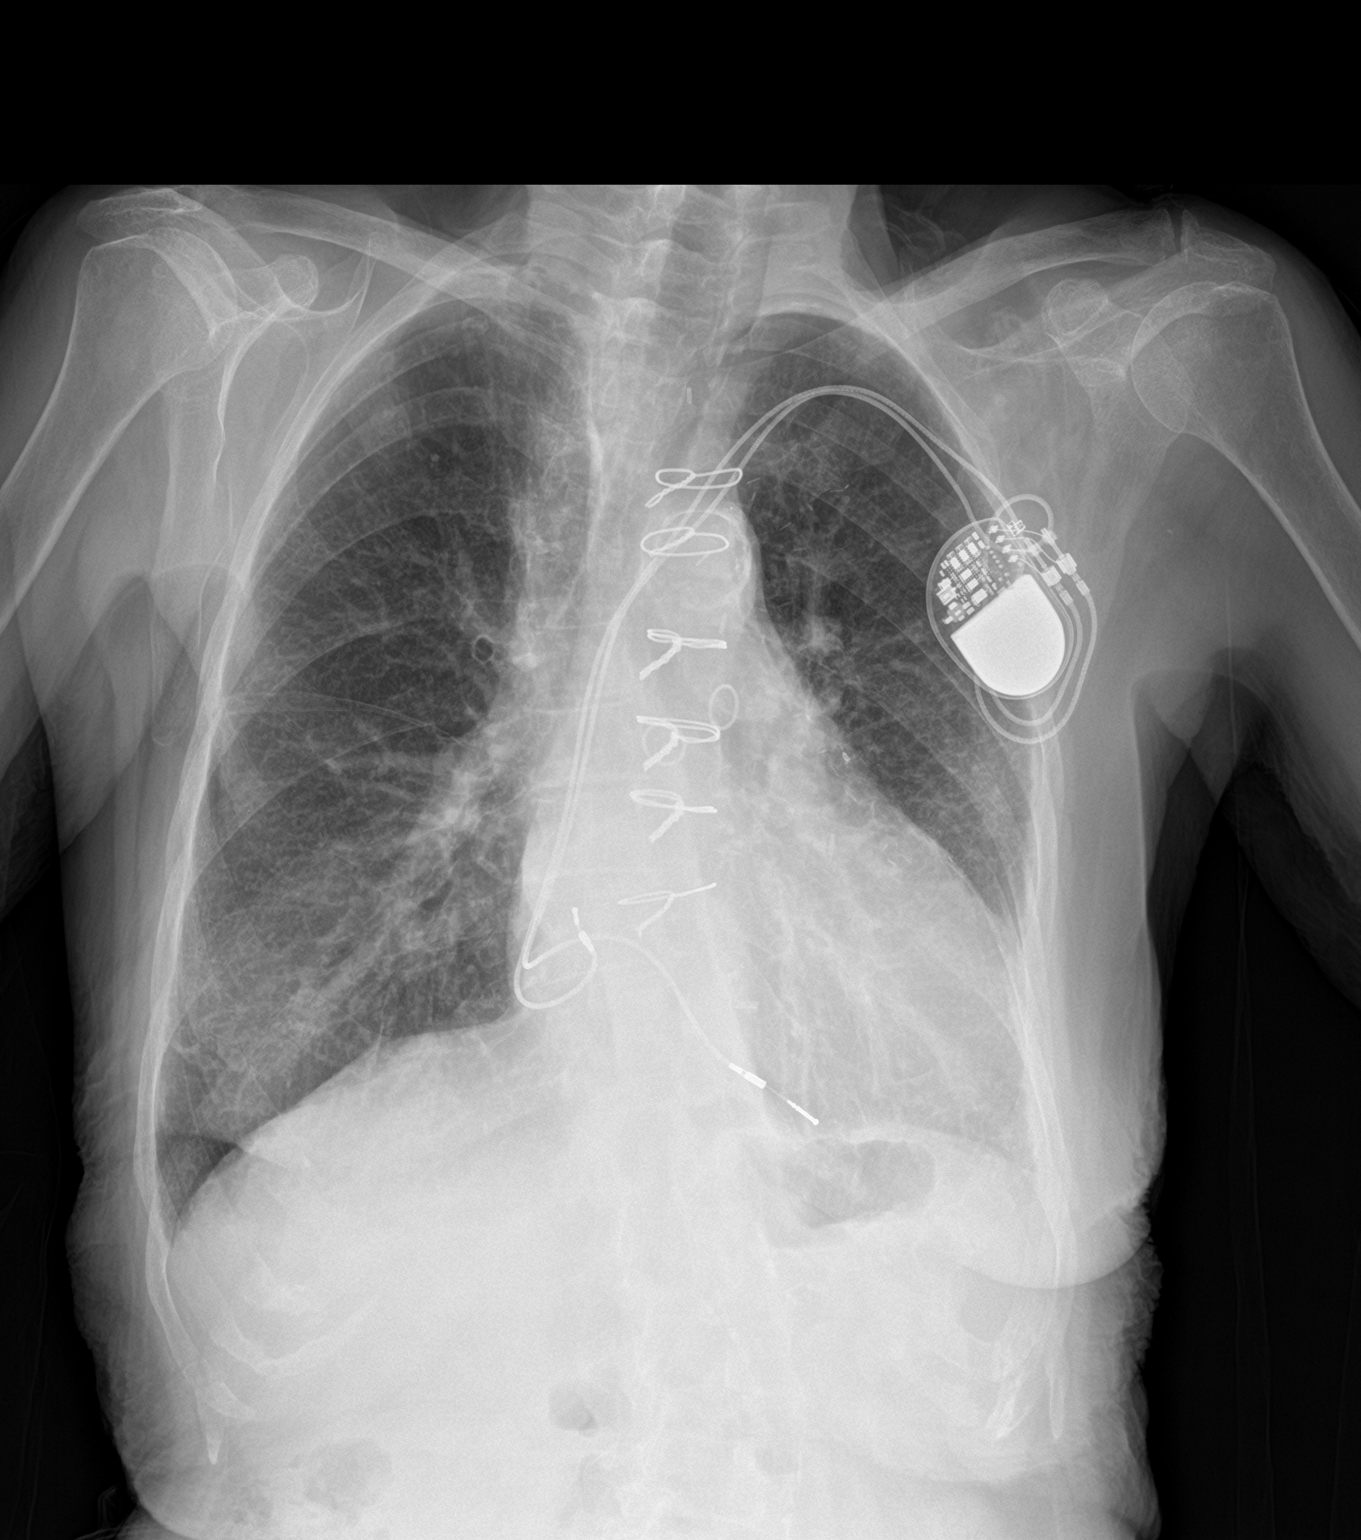
[im 2/2]
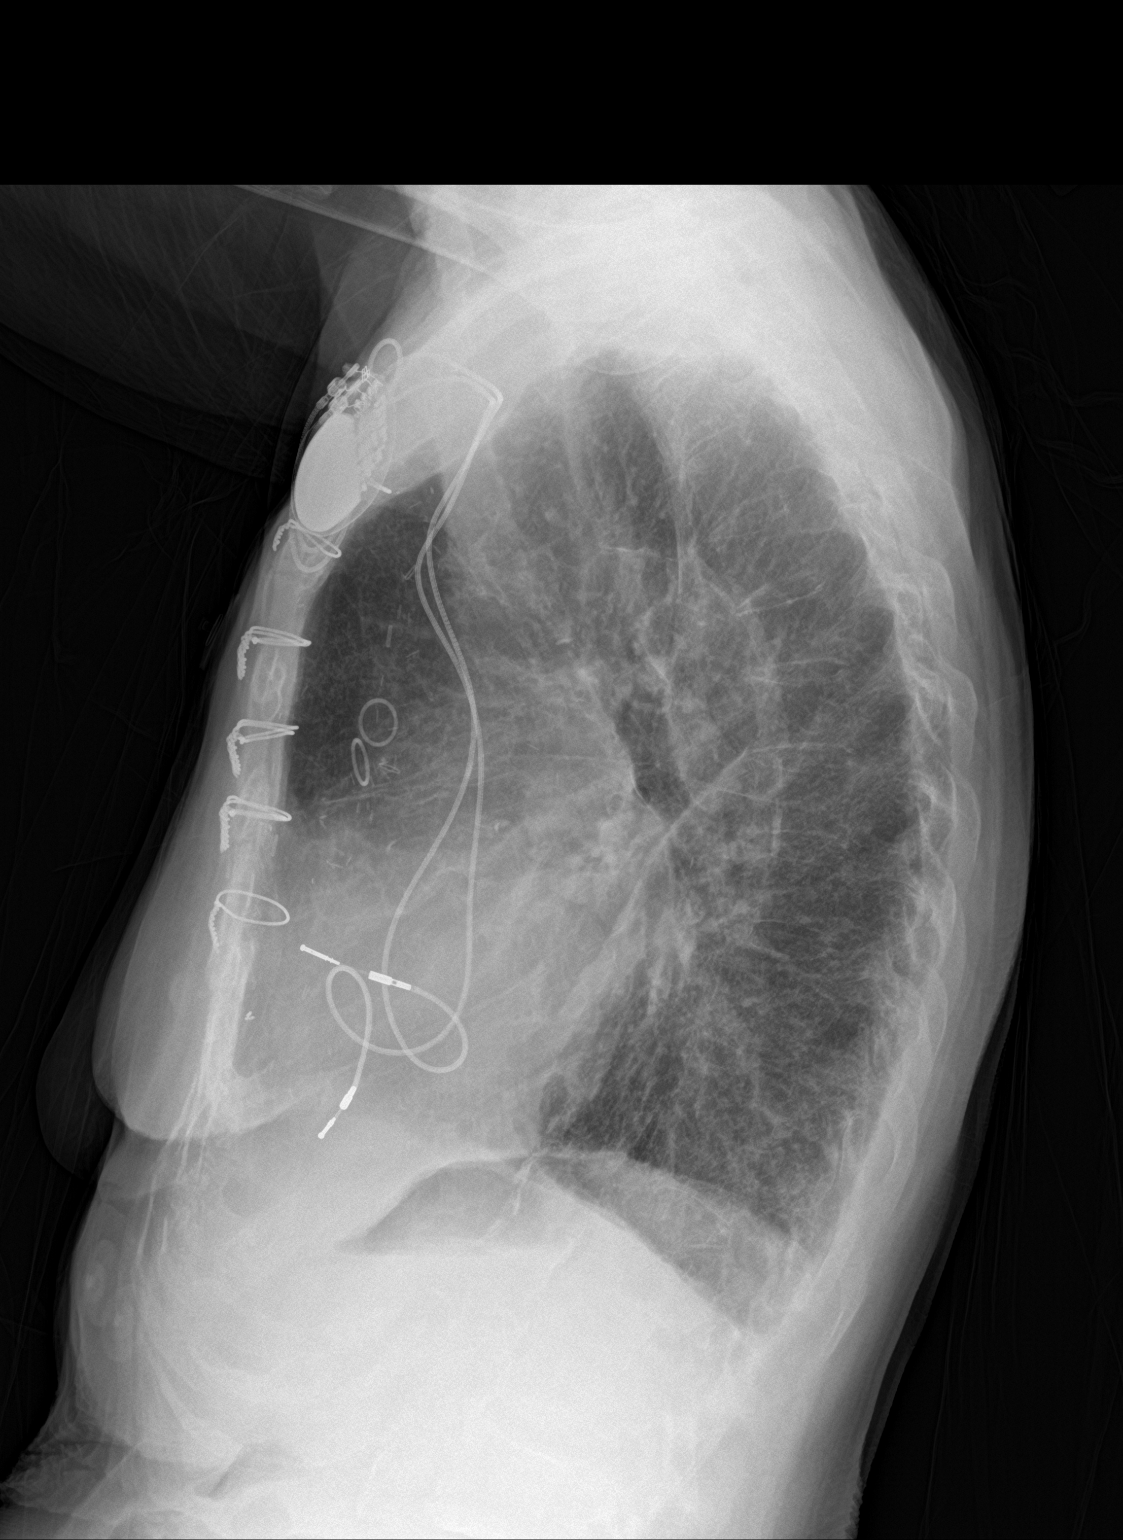

[2 of 2 positions shown; findings below may reference images not displayed]

FINDINGS: Lungs are hyperexpanded. The cardio pericardial silhouette is
enlarged. The lungs are clear wiithout focal pneumonia, edema,
pneumothorax or pleural effusion. Patient is status post CABG.
Left-sided dual lead permanent pacemaker again noted. Bones are
diffusely demineralized.
IMPRESSION: The cardiomegaly with hyperinflation. No acute cardiopulmonary
findings.

## 2018-12-29 IMAGING — CR DG CHEST 2V
1 series · 2 of 2 positions shown · non-contrast
Comparison: 08/14/2016 and prior exams

CLINICAL DATA: Shortness of breath and weakness today.

EXAM:
CHEST  2 VIEW

[Series 1: dg chest 2 view · 0.14mm/px · 2 of 2 slices shown]
[im 1/2]
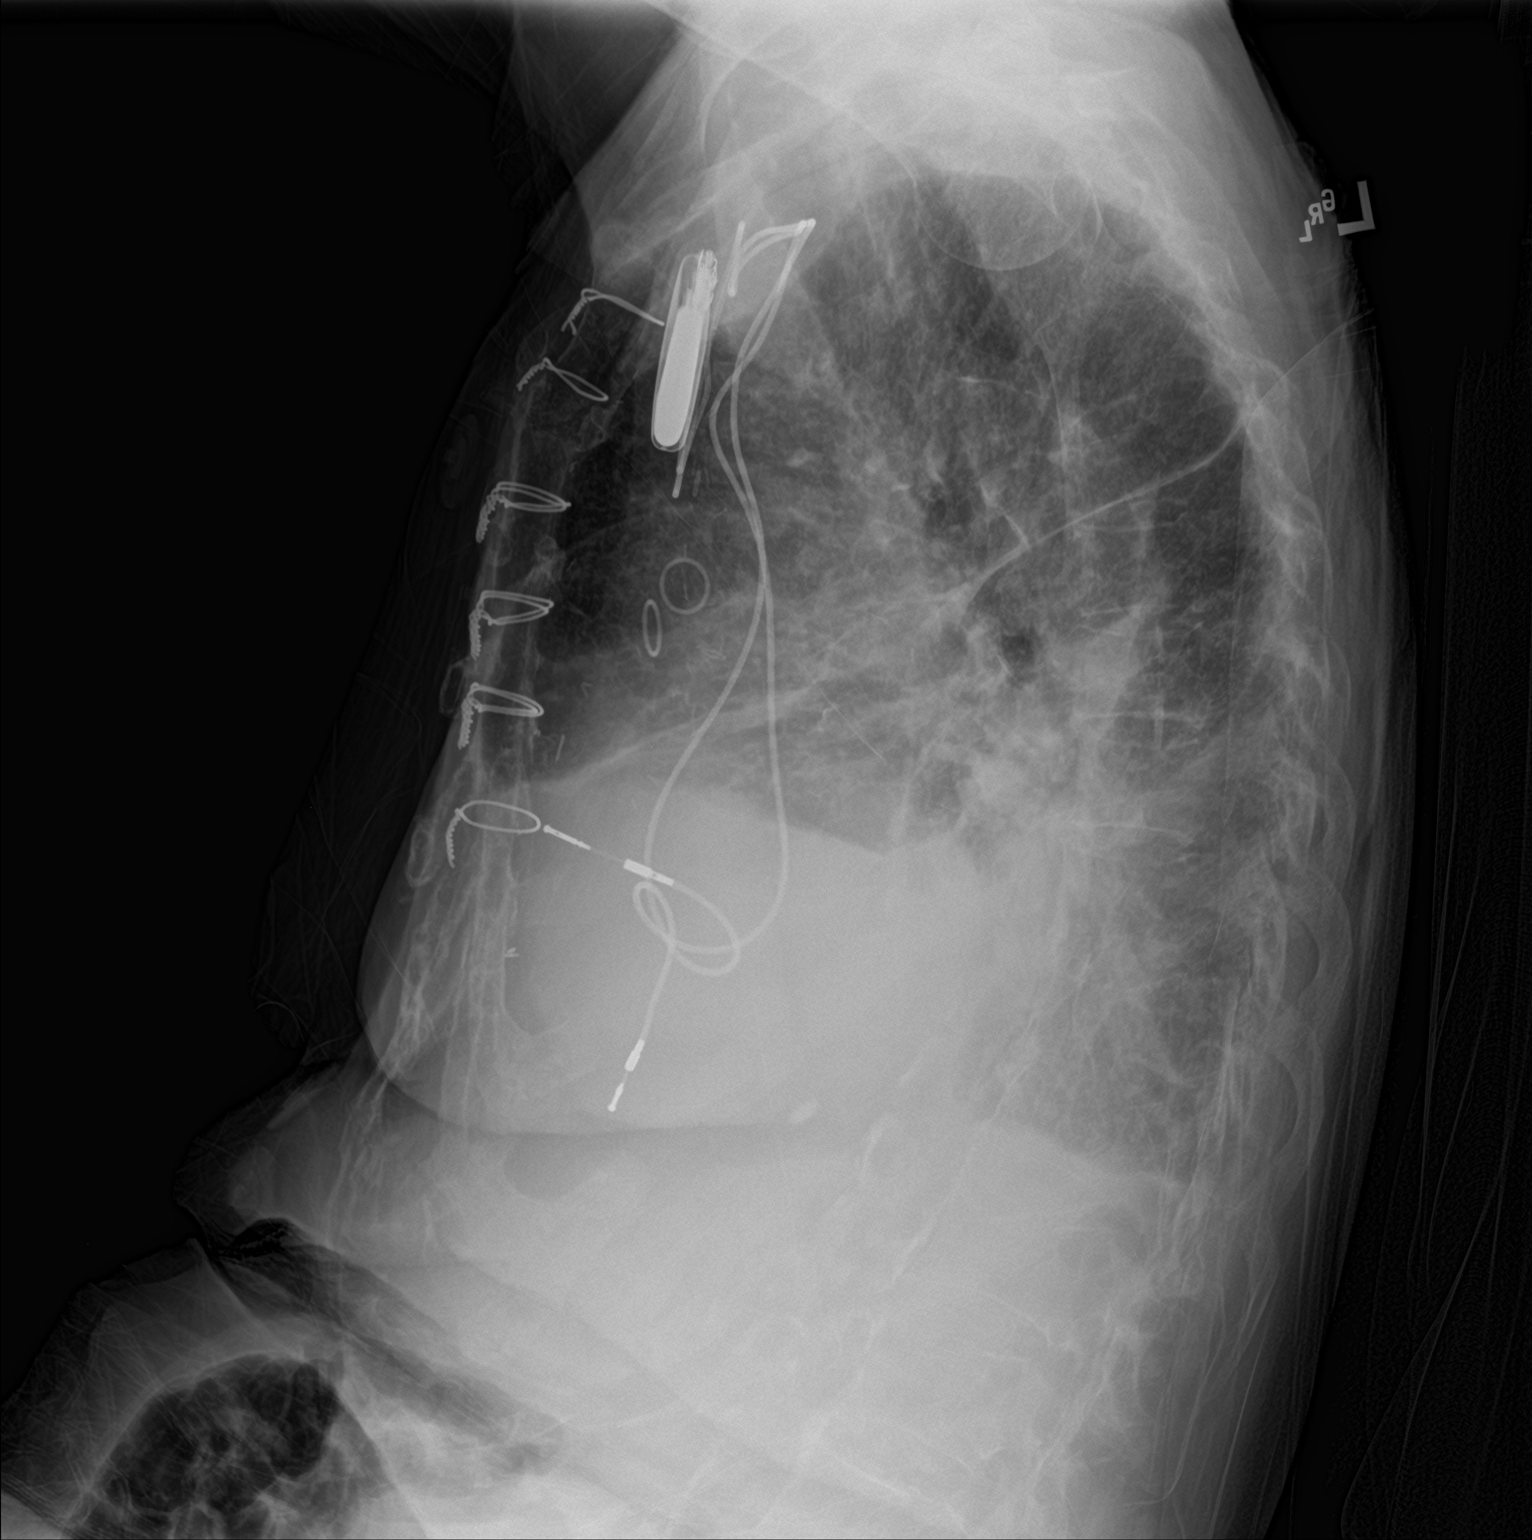
[im 2/2]
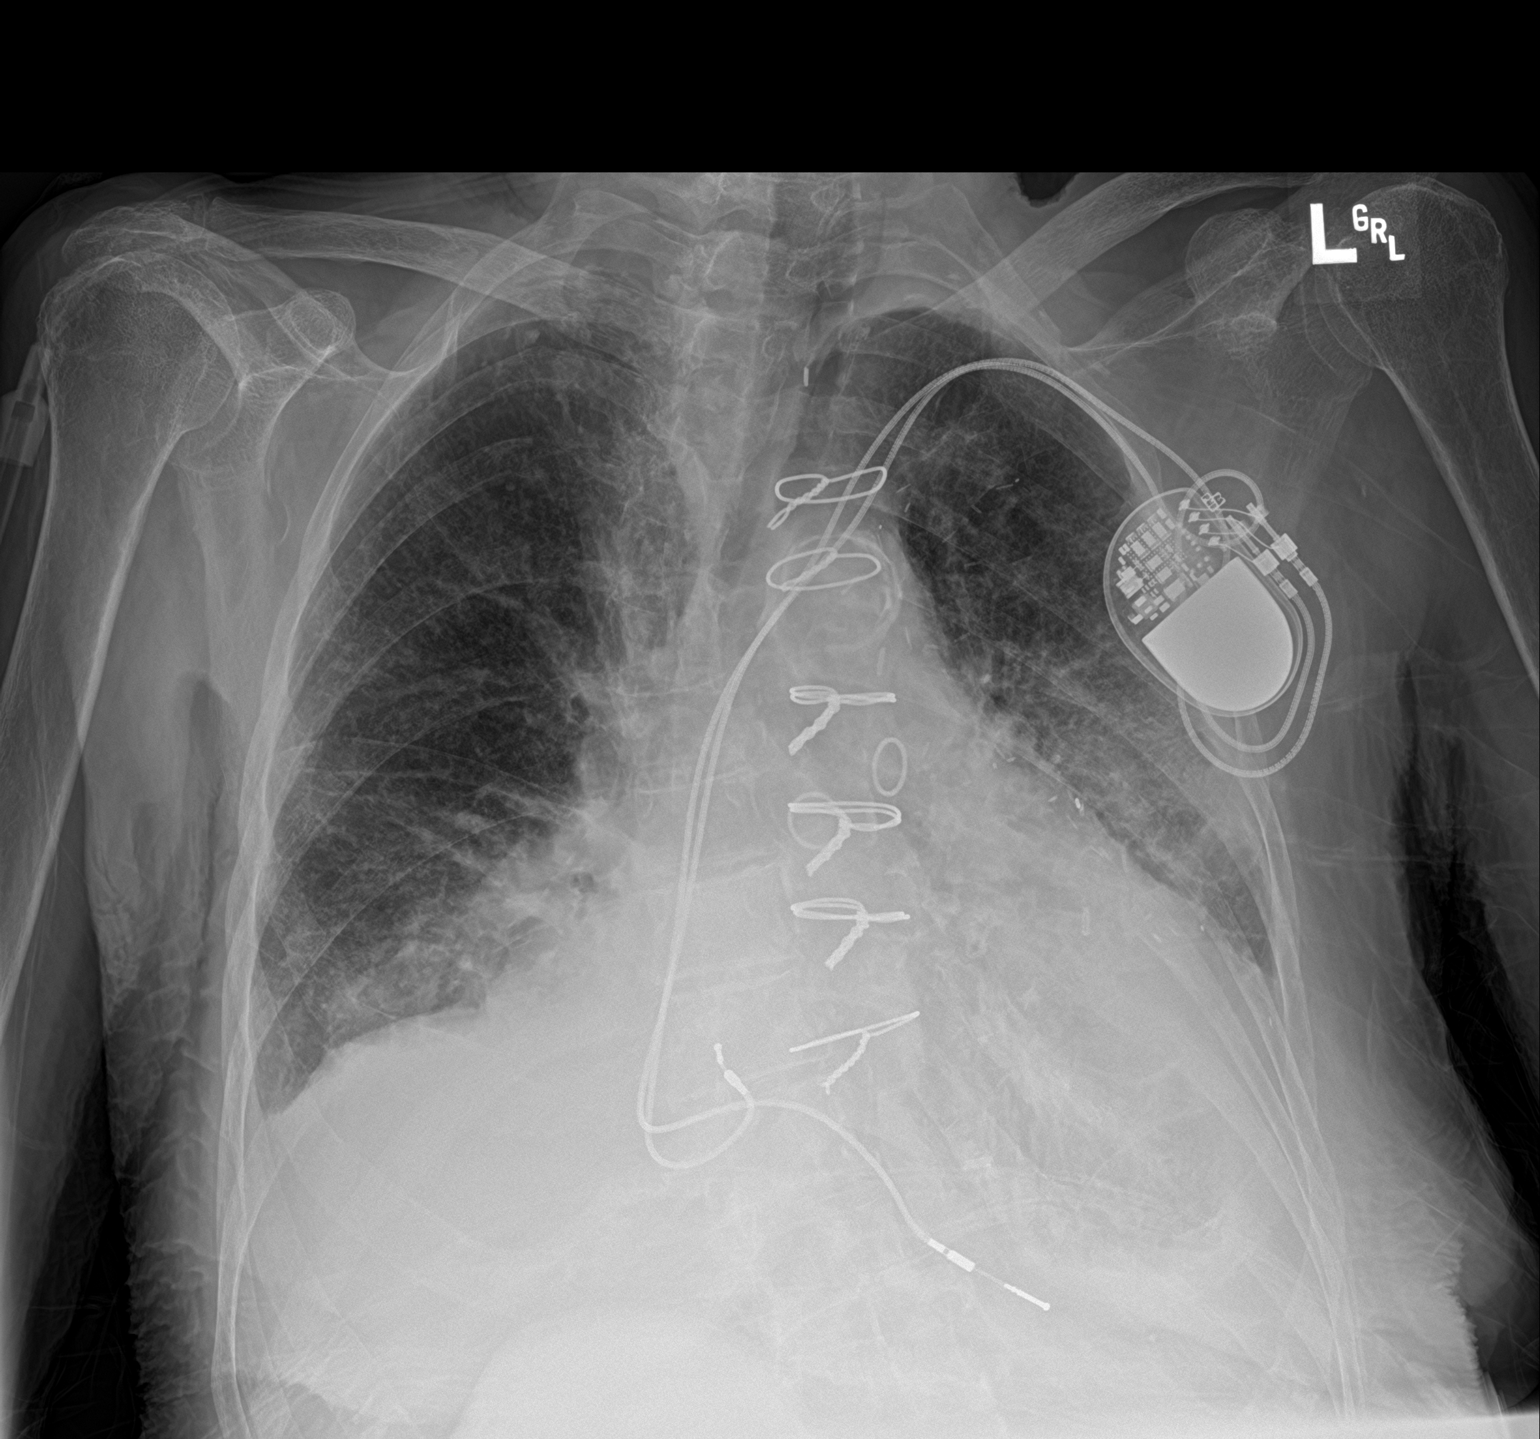

[2 of 2 positions shown; findings below may reference images not displayed]

FINDINGS: Cardiomegaly noted.

Mild pulmonary vascular congestion present.

Right pleural effusion and right basilar atelectasis noted.

New airspace opacities/atelectasis within the lower left lung noted.
There is no evidence of pneumothorax.
IMPRESSION: New right pleural effusion and right basilar atelectasis and left
lower lung airspace disease/ atelectasis.

Cardiomegaly and mild pulmonary vascular congestion.
